# Patient Record
Sex: Female | Born: 1955
Health system: Southern US, Community
[De-identification: ages and names within clinical notes are randomized; demographics above are authoritative.]

## PROBLEM LIST (undated history)

## (undated) DIAGNOSIS — F419 Anxiety disorder, unspecified: Secondary | ICD-10-CM

## (undated) DIAGNOSIS — M797 Fibromyalgia: Secondary | ICD-10-CM

## (undated) DIAGNOSIS — F32A Depression, unspecified: Secondary | ICD-10-CM

## (undated) DIAGNOSIS — K759 Inflammatory liver disease, unspecified: Secondary | ICD-10-CM

## (undated) DIAGNOSIS — J189 Pneumonia, unspecified organism: Secondary | ICD-10-CM

## (undated) DIAGNOSIS — K219 Gastro-esophageal reflux disease without esophagitis: Secondary | ICD-10-CM

## (undated) DIAGNOSIS — M1990 Unspecified osteoarthritis, unspecified site: Secondary | ICD-10-CM

## (undated) DIAGNOSIS — J45909 Unspecified asthma, uncomplicated: Secondary | ICD-10-CM

## (undated) DIAGNOSIS — I10 Essential (primary) hypertension: Secondary | ICD-10-CM

## (undated) HISTORY — PX: TONSILLECTOMY: SUR1361

## (undated) HISTORY — PX: ANKLE SURGERY: SHX546

---

## 2013-11-27 ENCOUNTER — Other Ambulatory Visit: Payer: Self-pay | Admitting: *Deleted

## 2013-11-27 MED ORDER — SULFAMETHOXAZOLE-TMP DS 800-160 MG PO TABS: ORAL_TABLET | ORAL | Status: DC

## 2013-11-27 MED ORDER — MUPIROCIN 2 % EX OINT: TOPICAL_OINTMENT | CUTANEOUS | Status: DC

## 2013-11-27 MED ORDER — CHLORHEXIDINE GLUCONATE 4 % EX LIQD: CUTANEOUS | Status: DC

## 2014-01-27 ENCOUNTER — Encounter: Payer: Self-pay | Admitting: Internal Medicine

## 2015-06-04 DIAGNOSIS — M170 Bilateral primary osteoarthritis of knee: Secondary | ICD-10-CM | POA: Diagnosis not present

## 2015-06-04 DIAGNOSIS — M93271 Osteochondritis dissecans, right ankle and joints of right foot: Secondary | ICD-10-CM | POA: Diagnosis not present

## 2015-06-11 DIAGNOSIS — M170 Bilateral primary osteoarthritis of knee: Secondary | ICD-10-CM | POA: Diagnosis not present

## 2015-06-22 DIAGNOSIS — M170 Bilateral primary osteoarthritis of knee: Secondary | ICD-10-CM | POA: Diagnosis not present

## 2015-08-10 DIAGNOSIS — K080 Exfoliation of teeth due to systemic causes: Secondary | ICD-10-CM | POA: Diagnosis not present

## 2015-08-24 DIAGNOSIS — E785 Hyperlipidemia, unspecified: Secondary | ICD-10-CM | POA: Diagnosis not present

## 2015-09-07 DIAGNOSIS — F330 Major depressive disorder, recurrent, mild: Secondary | ICD-10-CM | POA: Diagnosis not present

## 2015-11-23 DIAGNOSIS — Z1231 Encounter for screening mammogram for malignant neoplasm of breast: Secondary | ICD-10-CM | POA: Diagnosis not present

## 2015-12-01 DIAGNOSIS — H1013 Acute atopic conjunctivitis, bilateral: Secondary | ICD-10-CM | POA: Diagnosis not present

## 2015-12-14 DIAGNOSIS — R928 Other abnormal and inconclusive findings on diagnostic imaging of breast: Secondary | ICD-10-CM | POA: Diagnosis not present

## 2015-12-14 DIAGNOSIS — R922 Inconclusive mammogram: Secondary | ICD-10-CM | POA: Diagnosis not present

## 2016-02-08 DIAGNOSIS — K080 Exfoliation of teeth due to systemic causes: Secondary | ICD-10-CM | POA: Diagnosis not present

## 2016-02-11 DIAGNOSIS — M25571 Pain in right ankle and joints of right foot: Secondary | ICD-10-CM | POA: Diagnosis not present

## 2016-02-11 DIAGNOSIS — M170 Bilateral primary osteoarthritis of knee: Secondary | ICD-10-CM | POA: Diagnosis not present

## 2016-02-26 DIAGNOSIS — S61219A Laceration without foreign body of unspecified finger without damage to nail, initial encounter: Secondary | ICD-10-CM | POA: Diagnosis not present

## 2016-03-22 ENCOUNTER — Encounter (HOSPITAL_COMMUNITY): Payer: Self-pay | Admitting: *Deleted

## 2016-03-22 ENCOUNTER — Ambulatory Visit (HOSPITAL_COMMUNITY)
Admission: EM | Admit: 2016-03-22 | Discharge: 2016-03-22 | Disposition: A | Discharge status: Home / Self Care | Payer: PRIVATE HEALTH INSURANCE | Attending: Family Medicine | Admitting: Family Medicine

## 2016-03-22 ENCOUNTER — Ambulatory Visit (INDEPENDENT_AMBULATORY_CARE_PROVIDER_SITE_OTHER): Payer: PRIVATE HEALTH INSURANCE

## 2016-03-22 DIAGNOSIS — T148XXA Other injury of unspecified body region, initial encounter: Secondary | ICD-10-CM | POA: Diagnosis not present

## 2016-03-22 DIAGNOSIS — S20212A Contusion of left front wall of thorax, initial encounter: Secondary | ICD-10-CM | POA: Diagnosis not present

## 2016-03-22 DIAGNOSIS — M791 Myalgia: Secondary | ICD-10-CM | POA: Diagnosis not present

## 2016-03-22 DIAGNOSIS — S39012A Strain of muscle, fascia and tendon of lower back, initial encounter: Secondary | ICD-10-CM

## 2016-03-22 DIAGNOSIS — M7918 Myalgia, other site: Secondary | ICD-10-CM

## 2016-03-22 MED ORDER — KETOROLAC TROMETHAMINE 30 MG/ML IJ SOLN
INTRAMUSCULAR | Status: AC
  Filled 2016-03-22: qty 2

## 2016-03-22 MED ORDER — KETOROLAC TROMETHAMINE 30 MG/ML IJ SOLN
45.0000 mg | Freq: Once | INTRAMUSCULAR | Status: AC
  Administered 2016-03-22: 45 mg via INTRAMUSCULAR
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
## 2016-03-25 DIAGNOSIS — M62838 Other muscle spasm: Secondary | ICD-10-CM | POA: Diagnosis not present

## 2016-03-25 DIAGNOSIS — R05 Cough: Secondary | ICD-10-CM | POA: Diagnosis not present

## 2016-06-06 DIAGNOSIS — M170 Bilateral primary osteoarthritis of knee: Secondary | ICD-10-CM | POA: Diagnosis not present

## 2016-06-13 DIAGNOSIS — M170 Bilateral primary osteoarthritis of knee: Secondary | ICD-10-CM | POA: Diagnosis not present

## 2016-06-20 DIAGNOSIS — M170 Bilateral primary osteoarthritis of knee: Secondary | ICD-10-CM | POA: Diagnosis not present

## 2016-07-04 DIAGNOSIS — M79671 Pain in right foot: Secondary | ICD-10-CM | POA: Diagnosis not present

## 2016-08-15 DIAGNOSIS — M170 Bilateral primary osteoarthritis of knee: Secondary | ICD-10-CM | POA: Diagnosis not present

## 2016-08-15 DIAGNOSIS — M25571 Pain in right ankle and joints of right foot: Secondary | ICD-10-CM | POA: Diagnosis not present

## 2016-08-15 DIAGNOSIS — K080 Exfoliation of teeth due to systemic causes: Secondary | ICD-10-CM | POA: Diagnosis not present

## 2016-08-29 DIAGNOSIS — F430 Acute stress reaction: Secondary | ICD-10-CM | POA: Diagnosis not present

## 2016-12-16 DIAGNOSIS — B029 Zoster without complications: Secondary | ICD-10-CM | POA: Diagnosis not present

## 2017-01-02 DIAGNOSIS — M1711 Unilateral primary osteoarthritis, right knee: Secondary | ICD-10-CM | POA: Diagnosis not present

## 2017-01-02 DIAGNOSIS — M1712 Unilateral primary osteoarthritis, left knee: Secondary | ICD-10-CM | POA: Diagnosis not present

## 2017-01-02 DIAGNOSIS — M170 Bilateral primary osteoarthritis of knee: Secondary | ICD-10-CM | POA: Diagnosis not present

## 2017-01-02 DIAGNOSIS — M25571 Pain in right ankle and joints of right foot: Secondary | ICD-10-CM | POA: Diagnosis not present

## 2017-01-09 DIAGNOSIS — M1712 Unilateral primary osteoarthritis, left knee: Secondary | ICD-10-CM | POA: Diagnosis not present

## 2017-01-09 DIAGNOSIS — M170 Bilateral primary osteoarthritis of knee: Secondary | ICD-10-CM | POA: Diagnosis not present

## 2017-01-09 DIAGNOSIS — M1711 Unilateral primary osteoarthritis, right knee: Secondary | ICD-10-CM | POA: Diagnosis not present

## 2017-01-16 DIAGNOSIS — M1712 Unilateral primary osteoarthritis, left knee: Secondary | ICD-10-CM | POA: Diagnosis not present

## 2017-01-16 DIAGNOSIS — M1711 Unilateral primary osteoarthritis, right knee: Secondary | ICD-10-CM | POA: Diagnosis not present

## 2017-01-16 DIAGNOSIS — M170 Bilateral primary osteoarthritis of knee: Secondary | ICD-10-CM | POA: Diagnosis not present

## 2017-02-20 DIAGNOSIS — K080 Exfoliation of teeth due to systemic causes: Secondary | ICD-10-CM | POA: Diagnosis not present

## 2017-02-20 DIAGNOSIS — Z1231 Encounter for screening mammogram for malignant neoplasm of breast: Secondary | ICD-10-CM | POA: Diagnosis not present

## 2017-05-11 DIAGNOSIS — M170 Bilateral primary osteoarthritis of knee: Secondary | ICD-10-CM | POA: Diagnosis not present

## 2017-05-11 DIAGNOSIS — M25571 Pain in right ankle and joints of right foot: Secondary | ICD-10-CM | POA: Diagnosis not present

## 2017-05-26 DIAGNOSIS — Z1211 Encounter for screening for malignant neoplasm of colon: Secondary | ICD-10-CM | POA: Diagnosis not present

## 2017-05-30 DIAGNOSIS — K080 Exfoliation of teeth due to systemic causes: Secondary | ICD-10-CM | POA: Diagnosis not present

## 2017-06-01 DIAGNOSIS — K080 Exfoliation of teeth due to systemic causes: Secondary | ICD-10-CM | POA: Diagnosis not present

## 2017-06-26 DIAGNOSIS — Z1211 Encounter for screening for malignant neoplasm of colon: Secondary | ICD-10-CM | POA: Diagnosis not present

## 2017-08-09 DIAGNOSIS — M170 Bilateral primary osteoarthritis of knee: Secondary | ICD-10-CM | POA: Diagnosis not present

## 2017-08-16 DIAGNOSIS — M170 Bilateral primary osteoarthritis of knee: Secondary | ICD-10-CM | POA: Diagnosis not present

## 2017-08-22 DIAGNOSIS — M170 Bilateral primary osteoarthritis of knee: Secondary | ICD-10-CM | POA: Diagnosis not present

## 2017-08-22 DIAGNOSIS — M25571 Pain in right ankle and joints of right foot: Secondary | ICD-10-CM | POA: Diagnosis not present

## 2017-10-25 DIAGNOSIS — K080 Exfoliation of teeth due to systemic causes: Secondary | ICD-10-CM | POA: Diagnosis not present

## 2017-11-20 ENCOUNTER — Ambulatory Visit: Payer: Federal, State, Local not specified - PPO | Admitting: Psychology

## 2017-11-20 DIAGNOSIS — F320 Major depressive disorder, single episode, mild: Secondary | ICD-10-CM

## 2017-12-04 ENCOUNTER — Ambulatory Visit: Payer: Federal, State, Local not specified - PPO | Admitting: Psychology

## 2017-12-04 DIAGNOSIS — F320 Major depressive disorder, single episode, mild: Secondary | ICD-10-CM | POA: Diagnosis not present

## 2017-12-18 ENCOUNTER — Ambulatory Visit: Payer: Federal, State, Local not specified - PPO | Admitting: Psychology

## 2017-12-18 DIAGNOSIS — F320 Major depressive disorder, single episode, mild: Secondary | ICD-10-CM

## 2017-12-25 DIAGNOSIS — M1711 Unilateral primary osteoarthritis, right knee: Secondary | ICD-10-CM | POA: Diagnosis not present

## 2017-12-25 DIAGNOSIS — M75101 Unspecified rotator cuff tear or rupture of right shoulder, not specified as traumatic: Secondary | ICD-10-CM | POA: Diagnosis not present

## 2018-01-16 DIAGNOSIS — M25571 Pain in right ankle and joints of right foot: Secondary | ICD-10-CM | POA: Diagnosis not present

## 2018-01-16 DIAGNOSIS — M1712 Unilateral primary osteoarthritis, left knee: Secondary | ICD-10-CM | POA: Diagnosis not present

## 2018-01-22 DIAGNOSIS — T300 Burn of unspecified body region, unspecified degree: Secondary | ICD-10-CM | POA: Diagnosis not present

## 2018-01-29 ENCOUNTER — Ambulatory Visit: Payer: Federal, State, Local not specified - PPO | Admitting: Psychology

## 2018-01-29 DIAGNOSIS — F320 Major depressive disorder, single episode, mild: Secondary | ICD-10-CM | POA: Diagnosis not present

## 2018-03-05 ENCOUNTER — Ambulatory Visit: Payer: Federal, State, Local not specified - PPO | Admitting: Psychology

## 2018-03-19 DIAGNOSIS — M1712 Unilateral primary osteoarthritis, left knee: Secondary | ICD-10-CM | POA: Diagnosis not present

## 2018-03-20 DIAGNOSIS — B354 Tinea corporis: Secondary | ICD-10-CM | POA: Diagnosis not present

## 2018-03-26 DIAGNOSIS — M170 Bilateral primary osteoarthritis of knee: Secondary | ICD-10-CM | POA: Diagnosis not present

## 2018-04-02 ENCOUNTER — Ambulatory Visit: Payer: Federal, State, Local not specified - PPO | Admitting: Psychology

## 2018-04-02 DIAGNOSIS — M170 Bilateral primary osteoarthritis of knee: Secondary | ICD-10-CM | POA: Diagnosis not present

## 2018-04-03 DIAGNOSIS — N649 Disorder of breast, unspecified: Secondary | ICD-10-CM | POA: Diagnosis not present

## 2018-04-03 DIAGNOSIS — L989 Disorder of the skin and subcutaneous tissue, unspecified: Secondary | ICD-10-CM | POA: Diagnosis not present

## 2018-04-14 DIAGNOSIS — J209 Acute bronchitis, unspecified: Secondary | ICD-10-CM | POA: Diagnosis not present

## 2018-04-16 DIAGNOSIS — M25571 Pain in right ankle and joints of right foot: Secondary | ICD-10-CM | POA: Diagnosis not present

## 2018-04-16 DIAGNOSIS — M25511 Pain in right shoulder: Secondary | ICD-10-CM | POA: Diagnosis not present

## 2018-04-30 ENCOUNTER — Ambulatory Visit: Payer: Federal, State, Local not specified - PPO | Admitting: Psychology

## 2018-04-30 DIAGNOSIS — R05 Cough: Secondary | ICD-10-CM | POA: Diagnosis not present

## 2018-05-14 DIAGNOSIS — M79671 Pain in right foot: Secondary | ICD-10-CM | POA: Diagnosis not present

## 2018-05-28 DIAGNOSIS — J301 Allergic rhinitis due to pollen: Secondary | ICD-10-CM | POA: Diagnosis not present

## 2018-05-28 DIAGNOSIS — J410 Simple chronic bronchitis: Secondary | ICD-10-CM | POA: Diagnosis not present

## 2018-06-04 ENCOUNTER — Ambulatory Visit: Payer: Federal, State, Local not specified - PPO | Admitting: Psychology

## 2018-06-11 ENCOUNTER — Ambulatory Visit: Payer: Federal, State, Local not specified - PPO | Admitting: Psychology

## 2018-07-13 DIAGNOSIS — F419 Anxiety disorder, unspecified: Secondary | ICD-10-CM | POA: Diagnosis not present

## 2018-07-26 DIAGNOSIS — M25562 Pain in left knee: Secondary | ICD-10-CM | POA: Diagnosis not present

## 2018-07-26 DIAGNOSIS — M25561 Pain in right knee: Secondary | ICD-10-CM | POA: Diagnosis not present

## 2018-08-13 DIAGNOSIS — M25571 Pain in right ankle and joints of right foot: Secondary | ICD-10-CM | POA: Diagnosis not present

## 2018-08-13 DIAGNOSIS — M25511 Pain in right shoulder: Secondary | ICD-10-CM | POA: Diagnosis not present

## 2018-08-30 DIAGNOSIS — M170 Bilateral primary osteoarthritis of knee: Secondary | ICD-10-CM | POA: Diagnosis not present

## 2018-10-08 DIAGNOSIS — K080 Exfoliation of teeth due to systemic causes: Secondary | ICD-10-CM | POA: Diagnosis not present

## 2018-10-15 DIAGNOSIS — M170 Bilateral primary osteoarthritis of knee: Secondary | ICD-10-CM | POA: Diagnosis not present

## 2018-10-22 DIAGNOSIS — M170 Bilateral primary osteoarthritis of knee: Secondary | ICD-10-CM | POA: Diagnosis not present

## 2018-10-30 DIAGNOSIS — M170 Bilateral primary osteoarthritis of knee: Secondary | ICD-10-CM | POA: Diagnosis not present

## 2018-10-30 DIAGNOSIS — M1711 Unilateral primary osteoarthritis, right knee: Secondary | ICD-10-CM | POA: Diagnosis not present

## 2018-11-23 DIAGNOSIS — M19011 Primary osteoarthritis, right shoulder: Secondary | ICD-10-CM | POA: Diagnosis not present

## 2019-01-28 DIAGNOSIS — M19071 Primary osteoarthritis, right ankle and foot: Secondary | ICD-10-CM | POA: Diagnosis not present

## 2019-04-12 DIAGNOSIS — J45909 Unspecified asthma, uncomplicated: Secondary | ICD-10-CM | POA: Diagnosis not present

## 2019-04-12 DIAGNOSIS — H9209 Otalgia, unspecified ear: Secondary | ICD-10-CM | POA: Diagnosis not present

## 2019-04-12 DIAGNOSIS — M26609 Unspecified temporomandibular joint disorder, unspecified side: Secondary | ICD-10-CM | POA: Diagnosis not present

## 2019-04-12 DIAGNOSIS — F330 Major depressive disorder, recurrent, mild: Secondary | ICD-10-CM | POA: Diagnosis not present

## 2019-05-17 DIAGNOSIS — M19011 Primary osteoarthritis, right shoulder: Secondary | ICD-10-CM | POA: Diagnosis not present

## 2019-05-31 DIAGNOSIS — M170 Bilateral primary osteoarthritis of knee: Secondary | ICD-10-CM | POA: Diagnosis not present

## 2019-06-18 DIAGNOSIS — M170 Bilateral primary osteoarthritis of knee: Secondary | ICD-10-CM | POA: Diagnosis not present

## 2019-06-24 DIAGNOSIS — Z1231 Encounter for screening mammogram for malignant neoplasm of breast: Secondary | ICD-10-CM | POA: Diagnosis not present

## 2019-06-26 DIAGNOSIS — M170 Bilateral primary osteoarthritis of knee: Secondary | ICD-10-CM | POA: Diagnosis not present

## 2019-07-03 DIAGNOSIS — M170 Bilateral primary osteoarthritis of knee: Secondary | ICD-10-CM | POA: Diagnosis not present

## 2019-10-17 DIAGNOSIS — J0100 Acute maxillary sinusitis, unspecified: Secondary | ICD-10-CM | POA: Diagnosis not present

## 2019-11-11 DIAGNOSIS — Z5181 Encounter for therapeutic drug level monitoring: Secondary | ICD-10-CM | POA: Diagnosis not present

## 2019-11-11 DIAGNOSIS — E785 Hyperlipidemia, unspecified: Secondary | ICD-10-CM | POA: Diagnosis not present

## 2019-11-18 DIAGNOSIS — Z0000 Encounter for general adult medical examination without abnormal findings: Secondary | ICD-10-CM | POA: Diagnosis not present

## 2019-11-18 DIAGNOSIS — Z23 Encounter for immunization: Secondary | ICD-10-CM | POA: Diagnosis not present

## 2019-12-02 DIAGNOSIS — D225 Melanocytic nevi of trunk: Secondary | ICD-10-CM | POA: Diagnosis not present

## 2019-12-02 DIAGNOSIS — D2239 Melanocytic nevi of other parts of face: Secondary | ICD-10-CM | POA: Diagnosis not present

## 2019-12-02 DIAGNOSIS — D485 Neoplasm of uncertain behavior of skin: Secondary | ICD-10-CM | POA: Diagnosis not present

## 2019-12-02 DIAGNOSIS — D1801 Hemangioma of skin and subcutaneous tissue: Secondary | ICD-10-CM | POA: Diagnosis not present

## 2019-12-05 DIAGNOSIS — M170 Bilateral primary osteoarthritis of knee: Secondary | ICD-10-CM | POA: Diagnosis not present

## 2019-12-23 DIAGNOSIS — M19011 Primary osteoarthritis, right shoulder: Secondary | ICD-10-CM | POA: Diagnosis not present

## 2020-01-02 DIAGNOSIS — M170 Bilateral primary osteoarthritis of knee: Secondary | ICD-10-CM | POA: Diagnosis not present

## 2020-03-10 DIAGNOSIS — M170 Bilateral primary osteoarthritis of knee: Secondary | ICD-10-CM | POA: Diagnosis not present

## 2020-03-16 DIAGNOSIS — M170 Bilateral primary osteoarthritis of knee: Secondary | ICD-10-CM | POA: Diagnosis not present

## 2020-03-23 DIAGNOSIS — M170 Bilateral primary osteoarthritis of knee: Secondary | ICD-10-CM | POA: Diagnosis not present

## 2020-04-24 DIAGNOSIS — F331 Major depressive disorder, recurrent, moderate: Secondary | ICD-10-CM | POA: Diagnosis not present

## 2020-04-24 DIAGNOSIS — J410 Simple chronic bronchitis: Secondary | ICD-10-CM | POA: Diagnosis not present

## 2020-04-24 DIAGNOSIS — I10 Essential (primary) hypertension: Secondary | ICD-10-CM | POA: Diagnosis not present

## 2020-05-15 DIAGNOSIS — R21 Rash and other nonspecific skin eruption: Secondary | ICD-10-CM | POA: Diagnosis not present

## 2020-05-15 DIAGNOSIS — I10 Essential (primary) hypertension: Secondary | ICD-10-CM | POA: Diagnosis not present

## 2020-05-29 DIAGNOSIS — Z01818 Encounter for other preprocedural examination: Secondary | ICD-10-CM | POA: Diagnosis not present

## 2020-05-29 DIAGNOSIS — I10 Essential (primary) hypertension: Secondary | ICD-10-CM | POA: Diagnosis not present

## 2020-05-29 DIAGNOSIS — M25861 Other specified joint disorders, right knee: Secondary | ICD-10-CM | POA: Diagnosis not present

## 2020-05-29 DIAGNOSIS — E785 Hyperlipidemia, unspecified: Secondary | ICD-10-CM | POA: Diagnosis not present

## 2020-05-29 DIAGNOSIS — F331 Major depressive disorder, recurrent, moderate: Secondary | ICD-10-CM | POA: Diagnosis not present

## 2020-05-29 DIAGNOSIS — M1990 Unspecified osteoarthritis, unspecified site: Secondary | ICD-10-CM | POA: Diagnosis not present

## 2020-05-29 DIAGNOSIS — Z5181 Encounter for therapeutic drug level monitoring: Secondary | ICD-10-CM | POA: Diagnosis not present

## 2020-06-22 DIAGNOSIS — M19011 Primary osteoarthritis, right shoulder: Secondary | ICD-10-CM | POA: Diagnosis not present

## 2020-07-02 DIAGNOSIS — K649 Unspecified hemorrhoids: Secondary | ICD-10-CM | POA: Diagnosis not present

## 2020-07-02 DIAGNOSIS — K602 Anal fissure, unspecified: Secondary | ICD-10-CM | POA: Diagnosis not present

## 2020-07-06 DIAGNOSIS — M1711 Unilateral primary osteoarthritis, right knee: Secondary | ICD-10-CM | POA: Diagnosis not present

## 2020-07-06 DIAGNOSIS — Z1231 Encounter for screening mammogram for malignant neoplasm of breast: Secondary | ICD-10-CM | POA: Diagnosis not present

## 2020-07-17 NOTE — Progress Notes (Addendum)
PCP - Shirlean Mylar , MD Cardiologist - no  PPM/ICD -  Device Orders -  Rep Notified -   Chest x-ray -  EKG -  Stress Test -  ECHO -  Cardiac Cath -   Sleep Study -  CPAP -   Fasting Blood Sugar -  Checks Blood Sugar _____ times a day  Blood Thinner Instructions: Aspirin Instructions:  ERAS Protcol - PRE-SURGERY Ensure or G2-   COVID TEST- 07-30-20  Activity---Works out in pool 45 minutes without Co or SOB Anesthesia review: HTN, phentermine last dose 6-5  Patient denies shortness of breath, fever, cough and chest pain at PAT appointment   All instructions explained to the patient, with a verbal understanding of the material. Patient agrees to go over the instructions while at home for a better understanding. Patient also instructed to self quarantine after being tested for COVID-19. The opportunity to ask questions was provided.

## 2020-07-17 NOTE — Patient Instructions (Addendum)
DUE TO COVID-19 ONLY ONE VISITOR IS ALLOWED TO COME WITH YOU AND STAY IN THE WAITING ROOM ONLY DURING PRE OP AND PROCEDURE DAY OF SURGERY.   TWO VISITOR  MAY VISIT WITH YOU AFTER SURGERY IN YOUR PRIVATE ROOM DURING VISITING HOURS ONLY!  YOU NEED TO HAVE A COVID 19 TEST ON__6-9-22_____ @_______ , THIS TEST MUST BE DONE BEFORE SURGERY,  COVID TESTING SITE [address] [place] , IT IS ON THE RIGHT GOING OUT [address] APPROXIMATELY  2 MINUTES PAST [employer] ON THE RIGHT. ONCE YOUR COVID TEST IS COMPLETED,  PLEASE BEGIN THE QUARANTINE INSTRUCTIONS AS OUTLINED IN YOUR HANDOUT.                Jeanette Rodriguez  07/17/2020   Your procedure is scheduled on: 08-03-20   Report to [facility] Main  Entrance   Report to admitting at      0550 AM     Call this number if you have problems the morning of surgery [phone]    Remember: NO SOLID FOOD AFTER MIDNIGHT THE NIGHT PRIOR TO SURGERY. NOTHING BY MOUTH EXCEPT CLEAR LIQUIDS UNTIL     0520 am    .   PLEASE FINISH ENSURE DRINK PER SURGEON ORDER  WHICH NEEDS TO BE COMPLETED AT       0520 am then nothing by mouth.      CLEAR LIQUID DIET   Foods Allowed                                                                                                Foods Excluded  Black Coffee and tea, regular and decaf                                                liquids that you cannot  Plain Jell-O any favor except red or purple                                                        see through such as: Fruit ices (not with fruit pulp)                                                                  milk, soups, orange juice  Iced Popsicles  All solid food Carbonated beverages, regular and diet                                    Cranberry, grape and apple juices Sports drinks like Gatorade Lightly seasoned clear broth or consume(fat free) Sugar, honey  syrup   _____________________________________________________________________    BRUSH YOUR TEETH MORNING OF SURGERY AND RINSE YOUR MOUTH OUT, NO CHEWING GUM CANDY OR MINTS.     Take these medicines the morning of surgery with A SIP OF WATER: nebulizer if needed, alprazolam                                 You may not have any metal on your body including hair pins and              piercings  Do not wear jewelry, make-up, lotions, powders or perfumes, deodorant             Do not wear nail polish on your fingernails.  Do not shave  48 hours prior to surgery.     Do not bring valuables to the hospital. [facility] IS NOT             RESPONSIBLE   FOR VALUABLES.  Contacts, dentures or bridgework may not be worn into surgery.       Patients discharged the day of surgery will not be allowed to drive home. IF YOU ARE HAVING SURGERY AND GOING HOME THE SAME DAY, YOU MUST HAVE AN ADULT TO DRIVE YOU HOME AND BE WITH YOU FOR 24 HOURS. YOU MAY GO HOME BY TAXI OR UBER OR ORTHERWISE, BUT AN ADULT MUST ACCOMPANY YOU HOME AND STAY WITH YOU FOR 24 HOURS.  Name and phone number of your driver:  Special Instructions: N/A              Please read over the following fact sheets you were given: _____________________________________________________________________             [facility] Before surgery, you can play an important role.  Because skin is not sterile, your skin needs to be as free of germs as possible.  You can reduce the number of germs on your skin by washing with CHG (chlorahexidine gluconate) soap before surgery.  CHG is an antiseptic cleaner which kills germs and bonds with the skin to continue killing germs even after washing. Please DO NOT use if you have an allergy to CHG or antibacterial soaps.  If your skin becomes reddened/irritated stop using the CHG and inform your nurse when you arrive at Short Stay. Do not shave (including legs and underarms) for at  least 48 hours prior to the first CHG shower.  You may shave your face/neck. Please follow these instructions carefully:  1.  Shower with CHG Soap the night before surgery and the  morning of Surgery.  2.  If you choose to wash your hair, wash your hair first as usual with your  normal  shampoo.  3.  After you shampoo, rinse your hair and body thoroughly to remove the  shampoo.                           4.  Use CHG as you would any other liquid soap.  You can apply  chg directly  to the skin and wash                       Gently with a scrungie or clean washcloth.  5.  Apply the CHG Soap to your body ONLY FROM THE NECK DOWN.   Do not use on face/ open                           Wound or open sores. Avoid contact with eyes, ears mouth and genitals (private parts).                       Wash face,  Genitals (private parts) with your normal soap.             6.  Wash thoroughly, paying special attention to the area where your surgery  will be performed.  7.  Thoroughly rinse your body with warm water from the neck down.  8.  DO NOT shower/wash with your normal soap after using and rinsing off  the CHG Soap.                9.  Pat yourself dry with a clean towel.            10.  Wear clean pajamas.            11.  Place clean sheets on your bed the night of your first shower and do not  sleep with pets. Day of Surgery : Do not apply any lotions/deodorants the morning of surgery.  Please wear clean clothes to the hospital/surgery center.  FAILURE TO FOLLOW THESE INSTRUCTIONS MAY RESULT IN THE CANCELLATION OF YOUR SURGERY PATIENT SIGNATURE_________________________________  NURSE SIGNATURE__________________________________  ________________________________________________________________________   Jeanette Rodriguez  An incentive spirometer is a tool that can help keep your lungs clear and active. This tool measures how well you are filling your lungs with each breath. Taking long deep breaths  may help reverse or decrease the chance of developing breathing (pulmonary) problems (especially infection) following:  A long period of time when you are unable to move or be active. BEFORE THE PROCEDURE   If the spirometer includes an indicator to show your best effort, your nurse or respiratory therapist will set it to a desired goal.  If possible, sit up straight or lean slightly forward. Try not to slouch.  Hold the incentive spirometer in an upright position. INSTRUCTIONS FOR USE  1. Sit on the edge of your bed if possible, or sit up as far as you can in bed or on a chair. 2. Hold the incentive spirometer in an upright position. 3. Breathe out normally. 4. Place the mouthpiece in your mouth and seal your lips tightly around it. 5. Breathe in slowly and as deeply as possible, raising the piston or the ball toward the top of the column. 6. Hold your breath for 3-5 seconds or for as long as possible. Allow the piston or ball to fall to the bottom of the column. 7. Remove the mouthpiece from your mouth and breathe out normally. 8. Rest for a few seconds and repeat Steps 1 through 7 at least 10 times every 1-2 hours when you are awake. Take your time and take a few normal breaths between deep breaths. 9. The spirometer may include an indicator to show your best effort. Use the indicator as a goal to work toward during each repetition.  10. After each set of 10 deep breaths, practice coughing to be sure your lungs are clear. If you have an incision (the cut made at the time of surgery), support your incision when coughing by placing a pillow or rolled up towels firmly against it. Once you are able to get out of bed, walk around indoors and cough well. You may stop using the incentive spirometer when instructed by your caregiver.  RISKS AND COMPLICATIONS  Take your time so you do not get dizzy or light-headed.  If you are in pain, you may need to take or ask for pain medication before doing  incentive spirometry. It is harder to take a deep breath if you are having pain. AFTER USE  Rest and breathe slowly and easily.  It can be helpful to keep track of a log of your progress. Your caregiver can provide you with a simple table to help with this. If you are using the spirometer at home, follow these instructions: SEEK MEDICAL CARE IF:   You are having difficultly using the spirometer.  You have trouble using the spirometer as often as instructed.  Your pain medication is not giving enough relief while using the spirometer.  You develop fever of 100.5 F (38.1 C) or higher. SEEK IMMEDIATE MEDICAL CARE IF:   You cough up bloody sputum that had not been present before.  You develop fever of 102 F (38.9 C) or greater.  You develop worsening pain at or near the incision site. MAKE SURE YOU:   Understand these instructions.  Will watch your condition.  Will get help right away if you are not doing well or get worse. Document Released: 06/20/2006 Document Revised: 05/02/2011 Document Reviewed: 08/21/2006 [facility] Patient Information 2014 [place], [place].   ________________________________________________________________________

## 2020-07-27 ENCOUNTER — Encounter (HOSPITAL_COMMUNITY)
Admission: RE | Admit: 2020-07-27 | Discharge: 2020-07-27 | Disposition: A | Discharge status: Home / Self Care | Payer: Medicare Other | Source: Ambulatory Visit | Attending: Orthopedic Surgery | Admitting: Orthopedic Surgery

## 2020-07-27 ENCOUNTER — Encounter (HOSPITAL_COMMUNITY): Payer: Self-pay

## 2020-07-27 ENCOUNTER — Other Ambulatory Visit: Payer: Self-pay

## 2020-07-27 DIAGNOSIS — Z01818 Encounter for other preprocedural examination: Secondary | ICD-10-CM | POA: Diagnosis not present

## 2020-07-27 HISTORY — DX: Inflammatory liver disease, unspecified: K75.9

## 2020-07-27 HISTORY — DX: Essential (primary) hypertension: I10

## 2020-07-27 HISTORY — DX: Gastro-esophageal reflux disease without esophagitis: K21.9

## 2020-07-27 HISTORY — DX: Unspecified asthma, uncomplicated: J45.909

## 2020-07-27 HISTORY — DX: Pneumonia, unspecified organism: J18.9

## 2020-07-27 HISTORY — DX: Anxiety disorder, unspecified: F41.9

## 2020-07-27 HISTORY — DX: Unspecified osteoarthritis, unspecified site: M19.90

## 2020-07-27 LAB — COMPREHENSIVE METABOLIC PANEL
ALT: 22 U/L (ref 0–44)
AST: 18 U/L (ref 15–41)
Albumin: 4.4 g/dL (ref 3.5–5.0)
Alkaline Phosphatase: 56 U/L (ref 38–126)
Anion gap: 11 (ref 5–15)
BUN: 13 mg/dL (ref 8–23)
CO2: 28 mmol/L (ref 22–32)
Calcium: 9.5 mg/dL (ref 8.9–10.3)
Chloride: 100 mmol/L (ref 98–111)
Creatinine, Ser: 0.85 mg/dL (ref 0.44–1.00)
GFR, Estimated: >60 mL/min (ref >60)
Glucose, Bld: 107 mg/dL — ABNORMAL HIGH (ref 70–99)
Potassium: 4 mmol/L (ref 3.5–5.1)
Sodium: 139 mmol/L (ref 135–145)
Total Bilirubin: 0.7 mg/dL (ref 0.3–1.2)
Total Protein: 7.3 g/dL (ref 6.5–8.1)

## 2020-07-27 LAB — CBC
HCT: 42.9 % (ref 36.0–46.0)
Hemoglobin: 14.1 g/dL (ref 12.0–15.0)
MCH: 29.9 pg (ref 26.0–34.0)
MCHC: 32.9 g/dL (ref 30.0–36.0)
MCV: 90.9 fL (ref 80.0–100.0)
Platelets: 365 10*3/uL (ref 150–400)
RBC: 4.72 MIL/uL (ref 3.87–5.11)
RDW: 13.8 % (ref 11.5–15.5)
WBC: 7.5 10*3/uL (ref 4.0–10.5)
nRBC: 0 % (ref 0.0–0.2)

## 2020-07-27 LAB — SURGICAL PCR SCREEN
MRSA, PCR: NEGATIVE
Staphylococcus aureus: NEGATIVE

## 2020-07-27 LAB — PROTIME-INR
INR: 1 (ref 0.8–1.2)
Prothrombin Time: 12.7 seconds (ref 11.4–15.2)

## 2020-07-27 LAB — APTT: aPTT: 33 seconds (ref 24–36)

## 2020-07-30 ENCOUNTER — Other Ambulatory Visit (HOSPITAL_COMMUNITY)
Admission: RE | Admit: 2020-07-30 | Discharge: 2020-07-30 | Disposition: A | Discharge status: Home / Self Care | Payer: Medicare Other | Source: Ambulatory Visit | Attending: Orthopedic Surgery | Admitting: Orthopedic Surgery

## 2020-07-30 DIAGNOSIS — Z20822 Contact with and (suspected) exposure to covid-19: Secondary | ICD-10-CM | POA: Diagnosis not present

## 2020-07-30 DIAGNOSIS — Z01812 Encounter for preprocedural laboratory examination: Secondary | ICD-10-CM | POA: Insufficient documentation

## 2020-07-30 LAB — SARS CORONAVIRUS 2 (TAT 6-24 HRS): SARS Coronavirus 2: NEGATIVE

## 2020-08-01 NOTE — Anesthesia Preprocedure Evaluation (Addendum)
Anesthesia Evaluation  Patient identified by MRN, date of birth, ID band Patient awake    Reviewed: Allergy & Precautions, NPO status , Patient's Chart, lab work & pertinent test results  History of Anesthesia Complications Negative for: history of anesthetic complications  Airway Mallampati: II  TM Distance: >3 FB Neck ROM: Full    Dental  (+) Teeth Intact   Pulmonary asthma , former smoker,    Pulmonary exam normal        Cardiovascular hypertension, Pt. on medications Normal cardiovascular exam     Neuro/Psych Anxiety negative neurological ROS     GI/Hepatic Neg liver ROS, GERD  ,  Endo/Other  negative endocrine ROS  Renal/GU negative Renal ROS  negative genitourinary   Musculoskeletal negative musculoskeletal ROS (+)   Abdominal   Peds  Hematology negative hematology ROS (+)   Anesthesia Other Findings  Hgb 14.1, Plts 365, no AC  Reproductive/Obstetrics                            Anesthesia Physical Anesthesia Plan  ASA: 2  Anesthesia Plan: Spinal   Post-op Pain Management:  [facility] for Post-op pain   Induction:   PONV Risk Score and Plan: 2 and Propofol infusion, Treatment may vary due to age or medical condition, Ondansetron and TIVA  Airway Management Planned: Nasal Cannula and Simple Face Mask  Additional Equipment: None  Intra-op Plan:   Post-operative Plan:   Informed Consent: I have reviewed the patients History and Physical, chart, labs and discussed the procedure including the risks, benefits and alternatives for the proposed anesthesia with the patient or authorized representative who has indicated his/her understanding and acceptance.       Plan Discussed with:   Anesthesia Plan Comments:        Anesthesia Quick Evaluation

## 2020-08-02 MED ORDER — BUPIVACAINE LIPOSOME 1.3 % IJ SUSP
20.0000 mL | Freq: Once | INTRAMUSCULAR | Status: DC
  Filled 2020-08-02: qty 20

## 2020-08-02 NOTE — H&P (Addendum)
TOTAL KNEE ADMISSION H&P  Patient is being admitted for right total knee arthroplasty.  Subjective:  Chief Complaint: Right knee pain.  HPI: Jeanette Rodriguez, 65 y.o. female has a history of pain and functional disability in the right knee due to arthritis and has failed non-surgical conservative treatments for greater than 12 weeks to include NSAID's and/or analgesics and activity modification. Onset of symptoms was gradual, starting  several  years ago with gradually worsening course since that time. The patient noted no past surgery on the right knee.  Patient currently rates pain in the right knee at 7 out of 10 with activity. Patient has worsening of pain with activity and weight bearing and pain that interferes with activities of daily living. Patient has evidence of periarticular osteophytes and joint space narrowing by imaging studies. There is no active infection.  There are no problems to display for this patient.   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 Authorizing Provider  albuterol (PROVENTIL) (2.5 MG/3ML) 0.083% nebulizer solution Take 2.5 mg by nebulization every 6 (six) hours as needed for wheezing or shortness of breath.   Yes [provider]  azelastine (ASTELIN) 0.1 % nasal spray Place 1 spray into both nostrils 2 (two) times daily as needed for rhinitis or allergies. Use in each nostril as directed   Yes [provider]  calcium carbonate (OSCAL) 1500 (600 Ca) MG TABS tablet Take 600 mg of elemental calcium by mouth daily with breakfast.   Yes [provider]   cholecalciferol (VITAMIN D3) 25 MCG (1000 UNIT) tablet Take 1,000 Units by mouth daily.   Yes [provider]  clotrimazole-betamethasone (LOTRISONE) cream Apply 1 application topically 2 (two) times daily as needed (irritation).   Yes [provider]  cyclobenzaprine (FLEXERIL) 10 MG tablet Take 10 mg by mouth at bedtime.   Yes [provider]  hydrochlorothiazide (HYDRODIURIL) 25 MG tablet Take 25 mg by mouth daily.   Yes [provider]  indomethacin (INDOCIN) 50 MG capsule Take 50 mg by mouth 3 (three) times daily with meals.   Yes [provider]  montelukast (SINGULAIR) 10 MG tablet Take 10 mg by mouth at bedtime.   Yes [provider]  ALPRAZolam (XANAX) 0.25 MG tablet Take 0.25 mg by mouth 2 (two) times daily as needed for anxiety.    [provider]  buPROPion (WELLBUTRIN XL) 300 MG 24 hr tablet Take 300 mg by mouth daily.    [provider]  Magnesium 250 MG TABS Take 250 mg by mouth daily.    [provider]  oxyCODONE-acetaminophen (PERCOCET) 10-325 MG tablet Take 1 tablet by mouth every 8 (eight) hours as needed for pain.    [provider]  phentermine 37.5 MG capsule Take 37.5 mg by mouth every morning. Last dose 07-26-20    [provider]    Allergies  Allergen Reactions   Levaquin [Levofloxacin]     Leg swelling    Social History   Socioeconomic  History   Marital status: Married    Spouse name: Not on file   Number of children: Not on file   Years of education: Not on file   Highest education level: Not on file  Occupational History   Not on file  Tobacco Use   Smoking status: Former    Years: 10.00    Pack years: 0.00    Types: Cigarettes   Smokeless tobacco: Never   Tobacco comments:    quit 2015  Vaping Use   Vaping Use: Never used  Substance and Sexual Activity   Alcohol use: No    Comment: rarely   Drug use: Never   Sexual activity: Not Currently  Other  Topics Concern   Not on file  Social History Narrative   Not on file   Social Determinants of Health   Financial Resource Strain: Not on file  Food Insecurity: Not on file  Transportation Needs: Not on file  Physical Activity: Not on file  Stress: Not on file  Social Connections: Not on file  Intimate Partner Violence: Not on file    Tobacco Use: Medium Risk   Smoking Tobacco Use: Former   Smokeless Tobacco Use: Never   Social History   Substance and Sexual Activity  Alcohol Use No   Comment: rarely    No family history on file.  ROS: Constitutional: no fever, no chills, no night sweats, no significant weight loss Cardiovascular: no chest pain, no palpitations Respiratory: no cough, no shortness of breath, No COPD Gastrointestinal: no vomiting, no nausea Musculoskeletal: no swelling in Joints, Joint Pain Neurologic: no numbness, no tingling, no difficulty with balance   Objective:  Physical Exam: Well nourished and well developed.  General: Alert and oriented x3, cooperative and pleasant, no acute distress.  Head: normocephalic, atraumatic, neck supple.  Eyes: EOMI.  Respiratory: breath sounds clear in all fields, no wheezing, rales, or rhonchi. Cardiovascular: Regular rate and rhythm, no murmurs, gallops or rubs.  Abdomen: non-tender to palpation and soft, normoactive bowel sounds. Musculoskeletal: Right Hip Exam:  The range of motion: normal without discomfort.    Left Hip Exam:  The range of motion: normal without discomfort.    Right Knee Exam:  No effusion present. No swelling present.  The range of motion is: 0 to 120 degrees.  Marked crepitus on range of motion of the knee.  Positive medial joint line tenderness.  No lateral joint line tenderness.  The knee is stable.    Left Knee Exam:  No effusion present. No swelling present.  The Range of motion is: 0 to 125 or 130 degrees.  Slight crepitus on range of motion of the knee.  Slight medial  joint line tenderness.  No Lateral joint line tenderness.  The knee is stable.    The patient's sensation and motor function are intact in their lower extremities. Their distal pulses are 2+. The bilateral calves are soft and non-tender.      Vital signs in last 24 hours:    Imaging Review AP and lateral of the bilateral knees dated 12/05/2019 demonstrate severe bone-on-bone arthritis in the medial and patellofemoral compartments of the bilateral knees.   Assessment/Plan:  End stage arthritis, right knee   The patient history, physical examination, clinical judgment of the provider and imaging studies are consistent with end stage degenerative joint disease of the right knee and total knee arthroplasty is deemed medically necessary. The treatment options including medical management, injection therapy arthroscopy and arthroplasty were discussed  at length. The risks and benefits of total knee arthroplasty were presented and reviewed. The risks due to aseptic loosening, infection, stiffness, patella tracking problems, thromboembolic complications and other imponderables were discussed. The patient acknowledged the explanation, agreed to proceed with the plan and consent was signed. Patient is being admitted for inpatient treatment for surgery, pain control, PT, OT, prophylactic antibiotics, VTE prophylaxis, progressive ambulation and ADLs and discharge planning. The patient is planning to be discharged  home .   Patient's anticipated LOS is less than 2 midnights, meeting these requirements: - Lives within 1 hour of care - Has a competent adult at home to recover with post-op recovery - NO history of  - Chronic pain requiring opioids  - Diabetes  - Coronary Artery Disease  - Heart failure  - Heart attack  - Stroke  - DVT/VTE  - Cardiac arrhythmia  - Respiratory Failure/COPD  - Renal failure  - Anemia  - Advanced Liver disease  Therapy Plans: [facility] Disposition: Home with spouse Planned DVT Prophylaxis: Aspirin 325 mg DME Needed: Levan Hurst PCP: Shirlean Mylar, MD (clearance received) TXA: IV Allergies: NKDA Anesthesia Concerns: None BMI: 38.2 Last HgbA1c: N/A  Pharmacy: [facility] [address]  Other: Patient requesting anti-anxiety medicines preoperatively morning of surgery.  MRSA positive in 2015.      - Patient was instructed on what medications to stop prior to surgery. - Follow-up visit in 2 weeks with Dr. Lequita Halt - Begin physical therapy following surgery - Pre-operative lab work as pre-surgical testing - Prescriptions will be provided in hospital at time of discharge  Nelia Shi, [facility], PA-C Orthopedic Surgery [facility]

## 2020-08-03 ENCOUNTER — Encounter (HOSPITAL_COMMUNITY)
Admission: RE | Disposition: A | Discharge status: Home / Self Care | Payer: Self-pay | Source: Home / Self Care | Attending: Orthopedic Surgery

## 2020-08-03 ENCOUNTER — Encounter (HOSPITAL_COMMUNITY): Payer: Self-pay | Admitting: Orthopedic Surgery

## 2020-08-03 ENCOUNTER — Ambulatory Visit (HOSPITAL_COMMUNITY): Payer: Medicare Other | Admitting: Anesthesiology

## 2020-08-03 ENCOUNTER — Other Ambulatory Visit: Payer: Self-pay

## 2020-08-03 ENCOUNTER — Inpatient Hospital Stay (HOSPITAL_COMMUNITY)
Admission: RE | Admit: 2020-08-03 | Discharge: 2020-08-06 | DRG: 470 | Disposition: A | Discharge status: Home / Self Care | Payer: Medicare Other | Attending: Orthopedic Surgery | Admitting: Orthopedic Surgery

## 2020-08-03 DIAGNOSIS — I10 Essential (primary) hypertension: Secondary | ICD-10-CM | POA: Diagnosis not present

## 2020-08-03 DIAGNOSIS — M1710 Unilateral primary osteoarthritis, unspecified knee: Secondary | ICD-10-CM | POA: Diagnosis present

## 2020-08-03 DIAGNOSIS — K219 Gastro-esophageal reflux disease without esophagitis: Secondary | ICD-10-CM | POA: Diagnosis present

## 2020-08-03 DIAGNOSIS — Z8614 Personal history of Methicillin resistant Staphylococcus aureus infection: Secondary | ICD-10-CM | POA: Diagnosis not present

## 2020-08-03 DIAGNOSIS — F419 Anxiety disorder, unspecified: Secondary | ICD-10-CM | POA: Diagnosis not present

## 2020-08-03 DIAGNOSIS — Z79899 Other long term (current) drug therapy: Secondary | ICD-10-CM | POA: Diagnosis not present

## 2020-08-03 DIAGNOSIS — M25761 Osteophyte, right knee: Secondary | ICD-10-CM | POA: Diagnosis not present

## 2020-08-03 DIAGNOSIS — Z881 Allergy status to other antibiotic agents status: Secondary | ICD-10-CM

## 2020-08-03 DIAGNOSIS — M1711 Unilateral primary osteoarthritis, right knee: Secondary | ICD-10-CM | POA: Diagnosis not present

## 2020-08-03 DIAGNOSIS — G8918 Other acute postprocedural pain: Secondary | ICD-10-CM | POA: Diagnosis not present

## 2020-08-03 DIAGNOSIS — M179 Osteoarthritis of knee, unspecified: Secondary | ICD-10-CM | POA: Diagnosis present

## 2020-08-03 DIAGNOSIS — Z87891 Personal history of nicotine dependence: Secondary | ICD-10-CM | POA: Diagnosis not present

## 2020-08-03 HISTORY — PX: TOTAL KNEE ARTHROPLASTY: SHX125

## 2020-08-03 LAB — TYPE AND SCREEN
ABO/RH(D): A NEG
Antibody Screen: NEGATIVE

## 2020-08-03 LAB — ABO/RH: ABO/RH(D): A NEG

## 2020-08-03 SURGERY — ARTHROPLASTY, KNEE, TOTAL
Anesthesia: Spinal | Site: Knee | Laterality: Right

## 2020-08-03 MED ORDER — LACTATED RINGERS IV SOLN: INTRAVENOUS | Status: DC

## 2020-08-03 MED ORDER — PHENOL 1.4 % MT LIQD: 1.0000 | OROMUCOSAL | Status: DC | PRN

## 2020-08-03 MED ORDER — PROPOFOL 10 MG/ML IV BOLUS
INTRAVENOUS | Status: DC | PRN
  Administered 2020-08-03: 40 mg via INTRAVENOUS

## 2020-08-03 MED ORDER — OXYCODONE HCL 5 MG PO TABS
10.0000 mg | ORAL_TABLET | ORAL | Status: DC | PRN
  Administered 2020-08-03: 10 mg via ORAL
  Filled 2020-08-03: qty 2

## 2020-08-03 MED ORDER — CEFAZOLIN SODIUM-DEXTROSE 2-4 GM/100ML-% IV SOLN
2.0000 g | Freq: Four times a day (QID) | INTRAVENOUS | Status: AC
  Administered 2020-08-03 (×2): 2 g via INTRAVENOUS
  Filled 2020-08-03 (×2): qty 100

## 2020-08-03 MED ORDER — HYDROCHLOROTHIAZIDE 25 MG PO TABS
25.0000 mg | ORAL_TABLET | Freq: Every day | ORAL | Status: DC
  Administered 2020-08-04 – 2020-08-06 (×3): 25 mg via ORAL
  Filled 2020-08-03 (×3): qty 1

## 2020-08-03 MED ORDER — POVIDONE-IODINE 10 % EX SWAB
2.0000 "application " | Freq: Once | CUTANEOUS | Status: AC
  Administered 2020-08-03: 2 via TOPICAL

## 2020-08-03 MED ORDER — MONTELUKAST SODIUM 10 MG PO TABS
10.0000 mg | ORAL_TABLET | Freq: Every day | ORAL | Status: DC
  Administered 2020-08-03 – 2020-08-05 (×3): 10 mg via ORAL
  Filled 2020-08-03 (×3): qty 1

## 2020-08-03 MED ORDER — FENTANYL CITRATE (PF) 100 MCG/2ML IJ SOLN: 25.0000 ug | INTRAMUSCULAR | Status: DC | PRN

## 2020-08-03 MED ORDER — DOCUSATE SODIUM 100 MG PO CAPS
100.0000 mg | ORAL_CAPSULE | Freq: Two times a day (BID) | ORAL | Status: DC
  Administered 2020-08-03 – 2020-08-06 (×6): 100 mg via ORAL
  Filled 2020-08-03 (×6): qty 1

## 2020-08-03 MED ORDER — MEPIVACAINE HCL (PF) 2 % IJ SOLN
INTRAMUSCULAR | Status: DC | PRN
  Administered 2020-08-03: 3.5 mL via EPIDURAL

## 2020-08-03 MED ORDER — SODIUM CHLORIDE 0.9 % IV SOLN: INTRAVENOUS | Status: DC

## 2020-08-03 MED ORDER — METHOCARBAMOL 500 MG IVPB - SIMPLE MED
500.0000 mg | Freq: Four times a day (QID) | INTRAVENOUS | Status: DC | PRN
  Administered 2020-08-03: 500 mg via INTRAVENOUS
  Filled 2020-08-03: qty 50
  Filled 2020-08-03: qty 500

## 2020-08-03 MED ORDER — FLEET ENEMA 7-19 GM/118ML RE ENEM: 1.0000 | ENEMA | Freq: Once | RECTAL | Status: DC | PRN

## 2020-08-03 MED ORDER — OXYCODONE HCL 5 MG PO TABS: 5.0000 mg | ORAL_TABLET | ORAL | Status: DC | PRN

## 2020-08-03 MED ORDER — STERILE WATER FOR IRRIGATION IR SOLN
Status: DC | PRN
  Administered 2020-08-03: 2000 mL

## 2020-08-03 MED ORDER — ASPIRIN EC 325 MG PO TBEC
325.0000 mg | DELAYED_RELEASE_TABLET | Freq: Two times a day (BID) | ORAL | Status: DC
  Administered 2020-08-04 – 2020-08-06 (×5): 325 mg via ORAL
  Filled 2020-08-03 (×5): qty 1

## 2020-08-03 MED ORDER — ALPRAZOLAM 0.25 MG PO TABS
0.2500 mg | ORAL_TABLET | Freq: Two times a day (BID) | ORAL | Status: DC | PRN
  Administered 2020-08-03 – 2020-08-06 (×3): 0.25 mg via ORAL
  Filled 2020-08-03 (×3): qty 1

## 2020-08-03 MED ORDER — HYDROMORPHONE HCL 1 MG/ML IJ SOLN
INTRAMUSCULAR | Status: AC
  Filled 2020-08-03: qty 1

## 2020-08-03 MED ORDER — HYDROMORPHONE HCL 2 MG PO TABS
2.0000 mg | ORAL_TABLET | ORAL | Status: DC | PRN
  Administered 2020-08-03 – 2020-08-05 (×10): 4 mg via ORAL
  Administered 2020-08-06 (×2): 2 mg via ORAL
  Filled 2020-08-03: qty 2
  Filled 2020-08-03: qty 1
  Filled 2020-08-03: qty 2
  Filled 2020-08-03: qty 1
  Filled 2020-08-03 (×8): qty 2

## 2020-08-03 MED ORDER — ROPIVACAINE HCL 5 MG/ML IJ SOLN
INTRAMUSCULAR | Status: DC | PRN
  Administered 2020-08-03: 30 mL via PERINEURAL

## 2020-08-03 MED ORDER — ORAL CARE MOUTH RINSE: 15.0000 mL | Freq: Once | OROMUCOSAL | Status: AC

## 2020-08-03 MED ORDER — SODIUM CHLORIDE 0.9 % IR SOLN
Status: DC | PRN
  Administered 2020-08-03: 1000 mL

## 2020-08-03 MED ORDER — ONDANSETRON HCL 4 MG/2ML IJ SOLN
INTRAMUSCULAR | Status: DC | PRN
  Administered 2020-08-03: 4 mg via INTRAVENOUS

## 2020-08-03 MED ORDER — ONDANSETRON HCL 4 MG PO TABS: 4.0000 mg | ORAL_TABLET | Freq: Four times a day (QID) | ORAL | Status: DC | PRN

## 2020-08-03 MED ORDER — CHLORHEXIDINE GLUCONATE 0.12 % MT SOLN
15.0000 mL | Freq: Once | OROMUCOSAL | Status: AC
  Administered 2020-08-03: 15 mL via OROMUCOSAL

## 2020-08-03 MED ORDER — ACETAMINOPHEN 500 MG PO TABS
1000.0000 mg | ORAL_TABLET | Freq: Four times a day (QID) | ORAL | Status: AC
  Administered 2020-08-03 – 2020-08-04 (×4): 1000 mg via ORAL
  Filled 2020-08-03 (×4): qty 2

## 2020-08-03 MED ORDER — DEXAMETHASONE SODIUM PHOSPHATE 10 MG/ML IJ SOLN
INTRAMUSCULAR | Status: AC
  Filled 2020-08-03: qty 1

## 2020-08-03 MED ORDER — HYDROMORPHONE HCL 1 MG/ML IJ SOLN
0.5000 mg | INTRAMUSCULAR | Status: DC | PRN
  Administered 2020-08-03: 1 mg via INTRAVENOUS

## 2020-08-03 MED ORDER — LIDOCAINE 2% (20 MG/ML) 5 ML SYRINGE
INTRAMUSCULAR | Status: AC
  Filled 2020-08-03: qty 5

## 2020-08-03 MED ORDER — ACETAMINOPHEN 10 MG/ML IV SOLN
1000.0000 mg | Freq: Four times a day (QID) | INTRAVENOUS | Status: DC
  Administered 2020-08-03: 1000 mg via INTRAVENOUS
  Filled 2020-08-03: qty 100

## 2020-08-03 MED ORDER — 0.9 % SODIUM CHLORIDE (POUR BTL) OPTIME
TOPICAL | Status: DC | PRN
  Administered 2020-08-03: 1000 mL

## 2020-08-03 MED ORDER — BISACODYL 10 MG RE SUPP: 10.0000 mg | Freq: Every day | RECTAL | Status: DC | PRN

## 2020-08-03 MED ORDER — ONDANSETRON HCL 4 MG/2ML IJ SOLN: 4.0000 mg | Freq: Four times a day (QID) | INTRAMUSCULAR | Status: DC | PRN

## 2020-08-03 MED ORDER — OXYCODONE HCL 5 MG/5ML PO SOLN: 5.0000 mg | Freq: Once | ORAL | Status: AC | PRN

## 2020-08-03 MED ORDER — PROPOFOL 1000 MG/100ML IV EMUL
INTRAVENOUS | Status: AC
  Filled 2020-08-03: qty 100

## 2020-08-03 MED ORDER — GABAPENTIN 300 MG PO CAPS
300.0000 mg | ORAL_CAPSULE | Freq: Three times a day (TID) | ORAL | Status: DC
  Administered 2020-08-03 – 2020-08-06 (×10): 300 mg via ORAL
  Filled 2020-08-03 (×10): qty 1

## 2020-08-03 MED ORDER — SODIUM CHLORIDE (PF) 0.9 % IJ SOLN
INTRAMUSCULAR | Status: AC
  Filled 2020-08-03: qty 10

## 2020-08-03 MED ORDER — POLYETHYLENE GLYCOL 3350 17 G PO PACK
17.0000 g | PACK | Freq: Every day | ORAL | Status: DC | PRN
  Administered 2020-08-04: 17 g via ORAL
  Filled 2020-08-03: qty 1

## 2020-08-03 MED ORDER — PROPOFOL 500 MG/50ML IV EMUL
INTRAVENOUS | Status: AC
  Filled 2020-08-03: qty 50

## 2020-08-03 MED ORDER — METOCLOPRAMIDE HCL 5 MG PO TABS: 5.0000 mg | ORAL_TABLET | Freq: Three times a day (TID) | ORAL | Status: DC | PRN

## 2020-08-03 MED ORDER — BUPROPION HCL ER (XL) 300 MG PO TB24
300.0000 mg | ORAL_TABLET | Freq: Every day | ORAL | Status: DC
  Administered 2020-08-03 – 2020-08-05 (×3): 300 mg via ORAL
  Filled 2020-08-03 (×3): qty 1

## 2020-08-03 MED ORDER — TRANEXAMIC ACID-NACL 1000-0.7 MG/100ML-% IV SOLN
1000.0000 mg | INTRAVENOUS | Status: AC
  Administered 2020-08-03: 1000 mg via INTRAVENOUS
  Filled 2020-08-03: qty 100

## 2020-08-03 MED ORDER — OXYCODONE HCL 5 MG PO TABS
ORAL_TABLET | ORAL | Status: AC
  Filled 2020-08-03: qty 1

## 2020-08-03 MED ORDER — ONDANSETRON HCL 4 MG/2ML IJ SOLN
INTRAMUSCULAR | Status: AC
  Filled 2020-08-03: qty 2

## 2020-08-03 MED ORDER — FENTANYL CITRATE (PF) 100 MCG/2ML IJ SOLN
50.0000 ug | Freq: Once | INTRAMUSCULAR | Status: AC
  Administered 2020-08-03: 50 ug via INTRAVENOUS
  Filled 2020-08-03: qty 2

## 2020-08-03 MED ORDER — SODIUM CHLORIDE (PF) 0.9 % IJ SOLN
INTRAMUSCULAR | Status: DC | PRN
  Administered 2020-08-03: 60 mL

## 2020-08-03 MED ORDER — AMISULPRIDE (ANTIEMETIC) 5 MG/2ML IV SOLN: 10.0000 mg | Freq: Once | INTRAVENOUS | Status: DC | PRN

## 2020-08-03 MED ORDER — DIPHENHYDRAMINE HCL 12.5 MG/5ML PO ELIX: 12.5000 mg | ORAL_SOLUTION | ORAL | Status: DC | PRN

## 2020-08-03 MED ORDER — CEFAZOLIN SODIUM-DEXTROSE 2-4 GM/100ML-% IV SOLN
2.0000 g | INTRAVENOUS | Status: AC
  Administered 2020-08-03: 2 g via INTRAVENOUS
  Filled 2020-08-03: qty 100

## 2020-08-03 MED ORDER — PROPOFOL 500 MG/50ML IV EMUL
INTRAVENOUS | Status: DC | PRN
  Administered 2020-08-03: 75 ug/kg/min via INTRAVENOUS

## 2020-08-03 MED ORDER — CYCLOBENZAPRINE HCL 10 MG PO TABS
10.0000 mg | ORAL_TABLET | Freq: Every day | ORAL | Status: DC
  Administered 2020-08-03 – 2020-08-05 (×3): 10 mg via ORAL
  Filled 2020-08-03 (×3): qty 1

## 2020-08-03 MED ORDER — MIDAZOLAM HCL 2 MG/2ML IJ SOLN
1.0000 mg | Freq: Once | INTRAMUSCULAR | Status: AC
  Administered 2020-08-03: 2 mg via INTRAVENOUS
  Filled 2020-08-03: qty 2

## 2020-08-03 MED ORDER — DEXAMETHASONE SODIUM PHOSPHATE 10 MG/ML IJ SOLN
10.0000 mg | Freq: Once | INTRAMUSCULAR | Status: AC
  Administered 2020-08-04: 10 mg via INTRAVENOUS
  Filled 2020-08-03: qty 1

## 2020-08-03 MED ORDER — ONDANSETRON HCL 4 MG/2ML IJ SOLN: 4.0000 mg | Freq: Once | INTRAMUSCULAR | Status: DC | PRN

## 2020-08-03 MED ORDER — METHOCARBAMOL 500 MG PO TABS
500.0000 mg | ORAL_TABLET | Freq: Four times a day (QID) | ORAL | Status: DC | PRN
  Administered 2020-08-04 (×2): 500 mg via ORAL
  Filled 2020-08-03 (×2): qty 1

## 2020-08-03 MED ORDER — AZELASTINE HCL 0.1 % NA SOLN
1.0000 | Freq: Two times a day (BID) | NASAL | Status: DC | PRN
  Filled 2020-08-03: qty 30

## 2020-08-03 MED ORDER — ALBUTEROL SULFATE (2.5 MG/3ML) 0.083% IN NEBU: 2.5000 mg | INHALATION_SOLUTION | Freq: Four times a day (QID) | RESPIRATORY_TRACT | Status: DC | PRN

## 2020-08-03 MED ORDER — OXYCODONE HCL 5 MG PO TABS
5.0000 mg | ORAL_TABLET | Freq: Once | ORAL | Status: AC | PRN
  Administered 2020-08-03: 5 mg via ORAL

## 2020-08-03 MED ORDER — DEXAMETHASONE SODIUM PHOSPHATE 10 MG/ML IJ SOLN
INTRAMUSCULAR | Status: DC | PRN
  Administered 2020-08-03: 8 mg via INTRAVENOUS

## 2020-08-03 MED ORDER — METOCLOPRAMIDE HCL 5 MG/ML IJ SOLN: 5.0000 mg | Freq: Three times a day (TID) | INTRAMUSCULAR | Status: DC | PRN

## 2020-08-03 MED ORDER — MENTHOL 3 MG MT LOZG: 1.0000 | LOZENGE | OROMUCOSAL | Status: DC | PRN

## 2020-08-03 MED ORDER — DEXAMETHASONE SODIUM PHOSPHATE 10 MG/ML IJ SOLN: 8.0000 mg | Freq: Once | INTRAMUSCULAR | Status: AC

## 2020-08-03 MED ORDER — BUPIVACAINE LIPOSOME 1.3 % IJ SUSP
INTRAMUSCULAR | Status: DC | PRN
  Administered 2020-08-03: 20 mL

## 2020-08-03 MED ORDER — HYDROMORPHONE HCL 1 MG/ML IJ SOLN
0.5000 mg | INTRAMUSCULAR | Status: DC | PRN
  Administered 2020-08-03 – 2020-08-05 (×8): 1 mg via INTRAVENOUS
  Filled 2020-08-03 (×8): qty 1

## 2020-08-03 SURGICAL SUPPLY — 52 items
ATTUNE PS FEM RT SZ 5 CEM KNEE (Femur) ×2 IMPLANT
ATTUNE PSRP INSR SZ 5 10M KNEE (Insert) ×2 IMPLANT
BAG ZIPLOCK 12X15 (MISCELLANEOUS) ×2 IMPLANT
BASE TIBIAL ROT PLAT SZ 5 KNEE (Knees) ×1 IMPLANT
BLADE SAG 18X100X1.27 (BLADE) ×2 IMPLANT
BLADE SAW SGTL 11.0X1.19X90.0M (BLADE) ×2 IMPLANT
BNDG ELASTIC 6X5.8 VLCR STR LF (GAUZE/BANDAGES/DRESSINGS) ×2 IMPLANT
BOWL SMART MIX CTS (DISPOSABLE) ×2 IMPLANT
CEMENT HV SMART SET (Cement) ×4 IMPLANT
COVER SURGICAL LIGHT HANDLE (MISCELLANEOUS) ×2 IMPLANT
COVER WAND RF STERILE (DRAPES) IMPLANT
CUFF TOURN SGL QUICK 34 (TOURNIQUET CUFF) ×1
CUFF TRNQT CYL 34X4.125X (TOURNIQUET CUFF) ×1 IMPLANT
DECANTER SPIKE VIAL GLASS SM (MISCELLANEOUS) ×2 IMPLANT
DRAPE U-SHAPE 47X51 STRL (DRAPES) ×2 IMPLANT
DRSG AQUACEL AG ADV 3.5X10 (GAUZE/BANDAGES/DRESSINGS) ×2 IMPLANT
DURAPREP 26ML APPLICATOR (WOUND CARE) ×2 IMPLANT
ELECT REM PT RETURN 15FT ADLT (MISCELLANEOUS) ×2 IMPLANT
GLOVE SRG 8 PF TXTR STRL LF DI (GLOVE) ×1 IMPLANT
GLOVE SURG ENC MOIS LTX SZ6.5 (GLOVE) ×2 IMPLANT
GLOVE SURG ENC MOIS LTX SZ8 (GLOVE) ×4 IMPLANT
GLOVE SURG UNDER POLY LF SZ7 (GLOVE) ×2 IMPLANT
GLOVE SURG UNDER POLY LF SZ8 (GLOVE) ×1
GLOVE SURG UNDER POLY LF SZ8.5 (GLOVE) ×2 IMPLANT
GOWN STRL REUS W/TWL LRG LVL3 (GOWN DISPOSABLE) ×4 IMPLANT
GOWN STRL REUS W/TWL XL LVL3 (GOWN DISPOSABLE) ×2 IMPLANT
HANDPIECE INTERPULSE COAX TIP (DISPOSABLE) ×1
HOLDER FOLEY CATH W/STRAP (MISCELLANEOUS) ×2 IMPLANT
IMMOBILIZER KNEE 20 (SOFTGOODS) ×2
IMMOBILIZER KNEE 20 THIGH 36 (SOFTGOODS) ×1 IMPLANT
KIT TURNOVER KIT A (KITS) ×2 IMPLANT
MANIFOLD NEPTUNE II (INSTRUMENTS) ×2 IMPLANT
NS IRRIG 1000ML POUR BTL (IV SOLUTION) ×2 IMPLANT
PACK TOTAL KNEE CUSTOM (KITS) ×2 IMPLANT
PADDING CAST COTTON 6X4 STRL (CAST SUPPLIES) ×2 IMPLANT
PATELLA MEDIAL ATTUN 35MM KNEE (Knees) ×2 IMPLANT
PENCIL SMOKE EVACUATOR (MISCELLANEOUS) ×2 IMPLANT
PIN DRILL FIX HALF THREAD (BIT) ×2 IMPLANT
PIN STEINMAN FIXATION KNEE (PIN) ×2 IMPLANT
PROTECTOR NERVE ULNAR (MISCELLANEOUS) ×2 IMPLANT
SET HNDPC FAN SPRY TIP SCT (DISPOSABLE) ×1 IMPLANT
STRIP CLOSURE SKIN 1/2X4 (GAUZE/BANDAGES/DRESSINGS) ×4 IMPLANT
SUT MNCRL AB 4-0 PS2 18 (SUTURE) ×2 IMPLANT
SUT STRATAFIX 0 PDS 27 VIOLET (SUTURE) ×2
SUT VIC AB 2-0 CT1 27 (SUTURE) ×3
SUT VIC AB 2-0 CT1 TAPERPNT 27 (SUTURE) ×3 IMPLANT
SUTURE STRATFX 0 PDS 27 VIOLET (SUTURE) ×1 IMPLANT
TIBIAL BASE ROT PLAT SZ 5 KNEE (Knees) ×2 IMPLANT
TRAY FOLEY MTR SLVR 16FR STAT (SET/KITS/TRAYS/PACK) ×2 IMPLANT
TUBE SUCTION HIGH CAP CLEAR NV (SUCTIONS) ×2 IMPLANT
WATER STERILE IRR 1000ML POUR (IV SOLUTION) ×4 IMPLANT
WRAP KNEE MAXI GEL POST OP (GAUZE/BANDAGES/DRESSINGS) ×2 IMPLANT

## 2020-08-03 NOTE — Anesthesia Procedure Notes (Signed)
Anesthesia Regional Block: Adductor canal block   Pre-Anesthetic Checklist: , timeout performed,  Correct Patient, Correct Site, Correct Laterality,  Correct Procedure, Correct Position, site marked,  Risks and benefits discussed,  Surgical consent,  Pre-op evaluation,  At surgeon's request and post-op pain management  Laterality: Right  Prep: chloraprep       Needles:  Injection technique: Single-shot  Needle Type: Echogenic Stimulator Needle     Needle Length: 10cm  Needle Gauge: 20     Additional Needles:   Procedures:,,,, ultrasound used (permanent image in chart),,    Narrative:  Start time: 08/03/2020 8:09 AM End time: 08/03/2020 8:12 AM Injection made incrementally with aspirations every 5 mL.  Performed by: Personally  Anesthesiologist: Lucretia Kern, MD  Additional Notes: Standard monitors applied. Skin prepped. Good needle visualization with ultrasound. Injection made in 5cc increments with no resistance to injection. Patient tolerated the procedure well.

## 2020-08-03 NOTE — Discharge Instructions (Signed)
 Jeanette Aluisio, MD Total Joint Specialist [facility] [address] [place], [place] [address] [phone]  TOTAL KNEE REPLACEMENT POSTOPERATIVE DIRECTIONS    Knee Rehabilitation, Guidelines Following Surgery  Results after knee surgery are often greatly improved when you follow the exercise, range of motion and muscle strengthening exercises prescribed by your doctor. Safety measures are also important to protect the knee from further injury. If any of these exercises cause you to have increased pain or swelling in your knee joint, decrease the amount until you are comfortable again and slowly increase them. If you have problems or questions, call your caregiver or physical therapist for advice.   BLOOD CLOT PREVENTION Take a 325 mg Aspirin two times a day for three weeks following surgery. Then take an 81 mg Aspirin once a day for three weeks. Then discontinue Aspirin. You may resume your vitamins/supplements upon discharge from the hospital. Do not take any NSAIDs (Advil, Aleve, Ibuprofen, Meloxicam, etc.) until you have discontinued the 325 mg Aspirin.  HOME CARE INSTRUCTIONS  Remove items at home which could result in a fall. This includes throw rugs or furniture in walking pathways.  ICE to the affected knee as much as tolerated. Icing helps control swelling. If the swelling is well controlled you will be more comfortable and rehab easier. Continue to use ice on the knee for pain and swelling from surgery. You may notice swelling that will progress down to the foot and ankle. This is normal after surgery. Elevate the leg when you are not up walking on it.    Continue to use the breathing machine which will help keep your temperature down. It is common for your temperature to cycle up and down following surgery, especially at night when you are not up moving around and exerting yourself. The breathing machine keeps your lungs expanded and your temperature  down. Do not place pillow under the operative knee, focus on keeping the knee straight while resting  DIET You may resume your previous home diet once you are discharged from the hospital.  DRESSING / WOUND CARE / SHOWERING Keep your bulky bandage on for 2 days. On the third post-operative day you may remove the Ace bandage and gauze. There is a waterproof adhesive bandage on your skin which will stay in place until your first follow-up appointment. Once you remove this you will not need to place another bandage You may begin showering 3 days following surgery, but do not submerge the incision under water.  ACTIVITY For the first 5 days, the key is rest and control of pain and swelling Do your home exercises twice a day starting on post-operative day 3. On the days you go to physical therapy, just do the home exercises once that day. You should rest, ice and elevate the leg for 50 minutes out of every hour. Get up and walk/stretch for 10 minutes per hour. After 5 days you can increase your activity slowly as tolerated. Walk with your walker as instructed. Use the walker until you are comfortable transitioning to a cane. Walk with the cane in the opposite hand of the operative leg. You may discontinue the cane once you are comfortable and walking steadily. Avoid periods of inactivity such as sitting longer than an hour when not asleep. This helps prevent blood clots.  You may discontinue the knee immobilizer once you are able to perform a straight leg raise while lying down. You may resume a sexual relationship in one month   or when given the OK by your doctor.  You may return to work once you are cleared by your doctor.  Do not drive a car for 6 weeks or until released by your surgeon.  Do not drive while taking narcotics.  TED HOSE STOCKINGS Wear the elastic stockings on both legs for three weeks following surgery during the day. You may remove them at night for sleeping.  WEIGHT  BEARING Weight bearing as tolerated with assist device (walker, cane, etc) as directed, use it as long as suggested by your surgeon or therapist, typically at least 4-6 weeks.  POSTOPERATIVE CONSTIPATION PROTOCOL Constipation - defined medically as fewer than three stools per week and severe constipation as less than one stool per week.  One of the most common issues patients have following surgery is constipation.  Even if you have a regular bowel pattern at home, your normal regimen is likely to be disrupted due to multiple reasons following surgery.  Combination of anesthesia, postoperative narcotics, change in appetite and fluid intake all can affect your bowels.  In order to avoid complications following surgery, here are some recommendations in order to help you during your recovery period.  Colace (docusate) - Pick up an over-the-counter form of Colace or another stool softener and take twice a day as long as you are requiring postoperative pain medications.  Take with a full glass of water daily.  If you experience loose stools or diarrhea, hold the colace until you stool forms back up. If your symptoms do not get better within 1 week or if they get worse, check with your doctor. Dulcolax (bisacodyl) - Pick up over-the-counter and take as directed by the product packaging as needed to assist with the movement of your bowels.  Take with a full glass of water.  Use this product as needed if not relieved by Colace only.  MiraLax (polyethylene glycol) - Pick up over-the-counter to have on hand. MiraLax is a solution that will increase the amount of water in your bowels to assist with bowel movements.  Take as directed and can mix with a glass of water, juice, soda, coffee, or tea. Take if you go more than two days without a movement. Do not use MiraLax more than once per day. Call your doctor if you are still constipated or irregular after using this medication for 7 days in a row.  If you continue  to have problems with postoperative constipation, please contact the office for further assistance and recommendations.  If you experience "the worst abdominal pain ever" or develop nausea or vomiting, please contact the office immediatly for further recommendations for treatment.  ITCHING If you experience itching with your medications, try taking only a single pain pill, or even half a pain pill at a time.  You can also use Benadryl over the counter for itching or also to help with sleep.   MEDICATIONS See your medication summary on the "After Visit Summary" that the nursing staff will review with you prior to discharge.  You may have some home medications which will be placed on hold until you complete the course of blood thinner medication.  It is important for you to complete the blood thinner medication as prescribed by your surgeon.  Continue your approved medications as instructed at time of discharge.  PRECAUTIONS If you experience chest pain or shortness of breath - call 911 immediately for transfer to the hospital emergency department.  If you develop a fever greater that 101 F,   purulent drainage from wound, increased redness or drainage from wound, foul odor from the wound/dressing, or calf pain - CONTACT YOUR SURGEON.                                                   FOLLOW-UP APPOINTMENTS Make sure you keep all of your appointments after your operation with your surgeon and caregivers. You should call the office at the above phone number and make an appointment for approximately two weeks after the date of your surgery or on the date instructed by your surgeon outlined in the "After Visit Summary".  RANGE OF MOTION AND STRENGTHENING EXERCISES  Rehabilitation of the knee is important following a knee injury or an operation. After just a few days of immobilization, the muscles of the thigh which control the knee become weakened and shrink (atrophy). Knee exercises are designed to build up  the tone and strength of the thigh muscles and to improve knee motion. Often times heat used for twenty to thirty minutes before working out will loosen up your tissues and help with improving the range of motion but do not use heat for the first two weeks following surgery. These exercises can be done on a training (exercise) mat, on the floor, on a table or on a bed. Use what ever works the best and is most comfortable for you Knee exercises include:  Leg Lifts - While your knee is still immobilized in a splint or cast, you can do straight leg raises. Lift the leg to 60 degrees, hold for 3 sec, and slowly lower the leg. Repeat 10-20 times 2-3 times daily. Perform this exercise against resistance later as your knee gets better.  Quad and Hamstring Sets - Tighten up the muscle on the front of the thigh (Quad) and hold for 5-10 sec. Repeat this 10-20 times hourly. Hamstring sets are done by pushing the foot backward against an object and holding for 5-10 sec. Repeat as with quad sets.  Leg Slides: Lying on your back, slowly slide your foot toward your buttocks, bending your knee up off the floor (only go as far as is comfortable). Then slowly slide your foot back down until your leg is flat on the floor again. Angel Wings: Lying on your back spread your legs to the side as far apart as you can without causing discomfort.  A rehabilitation program following serious knee injuries can speed recovery and prevent re-injury in the future due to weakened muscles. Contact your doctor or a physical therapist for more information on knee rehabilitation.   POST-OPERATIVE OPIOID TAPER INSTRUCTIONS: It is important to wean off of your opioid medication as soon as possible. If you do not need pain medication after your surgery it is ok to stop day one. Opioids include: Codeine, Hydrocodone(Norco, Vicodin), Oxycodone(Percocet, oxycontin) and hydromorphone amongst others.  Long term and even short term use of opiods can  cause: Increased pain response Dependence Constipation Depression Respiratory depression And more.  Withdrawal symptoms can include Flu like symptoms Nausea, vomiting And more Techniques to manage these symptoms Hydrate well Eat regular healthy meals Stay active Use relaxation techniques(deep breathing, meditating, yoga) Do Not substitute Alcohol to help with tapering If you have been on opioids for less than two weeks and do not have pain than it is ok to stop all together.  Plan   to wean off of opioids This plan should start within one week post op of your joint replacement. Maintain the same interval or time between taking each dose and first decrease the dose.  Cut the total daily intake of opioids by one tablet each day Next start to increase the time between doses. The last dose that should be eliminated is the evening dose.   IF YOU ARE TRANSFERRED TO A SKILLED REHAB FACILITY If the patient is transferred to a skilled rehab facility following release from the hospital, a list of the current medications will be sent to the facility for the patient to continue.  When discharged from the skilled rehab facility, please have the facility set up the patient's Home Health Physical Therapy prior to being released. Also, the skilled facility will be responsible for providing the patient with their medications at time of release from the facility to include their pain medication, the muscle relaxants, and their blood thinner medication. If the patient is still at the rehab facility at time of the two week follow up appointment, the skilled rehab facility will also need to assist the patient in arranging follow up appointment in our office and any transportation needs.  MAKE SURE YOU:  Understand these instructions.  Get help right away if you are not doing well or get worse.   DENTAL ANTIBIOTICS:  In most cases prophylactic antibiotics for Dental procdeures after total joint surgery are  not necessary.  Exceptions are as follows:  1. History of prior total joint infection  2. Severely immunocompromised (Organ Transplant, cancer chemotherapy, Rheumatoid biologic meds such as Humera)  3. Poorly controlled diabetes (A1C &gt; 8.0, blood glucose over 200)  If you have one of these conditions, contact your surgeon for an antibiotic prescription, prior to your dental procedure.    Pick up stool softner and laxative for home use following surgery while on pain medications. Do not submerge incision under water. Please use good hand washing techniques while changing dressing each day. May shower starting three days after surgery. Please use a clean towel to pat the incision dry following showers. Continue to use ice for pain and swelling after surgery. Do not use any lotions or creams on the incision until instructed by your surgeon.  

## 2020-08-03 NOTE — Interval H&P Note (Signed)
History and Physical Interval Note:  08/03/2020 6:28 AM  Jeanette Rodriguez  has presented today for surgery, with the diagnosis of right knee osteoarthritis.  The various methods of treatment have been discussed with the patient and family. After consideration of risks, benefits and other options for treatment, the patient has consented to  Procedure(s) with comments: TOTAL KNEE ARTHROPLASTY (Right) - as a surgical intervention.  The patient's history has been reviewed, patient examined, no change in status, stable for surgery.  I have reviewed the patient's chart and labs.  Questions were answered to the patient's satisfaction.     Homero Fellers Shahir Karen

## 2020-08-03 NOTE — Anesthesia Procedure Notes (Signed)
Spinal  Patient location during procedure: OR Start time: 08/03/2020 8:21 AM End time: 08/03/2020 8:26 AM Reason for block: surgical anesthesia Staffing Performed: resident/CRNA  Anesthesiologist: Lucretia Kern, MD Resident/CRNA: Pearson Grippe, CRNA Preanesthetic Checklist Completed: patient identified, IV checked, site marked, risks and benefits discussed, surgical consent, monitors and equipment checked, pre-op evaluation and timeout performed Spinal Block Patient position: sitting Prep: DuraPrep Patient monitoring: heart rate, cardiac monitor, continuous pulse ox and blood pressure Approach: midline Location: L3-4 Injection technique: single-shot Needle Needle type: Sprotte  Needle gauge: 24 G Needle length: 9 cm Assessment Sensory level: T6 Events: CSF return Additional Notes Pt sitting for spinal. Spinal attempt x1, + clear, free-flowing CSF, easy to aspirate/inject. - paresthesia. Level to T6, NAC.

## 2020-08-03 NOTE — Anesthesia Postprocedure Evaluation (Signed)
Anesthesia Post Note  Patient: Roswell Miners  Procedure(s) Performed: TOTAL KNEE ARTHROPLASTY (Right: Knee)     Patient location during evaluation: PACU Anesthesia Type: Spinal Level of consciousness: oriented and awake and alert Pain management: pain level controlled Vital Signs Assessment: post-procedure vital signs reviewed and stable Respiratory status: spontaneous breathing, respiratory function stable and nonlabored ventilation Cardiovascular status: blood pressure returned to baseline and stable Postop Assessment: no headache, no backache, no apparent nausea or vomiting and spinal receding Anesthetic complications: no   No notable events documented.  Last Vitals:  Vitals:   08/03/20 1200 08/03/20 1217  BP: (!) 145/107 (!) 157/104  Pulse: (!) 59 81  Resp: (!) 22   Temp: 36.5 C 36.4 C  SpO2: 99% 100%    Last Pain:  Vitals:   08/03/20 1217  TempSrc: Oral  PainSc:                  Lucretia Kern

## 2020-08-03 NOTE — Progress Notes (Signed)
AssistedDr. Carolyn Witman with right, ultrasound guided, adductor canal block. Side rails up, monitors on throughout procedure. See vital signs in flow sheet. Tolerated Procedure well.  

## 2020-08-03 NOTE — Transfer of Care (Signed)
Immediate Anesthesia Transfer of Care Note  Patient: Jeanette Rodriguez  Procedure(s) Performed: TOTAL KNEE ARTHROPLASTY (Right: Knee)  Patient Location: PACU  Anesthesia Type:Spinal and MAC combined with regional for post-op pain  Level of Consciousness: awake, alert  and oriented  Airway & Oxygen Therapy: Patient Spontanous Breathing and Patient connected to face mask oxygen  Post-op Assessment: Report given to RN and Post -op Vital signs reviewed and stable  Post vital signs: Reviewed and stable  Last Vitals:  Vitals Value Taken Time  BP 129/82 08/03/20 0958  Temp    Pulse 72 08/03/20 0959  Resp 15 08/03/20 0959  SpO2 100 % 08/03/20 0959  Vitals shown include unvalidated device data.  Last Pain:  Vitals:   08/03/20 0814  TempSrc:   PainSc: 0-No pain      Patients Stated Pain Goal: 4 (45/36/46 8032)  Complications: No notable events documented.

## 2020-08-03 NOTE — Op Note (Signed)
OPERATIVE REPORT-TOTAL KNEE ARTHROPLASTY   Pre-operative diagnosis- Osteoarthritis  Right knee(s)  Post-operative diagnosis- Osteoarthritis Right knee(s)  Procedure-  Right  Total Knee Arthroplasty  Surgeon- Gus Rankin. Dorene Bruni, MD  Assistant- Leilani Able, PA-C   Anesthesia-   Adductor canal block and spinal  EBL-150 mL   Drains None  Tourniquet time-  Total Tourniquet Time Documented: Thigh (Right) - 36 minutes Total: Thigh (Right) - 36 minutes     Complications- None  Condition-PACU - hemodynamically stable.   Brief Clinical Note  Jeanette Rodriguez is a 65 y.o. year old female with end stage OA of her right knee with progressively worsening pain and dysfunction. She has constant pain, with activity and at rest and significant functional deficits with difficulties even with ADLs. She has had extensive non-op management including analgesics, injections of cortisone and viscosupplements, and home exercise program, but remains in significant pain with significant dysfunction.Radiographs show bone on bone arthritis medial and patellofemoral. She presents now for right Total Knee Arthroplasty.     Procedure in detail---   The patient is brought into the operating room and positioned supine on the operating table. After successful administration of  Adductor canal block and spinal,   a tourniquet is placed high on the  Right thigh(s) and the lower extremity is prepped and draped in the usual sterile fashion. Time out is performed by the operating team and then the  Right lower extremity is wrapped in Esmarch, knee flexed and the tourniquet inflated to 300 mmHg.       A midline incision is made with a ten blade through the subcutaneous tissue to the level of the extensor mechanism. A fresh blade is used to make a medial parapatellar arthrotomy. Soft tissue over the proximal medial tibia is subperiosteally elevated to the joint line with a knife and into the semimembranosus bursa with a Cobb  elevator. Soft tissue over the proximal lateral tibia is elevated with attention being paid to avoiding the patellar tendon on the tibial tubercle. The patella is everted, knee flexed 90 degrees and the ACL and PCL are removed. Findings are bone on bone medial and patellofemoral with large global osteophytes.        The drill is used to create a starting hole in the distal femur and the canal is thoroughly irrigated with sterile saline to remove the fatty contents. The 5 degree Right  valgus alignment guide is placed into the femoral canal and the distal femoral cutting block is pinned to remove 9 mm off the distal femur. Resection is made with an oscillating saw.      The tibia is subluxed forward and the menisci are removed. The extramedullary alignment guide is placed referencing proximally at the medial aspect of the tibial tubercle and distally along the second metatarsal axis and tibial crest. The block is pinned to remove 71mm off the more deficient medial  side. Resection is made with an oscillating saw. Size 5is the most appropriate size for the tibia and the proximal tibia is prepared with the modular drill and keel punch for that size.      The femoral sizing guide is placed and size 5 is most appropriate. Rotation is marked off the epicondylar axis and confirmed by creating a rectangular flexion gap at 90 degrees. The size 5 cutting block is pinned in this rotation and the anterior, posterior and chamfer cuts are made with the oscillating saw. The intercondylar block is then placed and that cut is made.  Trial size 5 tibial component, trial size 5 posterior stabilized femur and a 10  mm posterior stabilized rotating platform insert trial is placed. Full extension is achieved with excellent varus/valgus and anterior/posterior balance throughout full range of motion. The patella is everted and thickness measured to be 22  mm. Free hand resection is taken to 12 mm, a 35 template is placed, lug holes  are drilled, trial patella is placed, and it tracks normally. Osteophytes are removed off the posterior femur with the trial in place. All trials are removed and the cut bone surfaces prepared with pulsatile lavage. Cement is mixed and once ready for implantation, the size 5 tibial implant, size  5 posterior stabilized femoral component, and the size 35 patella are cemented in place and the patella is held with the clamp. The trial insert is placed and the knee held in full extension. The Exparel (20 ml mixed with 60 ml saline) is injected into the extensor mechanism, posterior capsule, medial and lateral gutters and subcutaneous tissues.  All extruded cement is removed and once the cement is hard the permanent 10 mm posterior stabilized rotating platform insert is placed into the tibial tray.      The wound is copiously irrigated with saline solution and the extensor mechanism closed with # 0 Stratofix suture. The tourniquet is released for a total tourniquet time of 36  minutes. Flexion against gravity is 140 degrees and the patella tracks normally. Subcutaneous tissue is closed with 2.0 vicryl and subcuticular with running 4.0 Monocryl. The incision is cleaned and dried and steri-strips and a bulky sterile dressing are applied. The limb is placed into a knee immobilizer and the patient is awakened and transported to recovery in stable condition.      Please note that a surgical assistant was a medical necessity for this procedure in order to perform it in a safe and expeditious manner. Surgical assistant was necessary to retract the ligaments and vital neurovascular structures to prevent injury to them and also necessary for proper positioning of the limb to allow for anatomic placement of the prosthesis.   Gus Rankin Jeanette Rumbold, MD    08/03/2020, 9:28 AM

## 2020-08-03 NOTE — Progress Notes (Signed)
PT Cancellation Note  Patient Details Name: Jeanette Rodriguez MRN: [id number] DOB: 22-Mar-1955   Cancelled Treatment:    Reason Eval/Treat Not Completed: Pain limiting ability to participate PT deferred by RN. Pt reporting 10/10 pain levels.   Lyman Speller PT, DPT  Acute Rehabilitation Services  Office [phone]

## 2020-08-03 NOTE — Progress Notes (Signed)
Orthopedic Tech Progress Note Patient Details:  Ravonda Brecheen 02/28/1955 [id number]  Orders for unna boots/knee immobilizer were acknowledged and brought to PACU, but not applied as patient already had ted hose and knee immobilizer on. Nurse asked for the ted hose to remain on instead of applying unna boots yet and said we will be called if they are needed at a later time.  CPM Right Knee CPM Right Knee: On Right Knee Flexion (Degrees): 10 Right Knee Extension (Degrees): 40  Post Interventions Patient Tolerated: Well Instructions Provided: Adjustment of device, Care of device  Aicha Clingenpeel [name] 08/03/2020, 11:43 AM

## 2020-08-03 NOTE — Progress Notes (Signed)
Orthopedic Tech Progress Note Patient Details:  Jeanette Rodriguez [birth year] [id number]   CPM Right Knee CPM Right Knee: Off (Off around 1330/1345) Right Knee Flexion (Degrees): 10 Right Knee Extension (Degrees): 40 Additional Comments: Removed at 2 hours by RN to help manage some discomfort.  Post Interventions Patient Tolerated: Fair Instructions Provided: Adjustment of device, Care of device  [name] [name] 08/03/2020, 3:28 PM

## 2020-08-04 DIAGNOSIS — M1710 Unilateral primary osteoarthritis, unspecified knee: Secondary | ICD-10-CM | POA: Diagnosis present

## 2020-08-04 DIAGNOSIS — K219 Gastro-esophageal reflux disease without esophagitis: Secondary | ICD-10-CM | POA: Diagnosis present

## 2020-08-04 DIAGNOSIS — M25561 Pain in right knee: Secondary | ICD-10-CM | POA: Diagnosis not present

## 2020-08-04 DIAGNOSIS — Z79899 Other long term (current) drug therapy: Secondary | ICD-10-CM | POA: Diagnosis not present

## 2020-08-04 DIAGNOSIS — I10 Essential (primary) hypertension: Secondary | ICD-10-CM | POA: Diagnosis present

## 2020-08-04 DIAGNOSIS — Z881 Allergy status to other antibiotic agents status: Secondary | ICD-10-CM | POA: Diagnosis not present

## 2020-08-04 DIAGNOSIS — Z87891 Personal history of nicotine dependence: Secondary | ICD-10-CM | POA: Diagnosis not present

## 2020-08-04 DIAGNOSIS — Z8614 Personal history of Methicillin resistant Staphylococcus aureus infection: Secondary | ICD-10-CM | POA: Diagnosis not present

## 2020-08-04 DIAGNOSIS — F419 Anxiety disorder, unspecified: Secondary | ICD-10-CM | POA: Diagnosis present

## 2020-08-04 DIAGNOSIS — M25761 Osteophyte, right knee: Secondary | ICD-10-CM | POA: Diagnosis present

## 2020-08-04 DIAGNOSIS — M1711 Unilateral primary osteoarthritis, right knee: Secondary | ICD-10-CM | POA: Diagnosis present

## 2020-08-04 LAB — CBC
HCT: 38.1 % (ref 36.0–46.0)
Hemoglobin: 12.2 g/dL (ref 12.0–15.0)
MCH: 29.7 pg (ref 26.0–34.0)
MCHC: 32 g/dL (ref 30.0–36.0)
MCV: 92.7 fL (ref 80.0–100.0)
Platelets: 372 10*3/uL (ref 150–400)
RBC: 4.11 MIL/uL (ref 3.87–5.11)
RDW: 13.4 % (ref 11.5–15.5)
WBC: 11.6 10*3/uL — ABNORMAL HIGH (ref 4.0–10.5)
nRBC: 0 % (ref 0.0–0.2)

## 2020-08-04 LAB — BASIC METABOLIC PANEL
Anion gap: 7 (ref 5–15)
BUN: 15 mg/dL (ref 8–23)
CO2: 31 mmol/L (ref 22–32)
Calcium: 8.8 mg/dL — ABNORMAL LOW (ref 8.9–10.3)
Chloride: 103 mmol/L (ref 98–111)
Creatinine, Ser: 0.96 mg/dL (ref 0.44–1.00)
GFR, Estimated: >60 mL/min (ref >60)
Glucose, Bld: 134 mg/dL — ABNORMAL HIGH (ref 70–99)
Potassium: 3.6 mmol/L (ref 3.5–5.1)
Sodium: 141 mmol/L (ref 135–145)

## 2020-08-04 MED ORDER — ASPIRIN 325 MG PO TBEC: 325.0000 mg | DELAYED_RELEASE_TABLET | Freq: Two times a day (BID) | ORAL | 0 refills | Status: AC

## 2020-08-04 MED ORDER — METHOCARBAMOL 500 MG PO TABS
500.0000 mg | ORAL_TABLET | Freq: Four times a day (QID) | ORAL | 0 refills | Status: DC | PRN
Start: 2020-08-04 — End: 2021-07-13

## 2020-08-04 MED ORDER — HYDROMORPHONE HCL 2 MG PO TABS: 2.0000 mg | ORAL_TABLET | Freq: Four times a day (QID) | ORAL | 0 refills | Status: DC | PRN

## 2020-08-04 MED ORDER — GABAPENTIN 300 MG PO CAPS: ORAL_CAPSULE | ORAL | 0 refills | Status: DC

## 2020-08-04 NOTE — Progress Notes (Signed)
Pt has had slower progression with physical therapy, is not yet meeting goals to be cleared for discharge. Will stay overnight and continue working with therapy tomorrow.   Christyne Mccain, PA-C Orthopedic Surgery [facility] [place]   

## 2020-08-04 NOTE — TOC Initial Note (Signed)
Transition of Care [facility]) - Initial/Assessment Note   Patient Details  Name: Jeanette Rodriguez MRN: [id number] Date of Birth: 18-Feb-1956  [facility]) CM/SW Contact:    Sherie Don, LCSW Phone Number: 08/04/2020, 9:47 AM  Clinical Narrative: Patient is a 65 year old female who is in an outpatient bed for osteoarthritis of knee. CSW met with patient to review discharge plan. Patient to discharge home with OPPT through [facility]. Patient has a rollator at home, but this was not billed through insurance. Patient will need a regular rolling walker. [employer] to deliver walker to patient's room. TOC to follow for possible additional discharge needs.  Expected Discharge Plan: OP Rehab Barriers to Discharge: No Barriers Identified  Patient Goals and CMS Choice Patient states their goals for this hospitalization and ongoing recovery are:: Discharge home with OPPT through [place] CMS Medicare.gov Compare Post Acute Care list provided to:: Patient Choice offered to / list presented to : Patient  Expected Discharge Plan and Services Expected Discharge Plan: OP Rehab In-house Referral: Clinical Social Work Post Acute Care Choice: Durable Medical Equipment Living arrangements for the past 2 months: Single Family Home Expected Discharge Date: 08/04/20               DME Arranged: Gilford Rile rolling DME Agency: [employer] Representative spoke with at DME Agency: Pre-arranged in orthopedist's office  Prior Living Arrangements/Services Living arrangements for the past 2 months: [place] Lives with:: Spouse Patient language and need for interpreter reviewed:: Yes Need for Family Participation in Patient Care: No (Comment) Care giver support system in place?: Yes (comment) Current home services: DME (Rollator) Criminal Activity/Legal Involvement Pertinent to Current Situation/Hospitalization: No - Comment as needed  Activities of Daily Living Home Assistive  Devices/Equipment: Environmental consultant (specify type), Cane (specify quad or straight), Eyeglasses, Nebulizer ADL Screening (condition at time of admission) Patient's cognitive ability adequate to safely complete daily activities?: Yes Is the patient deaf or have difficulty hearing?: No Does the patient have difficulty seeing, even when wearing glasses/contacts?: No Does the patient have difficulty concentrating, remembering, or making decisions?: No Patient able to express need for assistance with ADLs?: Yes Does the patient have difficulty dressing or bathing?: No Independently performs ADLs?: Yes (appropriate for developmental age) Does the patient have difficulty walking or climbing stairs?: Yes Weakness of Legs: Both Weakness of Arms/Hands: Right  Emotional Assessment Appearance:: Appears stated age Attitude/Demeanor/Rapport: Engaged Affect (typically observed): Accepting Orientation: : Oriented to Self, Oriented to Place, Oriented to  Time, Oriented to Situation Alcohol / Substance Use: Not Applicable Psych Involvement: No (comment)  Admission diagnosis:  OA (osteoarthritis) of knee [M17.10] Patient Active Problem List   Diagnosis Date Noted   OA (osteoarthritis) of knee 08/03/2020   PCP:  Maurice Small, MD Pharmacy:   [id number] [id number]: [phone]: [phone] Readmission Risk Interventions No flowsheet data found.

## 2020-08-04 NOTE — Progress Notes (Signed)
Physical Therapy Treatment Patient Details Name: Jeanette Rodriguez MRN: [id number] DOB: 09-02-1955 Today's Date: 08/04/2020    History of Present Illness Pt is a 65 year old female admitted 08/03/20 for Rt TKA.  Past medical history significant for multiple ankle surgeries, PNA, anxiety    PT Comments    Pt requesting to use bathroom on arrival so provided [facility] and assisted with transfers.  Pt reports pain however more tolerable this afternoon, and pt able to ambulate 30 feet. Pt does not appear ready to d/c home yet (she has 3 steps to enter home).  Significant other present for session and also feels pt not ready to d/c home yet.  Pt remains concerned about mobility and pain management.  Ice packs to right knee end of session.    Follow Up Recommendations  Follow surgeon's recommendation for DC plan and follow-up therapies     Equipment Recommendations  Rolling walker with 5" wheels    Recommendations for Other Services       Precautions / Restrictions Precautions Precautions: Fall;Knee Precaution Comments: able to perform SLR Restrictions Weight Bearing Restrictions: No RLE Weight Bearing: Weight bearing as tolerated    Mobility  Bed Mobility Overal bed mobility: Needs Assistance Bed Mobility: Sit to Supine     Supine to sit: Min assist     General bed mobility comments: verbal cues for self assist, required assist for rt LE due to pain    Transfers Overall transfer level: Needs assistance Equipment used: Rolling walker (2 wheeled) Transfers: Sit to/from [employer] Sit to Stand: Min assist Stand pivot transfers: Min assist       General transfer comment: verbal cues for UE and LE positioning, assist for support while transitioning hand placement, required keeping Rt knee in extended position for pain control, pt reports pain however appears more tolerable this afternoon  Ambulation/Gait Ambulation/Gait assistance: Min guard Gait Distance (Feet):  30 Feet Assistive device: Rolling walker (2 wheeled) Gait Pattern/deviations: Step-to pattern;Trunk flexed;Antalgic;Decreased stance time - right     General Gait Details: verbal cues for sequence, RW Positioning, posture, step length; recliner following however not needed; distance to tolerance   Stairs             Wheelchair Mobility    Modified Rankin (Stroke Patients Only)       Balance                                            Cognition Arousal/Alertness: Awake/alert Behavior During Therapy: WFL for tasks assessed/performed Overall Cognitive Status: Within Functional Limits for tasks assessed                                        Exercises      General Comments        Pertinent Vitals/Pain Pain Assessment: 0-10 Pain Score: 7  Pain Location: rt knee Pain Descriptors / Indicators: Aching;Sore Pain Intervention(s): Repositioned;Premedicated before session;Monitored during session;Ice applied    Home Living Family/patient expects to be discharged to:: Private residence Living Arrangements: Spouse/significant other   Type of Home: House Home Access: Stairs to enter Entrance Stairs-Rails: Right Home Layout: One level Home Equipment: Walker - 4 wheels      Prior Function Level of Independence: Independent with assistive device(s)  Comments: using rollator due to pain in multiple joints   PT Goals (current goals can now be found in the care plan section) Acute Rehab PT Goals PT Goal Formulation: With patient Time For Goal Achievement: 08/11/20 Potential to Achieve Goals: Good Progress towards PT goals: Progressing toward goals    Frequency    7X/week      PT Plan Current plan remains appropriate    Co-evaluation              AM-PAC PT "6 Clicks" Mobility   Outcome Measure  Help needed turning from your back to your side while in a flat bed without using bedrails?: A Little Help needed  moving from lying on your back to sitting on the side of a flat bed without using bedrails?: A Little Help needed moving to and from a bed to a chair (including a wheelchair)?: A Lot Help needed standing up from a chair using your arms (e.g., wheelchair or bedside chair)?: A Lot Help needed to walk in hospital room?: A Lot Help needed climbing 3-5 steps with a railing? : Total 6 Click Score: 13    End of Session Equipment Utilized During Treatment: Gait belt Activity Tolerance: Patient limited by pain Patient left: with call bell/phone within reach;in bed;with bed alarm set;with family/visitor present Nurse Communication: Mobility status PT Visit Diagnosis: Difficulty in walking, not elsewhere classified (R26.2);Pain Pain - Right/Left: Right Pain - part of body: Knee     Time: 2297-9892 PT Time Calculation (min) (ACUTE ONLY): 32 min  Charges:  $Gait Training: 8-22 mins $Therapeutic Activity: 8-22 mins                     Thomasene Mohair PT, DPT Acute Rehabilitation Services Pager: [phone] Office: [phone]   Maida Sale E 08/04/2020, 4:14 PM

## 2020-08-04 NOTE — Progress Notes (Signed)
   Subjective: 1 Day Post-Op Procedure(s) (LRB): TOTAL KNEE ARTHROPLASTY (Right) Patient reports pain as moderate.   Patient seen in rounds by Dr. Lequita Halt. Patient had issues with increased pain yesterday that was not responding to PO oxycodone. Switched to dilaudid PO with relief. Pain in better control this AM upon rounds, though patient is very emotional. Denies chest pain, SOB, or calf pain.  We will begin therapy today.   Objective: Vital signs in last 24 hours: Temp:  [97.6 F (36.4 C)-98.4 F (36.9 C)] 97.8 F (36.6 C) (06/14 0518) Pulse Rate:  [50-81] 73 (06/14 0518) Resp:  [15-22] 16 (06/14 0518) BP: (128-171)/(65-107) 136/74 (06/14 0518) SpO2:  [96 %-100 %] 98 % (06/14 0518)  Intake/Output from previous day:  Intake/Output Summary (Last 24 hours) at 08/04/2020 0754 Last data filed at 08/04/2020 5427 Gross per 24 hour  Intake 3166.61 ml  Output 2475 ml  Net 691.61 ml     Intake/Output this shift: No intake/output data recorded.  Labs: Recent Labs    08/04/20 0316  HGB 12.2   Recent Labs    08/04/20 0316  WBC 11.6*  RBC 4.11  HCT 38.1  PLT 372   Recent Labs    08/04/20 0316  NA 141  K 3.6  CL 103  CO2 31  BUN 15  CREATININE 0.96  GLUCOSE 134*  CALCIUM 8.8*   No results for input(s): LABPT, INR in the last 72 hours.  Exam: General - Patient is Alert and Oriented Extremity - Neurologically intact Neurovascular intact Sensation intact distally Dorsiflexion/Plantar flexion intact Dressing - dressing C/D/I Motor Function - intact, moving foot and toes well on exam.   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   Weight as of this encounter: 96.9 kg. Advance diet Up with therapy D/C IV fluids   Patient's anticipated LOS is less than 2 midnights, meeting these requirements: - Younger than 44 - Lives within 1 hour of care - Has a competent adult at home to recover with post-op recover - NO history of  - Chronic pain requiring opiods  - Diabetes  - Coronary Artery Disease  - Heart failure  - Heart attack  - Stroke  - DVT/VTE  - Cardiac arrhythmia  - Respiratory Failure/COPD  - Renal failure  - Anemia  - Advanced Liver disease  DVT Prophylaxis - Aspirin Weight bearing as tolerated. Continue therapy.  Plan is to go Home after hospital stay. Possible discharge later today if meeting goals with therapy and emotionally ready to go home. Otherwise will keep until tomorrow. Outpatient PT is set up at [facility]) Follow-up in the office in 2 weeks  The PDMP database was reviewed today prior to any opioid medications being prescribed to this patient.  [facility], PA-C Orthopedic Surgery [phone] 08/04/2020, 7:54 AM

## 2020-08-04 NOTE — Evaluation (Signed)
Physical Therapy Evaluation Patient Details Name: Jeanette Rodriguez MRN: [id number] DOB: [birth year] Today's Date: 08/04/2020   History of Present Illness  Pt is a 65 year old female admitted 08/03/20 for Rt TKA.  Past medical history significant for multiple ankle surgeries, PNA, anxiety  Clinical Impression  Pt is s/p TKA resulting in the deficits listed below (see PT Problem List). Pt will benefit from skilled PT to increase their independence and safety with mobility to allow discharge to the venue listed below.  Pt reports significant pain with any movement so required support and min assist with mobilizing.  Pt also required rest breaks due to pain. Pt assisted OOB to recliner and felt unable to ambulate at this time due to 9/10 right knee pain (RN notified and aware, pt was premedicated for session as well).      Follow Up Recommendations Follow surgeon's recommendation for DC plan and follow-up therapies    Equipment Recommendations  Rolling walker with 5" wheels    Recommendations for Other Services       Precautions / Restrictions Precautions Precautions: Fall;Knee Precaution Comments: able to perform SLR Restrictions Weight Bearing Restrictions: No RLE Weight Bearing: Weight bearing as tolerated      Mobility  Bed Mobility Overal bed mobility: Needs Assistance Bed Mobility: Supine to Sit     Supine to sit: Min assist     General bed mobility comments: verbal cues for self assist, required assist for rt LE due to pain    Transfers Overall transfer level: Needs assistance Equipment used: Rolling walker (2 wheeled) Transfers: Sit to/from [employer] Sit to Stand: Min assist;From elevated surface Stand pivot transfers: Min assist       General transfer comment: verbal cues for UE and LE positioning, assist for support while transitioning hand placement, required keeping Rt knee in extended position for pain control, pt reports 9/10 pain with attempt  to stand/pivot so did not ambulate at this time  Ambulation/Gait                Stairs            Wheelchair Mobility    Modified Rankin (Stroke Patients Only)       Balance                                             Pertinent Vitals/Pain Pain Assessment: 0-10 Pain Score: 9  Pain Location: rt knee Pain Descriptors / Indicators: Aching;Sore Pain Intervention(s): Monitored during session;Repositioned;Ice applied;Premedicated before session    Home Living Family/patient expects to be discharged to:: Private residence Living Arrangements: Spouse/significant other   Type of Home: House Home Access: Stairs to enter Entrance Stairs-Rails: Right Entrance Stairs-Number of Steps: 3 Home Layout: One level Home Equipment: Environmental consultant - 4 wheels      Prior Function Level of Independence: Independent with assistive device(s)         Comments: using rollator due to pain in multiple joints     Hand Dominance        Extremity/Trunk Assessment        Lower Extremity Assessment Lower Extremity Assessment: RLE deficits/detail RLE Deficits / Details: able to perform SLR, not able to tolerate knee flexion at this time RLE: Unable to fully assess due to pain       Communication   Communication: No difficulties  Cognition Arousal/Alertness: Awake/alert  Behavior During Therapy: [facility] for tasks assessed/performed;Anxious Overall Cognitive Status: Within Functional Limits for tasks assessed                                        General Comments      Exercises     Assessment/Plan    PT Assessment Patient needs continued PT services  PT Problem List Decreased strength;Decreased range of motion;Decreased mobility;Decreased balance;Pain;Decreased knowledge of precautions;Decreased knowledge of use of DME;Decreased activity tolerance       PT Treatment Interventions Stair training;Gait training;DME instruction;Therapeutic  exercise;Balance training;Functional mobility training;Therapeutic activities;Patient/family education    PT Goals (Current goals can be found in the Care Plan section)  Acute Rehab PT Goals PT Goal Formulation: With patient Time For Goal Achievement: 08/11/20 Potential to Achieve Goals: Good    Frequency 7X/week   Barriers to discharge        Co-evaluation               AM-PAC PT "6 Clicks" Mobility  Outcome Measure Help needed turning from your back to your side while in a flat bed without using bedrails?: A Lot Help needed moving from lying on your back to sitting on the side of a flat bed without using bedrails?: A Lot Help needed moving to and from a bed to a chair (including a wheelchair)?: A Lot Help needed standing up from a chair using your arms (e.g., wheelchair or bedside chair)?: A Lot Help needed to walk in hospital room?: A Lot Help needed climbing 3-5 steps with a railing? : Total 6 Click Score: 11    End of Session Equipment Utilized During Treatment: Gait belt Activity Tolerance: Patient limited by pain Patient left: in chair;with call bell/phone within reach;with chair alarm set Nurse Communication: Mobility status;Patient requests pain meds PT Visit Diagnosis: Difficulty in walking, not elsewhere classified (R26.2);Pain Pain - Right/Left: Right Pain - part of body: Knee    Time: 1011-1035 PT Time Calculation (min) (ACUTE ONLY): 24 min   Charges:   PT Evaluation $PT Eval Low Complexity: 1 Low PT Treatments $Therapeutic Activity: 8-22 mins       Thomasene Mohair PT, DPT Acute Rehabilitation Services Pager: [phone] Office: [phone]  Maida Sale E 08/04/2020, 1:18 PM

## 2020-08-04 NOTE — Plan of Care (Signed)
  Problem: Coping: Goal: Level of anxiety will decrease Outcome: Progressing   Problem: Pain Managment: Goal: General experience of comfort will improve Outcome: Progressing   

## 2020-08-05 ENCOUNTER — Encounter (HOSPITAL_COMMUNITY): Payer: Self-pay | Admitting: Orthopedic Surgery

## 2020-08-05 LAB — BASIC METABOLIC PANEL
Anion gap: 9 (ref 5–15)
BUN: 15 mg/dL (ref 8–23)
CO2: 30 mmol/L (ref 22–32)
Calcium: 9.1 mg/dL (ref 8.9–10.3)
Chloride: 100 mmol/L (ref 98–111)
Creatinine, Ser: 0.87 mg/dL (ref 0.44–1.00)
GFR, Estimated: >60 mL/min (ref >60)
Glucose, Bld: 130 mg/dL — ABNORMAL HIGH (ref 70–99)
Potassium: 3.8 mmol/L (ref 3.5–5.1)
Sodium: 139 mmol/L (ref 135–145)

## 2020-08-05 LAB — CBC
HCT: 37.6 % (ref 36.0–46.0)
Hemoglobin: 12.1 g/dL (ref 12.0–15.0)
MCH: 29.6 pg (ref 26.0–34.0)
MCHC: 32.2 g/dL (ref 30.0–36.0)
MCV: 91.9 fL (ref 80.0–100.0)
Platelets: 385 10*3/uL (ref 150–400)
RBC: 4.09 MIL/uL (ref 3.87–5.11)
RDW: 13.5 % (ref 11.5–15.5)
WBC: 12.2 10*3/uL — ABNORMAL HIGH (ref 4.0–10.5)
nRBC: 0 % (ref 0.0–0.2)

## 2020-08-05 NOTE — Progress Notes (Signed)
   Subjective: 2 Days Post-Op Procedure(s) (LRB): TOTAL KNEE ARTHROPLASTY (Right) Patient reports pain as moderate.   Patient seen in rounds for Dr. Lequita Halt. Patient is tearful this AM, reports she had a really rough night in terms of pain. Required IV dilaudid but was still able to get around 5 hours of sleep. She is slightly cautious about going home, as her spouse is disabled. Advised that the block most likely wore off last night, and her pain should slightly improve from here. Cannot increase narcotics from where she is already.  Voiding without difficulty, positive flatus. Plan is to go Home after hospital stay.  Objective: Vital signs in last 24 hours: Temp:  [97.8 F (36.6 C)-98 F (36.7 C)] 98 F (36.7 C) (06/15 0525) Pulse Rate:  [75-87] 78 (06/15 0525) Resp:  [16-20] 16 (06/15 0525) BP: (143-178)/(70-83) 158/73 (06/15 0525) SpO2:  [93 %-98 %] 95 % (06/15 0525)  Intake/Output from previous day:  Intake/Output Summary (Last 24 hours) at 08/05/2020 0811 Last data filed at 08/04/2020 2100 Gross per 24 hour  Intake --  Output 2400 ml  Net -2400 ml    Intake/Output this shift: No intake/output data recorded.  Labs: Recent Labs    08/04/20 0316 08/05/20 0322  HGB 12.2 12.1   Recent Labs    08/04/20 0316 08/05/20 0322  WBC 11.6* 12.2*  RBC 4.11 4.09  HCT 38.1 37.6  PLT 372 385   Recent Labs    08/04/20 0316 08/05/20 0322  NA 141 139  K 3.6 3.8  CL 103 100  CO2 31 30  BUN 15 15  CREATININE 0.96 0.87  GLUCOSE 134* 130*  CALCIUM 8.8* 9.1   No results for input(s): LABPT, INR in the last 72 hours.  Exam: General - Patient is Alert and Oriented Extremity - Neurologically intact Neurovascular intact Sensation intact distally Dorsiflexion/Plantar flexion intact Dressing/Incision - clean, dry, no drainage. Aquacel in place. Motor Function - intact, moving foot and toes well on exam.   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   Weight as of this encounter: 96.9 kg. Up with therapy  DVT Prophylaxis - Aspirin Weight-bearing as tolerated  Possible discharge later today if pain under better control and meeting goals with therapy. Otherwise will stay until tomorrow to maximize mobility before returning home.  , PA-C Orthopedic Surgery [phone] 08/05/2020, 8:11 AM

## 2020-08-05 NOTE — Progress Notes (Signed)
Physical Therapy Treatment Patient Details Name: Jeanette Rodriguez MRN: [id number] DOB: 03/09/1955 Today's Date: 08/05/2020    History of Present Illness Pt is a 65 year old female admitted 08/03/20 for Rt TKA.  Past medical history significant for multiple ankle surgeries, PNA, anxiety    PT Comments    Pt assisted to bathroom and then ambulated short distance in hallway.  Pt reports pain improved today and utilized KI and left knee brace for pain control and improved stability.    Follow Up Recommendations  Follow surgeon's recommendation for DC plan and follow-up therapies     Equipment Recommendations  Rolling walker with 5" wheels    Recommendations for Other Services       Precautions / Restrictions Precautions Precautions: Fall;Knee Precaution Comments: unable to perform SLR today Required Braces or Orthoses: Knee Immobilizer - Right;Other Brace Other Brace: has supportive brace for left knee Restrictions RLE Weight Bearing: Weight bearing as tolerated    Mobility  Bed Mobility Overal bed mobility: Needs Assistance Bed Mobility: Supine to Sit     Supine to sit: Min guard     General bed mobility comments: verbal cues for self assist, utlized gait belt to support Rt LE    Transfers Overall transfer level: Needs assistance Equipment used: Rolling walker (2 wheeled) Transfers: Sit to/from Stand Sit to Stand: Min assist         General transfer comment: verbal cues for UE and LE positioning, assist for support while transitioning hand placement  Ambulation/Gait Ambulation/Gait assistance: Min guard Gait Distance (Feet): 35 Feet Assistive device: Rolling walker (2 wheeled) Gait Pattern/deviations: Step-to pattern;Trunk flexed;Antalgic;Decreased stance time - right Gait velocity: decr   General Gait Details: verbal cues for sequence, RW Positioning, posture, step length; ambulated to/from bathroom and short distance in hallway   Stairs              Wheelchair Mobility    Modified Rankin (Stroke Patients Only)       Balance                                            Cognition Arousal/Alertness: Awake/alert Behavior During Therapy: WFL for tasks assessed/performed Overall Cognitive Status: Within Functional Limits for tasks assessed                                        Exercises      General Comments        Pertinent Vitals/Pain Pain Assessment: 0-10 Pain Score: 6  Pain Location: rt knee Pain Descriptors / Indicators: Aching;Sore Pain Intervention(s): Monitored during session;Premedicated before session;Repositioned    Home Living                      Prior Function            PT Goals (current goals can now be found in the care plan section) Progress towards PT goals: Progressing toward goals    Frequency    7X/week      PT Plan Current plan remains appropriate    Co-evaluation              AM-PAC PT "6 Clicks" Mobility   Outcome Measure  Help needed turning from your back to your side while in a flat  bed without using bedrails?: A Little Help needed moving from lying on your back to sitting on the side of a flat bed without using bedrails?: A Little Help needed moving to and from a bed to a chair (including a wheelchair)?: A Lot Help needed standing up from a chair using your arms (e.g., wheelchair or bedside chair)?: A Lot Help needed to walk in hospital room?: A Lot Help needed climbing 3-5 steps with a railing? : Total 6 Click Score: 13    End of Session Equipment Utilized During Treatment: Gait belt Activity Tolerance: Patient tolerated treatment well Patient left: with call bell/phone within reach;with family/visitor present;in chair Nurse Communication: Mobility status PT Visit Diagnosis: Difficulty in walking, not elsewhere classified (R26.2);Pain Pain - Right/Left: Right Pain - part of body: Knee     Time: 0920-0949 PT  Time Calculation (min) (ACUTE ONLY): 29 min  Charges:  $Gait Training: 23-37 mins                    Jeanette Rodriguez PT, DPT Acute Rehabilitation Services Pager: [phone] Office: [phone]  Maida Sale E 08/05/2020, 4:18 PM

## 2020-08-05 NOTE — Progress Notes (Signed)
Physical Therapy Treatment Patient Details Name: Jeanette Rodriguez MRN: [id number] DOB: 04-Jul-1955 Today's Date: 08/05/2020    History of Present Illness Pt is a 65 year old female admitted 08/03/20 for Rt TKA.  Past medical history significant for multiple ankle surgeries, PNA, anxiety    PT Comments    Pt assisted with ambulating in hallway and tolerated improved distance this afternoon.  Pt also practiced one step (wanting to use stool at home to get into high bed).  Pt still needs to practice multiple steps to enter home and feels she will need another session for this.  Pt anticipates d/c home tomorrow if able to perform steps.    Follow Up Recommendations  Follow surgeon's recommendation for DC plan and follow-up therapies     Equipment Recommendations  Rolling walker with 5" wheels    Recommendations for Other Services       Precautions / Restrictions Precautions Precautions: Fall;Knee Precaution Comments: unable to perform SLR today Required Braces or Orthoses: Knee Immobilizer - Right;Other Brace Other Brace: has supportive brace for left knee Restrictions RLE Weight Bearing: Weight bearing as tolerated    Mobility  Bed Mobility Overal bed mobility: Needs Assistance Bed Mobility: Supine to Sit;Sit to Supine     Supine to sit: Min guard Sit to supine: Min guard   General bed mobility comments: verbal cues for self assist, utilized gait belt to support Rt LE    Transfers Overall transfer level: Needs assistance Equipment used: Rolling walker (2 wheeled) Transfers: Sit to/from Stand Sit to Stand: Min guard         General transfer comment: verbal cues for UE and LE positioning, increased time and effort  Ambulation/Gait Ambulation/Gait assistance: Min guard Gait Distance (Feet): 50 Feet Assistive device: Rolling walker (2 wheeled) Gait Pattern/deviations: Step-to pattern;Trunk flexed;Antalgic;Decreased stance time - right Gait velocity: decr   General  Gait Details: verbal cues for sequence, RW Positioning, posture, step length   Stairs Stairs: Yes Stairs assistance: Min guard Stair Management: Step to pattern;Backwards Number of Stairs: 1 General stair comments: pt wanting to practice stepping onto stool to get into high bed at home so performed one step backwards with RW, cues for sequence and safety   Wheelchair Mobility    Modified Rankin (Stroke Patients Only)       Balance                                            Cognition Arousal/Alertness: Awake/alert Behavior During Therapy: WFL for tasks assessed/performed Overall Cognitive Status: Within Functional Limits for tasks assessed                                        Exercises Total Joint Exercises Ankle Circles/Pumps: AROM;Both;10 reps Quad Sets: AROM;Right;10 reps Heel Slides: AAROM;Right;10 reps Goniometric ROM: AAROM knee flexion limited to approx 30* due to pain    General Comments        Pertinent Vitals/Pain Pain Assessment: 0-10 Pain Score: 5  Pain Location: rt knee Pain Descriptors / Indicators: Aching;Sore Pain Intervention(s): Repositioned;Monitored during session;Premedicated before session    Home Living                      Prior Function  PT Goals (current goals can now be found in the care plan section) Progress towards PT goals: Progressing toward goals    Frequency    7X/week      PT Plan Current plan remains appropriate    Co-evaluation              AM-PAC PT "6 Clicks" Mobility   Outcome Measure  Help needed turning from your back to your side while in a flat bed without using bedrails?: A Little Help needed moving from lying on your back to sitting on the side of a flat bed without using bedrails?: A Little Help needed moving to and from a bed to a chair (including a wheelchair)?: A Little Help needed standing up from a chair using your arms (e.g.,  wheelchair or bedside chair)?: A Little Help needed to walk in hospital room?: A Little Help needed climbing 3-5 steps with a railing? : A Lot 6 Click Score: 17    End of Session Equipment Utilized During Treatment: Gait belt Activity Tolerance: Patient tolerated treatment well Patient left: in bed;with call bell/phone within reach Nurse Communication: Mobility status PT Visit Diagnosis: Difficulty in walking, not elsewhere classified (R26.2);Pain Pain - Right/Left: Right Pain - part of body: Knee     Time: 4540-9811 PT Time Calculation (min) (ACUTE ONLY): 24 min  Charges:  $Gait Training: 23-37 mins                    Kati PT, DPT Acute Rehabilitation Services Pager: [phone] Office: [phone]   Sarajane Jews 08/05/2020, 4:32 PM

## 2020-08-05 NOTE — Plan of Care (Signed)
  Problem: Coping: Goal: Level of anxiety will decrease Outcome: Progressing   Problem: Elimination: Goal: Will not experience complications related to urinary retention Outcome: Progressing   Problem: Pain Managment: Goal: General experience of comfort will improve Outcome: Progressing   

## 2020-08-06 LAB — CBC
HCT: 37.1 % (ref 36.0–46.0)
Hemoglobin: 11.7 g/dL — ABNORMAL LOW (ref 12.0–15.0)
MCH: 29.5 pg (ref 26.0–34.0)
MCHC: 31.5 g/dL (ref 30.0–36.0)
MCV: 93.5 fL (ref 80.0–100.0)
Platelets: 372 10*3/uL (ref 150–400)
RBC: 3.97 MIL/uL (ref 3.87–5.11)
RDW: 13.6 % (ref 11.5–15.5)
WBC: 10.8 10*3/uL — ABNORMAL HIGH (ref 4.0–10.5)
nRBC: 0 % (ref 0.0–0.2)

## 2020-08-06 NOTE — Plan of Care (Signed)
  Problem: Activity: Goal: Ability to avoid complications of mobility impairment will improve 08/06/2020 0727 by Beverly Sessions, RN Outcome: Progressing 08/06/2020 0727 by Beverly Sessions, RN Outcome: Progressing   Problem: Safety: Goal: Ability to remain free from injury will improve 08/06/2020 0727 by Beverly Sessions, RN Outcome: Progressing 08/06/2020 0727 by Beverly Sessions, RN Outcome: Progressing   Problem: Pain Managment: Goal: General experience of comfort will improve 08/06/2020 0727 by Beverly Sessions, RN Outcome: Progressing 08/06/2020 0727 by Beverly Sessions, RN Outcome: Progressing

## 2020-08-06 NOTE — Progress Notes (Signed)
   Subjective: 3 Days Post-Op Procedure(s) (LRB): TOTAL KNEE ARTHROPLASTY (Right) Patient reports pain as moderate.   Patient seen in rounds for Dr. Lequita Halt. Patient is still struggling to move her leg, and she feels like she hasn't progressed much to this point. Discussed going home with her and she feels better about the idea today. Ambulated around 50 feet with PT yesterday. Will continue PT today.  Plan is to go Home after hospital stay.  Objective: Vital signs in last 24 hours: Temp:  [98.1 F (36.7 C)-98.4 F (36.9 C)] 98.1 F (36.7 C) (06/16 0445) Pulse Rate:  [72-82] 82 (06/16 0445) Resp:  [18-20] 19 (06/16 0445) BP: (147-187)/(63-77) 172/77 (06/16 0445) SpO2:  [91 %-95 %] 91 % (06/16 0445)  Intake/Output from previous day:  Intake/Output Summary (Last 24 hours) at 08/06/2020 0800 Last data filed at 08/05/2020 2030 Gross per 24 hour  Intake 480 ml  Output --  Net 480 ml    Intake/Output this shift: No intake/output data recorded.  Labs: Recent Labs    08/04/20 0316 08/05/20 0322 08/06/20 0321  HGB 12.2 12.1 11.7*   Recent Labs    08/05/20 0322 08/06/20 0321  WBC 12.2* 10.8*  RBC 4.09 3.97  HCT 37.6 37.1  PLT 385 372   Recent Labs    08/04/20 0316 08/05/20 0322  NA 141 139  K 3.6 3.8  CL 103 100  CO2 31 30  BUN 15 15  CREATININE 0.96 0.87  GLUCOSE 134* 130*  CALCIUM 8.8* 9.1   No results for input(s): LABPT, INR in the last 72 hours.  Exam: General - Patient is Alert and Oriented Extremity - Neurologically intact Neurovascular intact Intact pulses distally Dorsiflexion/Plantar flexion intact Dressing/Incision - clean, dry, no drainage Motor Function - intact, moving foot and toes well on exam.   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   Weight as of this encounter: 96.9 kg. Up with therapy  DVT Prophylaxis - Aspirin and TED hose Weight-bearing as tolerated  Will plan for discharge this afternoon, following two PT sessions, if meeting goals with PT.   Patient to follow up in two weeks with Dr. in clinic.   The PDMP database was reviewed today prior to any opioid medications being prescribed to this patient.  Lequita Halt, MBA, PA-C Orthopedic Surgery 08/06/2020, 8:00 AM

## 2020-08-06 NOTE — Plan of Care (Signed)
  Problem: Health Behavior/Discharge Planning: Goal: Ability to manage health-related needs will improve Outcome: Progressing   Problem: Clinical Measurements: Goal: Ability to maintain clinical measurements within normal limits will improve Outcome: Progressing Goal: Will remain free from infection Outcome: Progressing Goal: Diagnostic test results will improve Outcome: Progressing Goal: Respiratory complications will improve Outcome: Progressing Goal: Cardiovascular complication will be avoided Outcome: Progressing   Problem: Activity: Goal: Risk for activity intolerance will decrease Outcome: Progressing   Problem: Coping: Goal: Level of anxiety will decrease Outcome: Progressing   Problem: Elimination: Goal: Will not experience complications related to bowel motility Outcome: Progressing Goal: Will not experience complications related to urinary retention Outcome: Progressing   Problem: Pain Managment: Goal: General experience of comfort will improve Outcome: Progressing   Problem: Safety: Goal: Ability to remain free from injury will improve Outcome: Progressing   Problem: Skin Integrity: Goal: Risk for impaired skin integrity will decrease Outcome: Progressing   Problem: Education: Goal: Knowledge of the prescribed therapeutic regimen will improve Outcome: Progressing   Problem: Activity: Goal: Ability to avoid complications of mobility impairment will improve Outcome: Progressing Goal: Range of joint motion will improve Outcome: Progressing   Problem: Clinical Measurements: Goal: Postoperative complications will be avoided or minimized Outcome: Progressing   Problem: Pain Management: Goal: Pain level will decrease with appropriate interventions Outcome: Progressing   Problem: Skin Integrity: Goal: Will show signs of wound healing Outcome: Progressing   

## 2020-08-06 NOTE — Plan of Care (Signed)
Pt dc'd

## 2020-08-06 NOTE — Progress Notes (Signed)
Physical Therapy Treatment Patient Details Name: Jeanette Rodriguez MRN: [id number] DOB: 18-Mar-1955 Today's Date: 08/06/2020    History of Present Illness Pt is a 65 year old female admitted 08/03/20 for Rt TKA.  Past medical history significant for multiple ankle surgeries, PNA, anxiety    PT Comments    Pt eager to d/c home after one session today.  Pt ambulated in hallway and practiced safe stair technique with significant other observing.  Pt also performed LE exercises and provided with HEP.  Pt reports understanding and feels ready for d/c home today.    Follow Up Recommendations  Follow surgeon's recommendation for DC plan and follow-up therapies     Equipment Recommendations  Rolling walker with 5" wheels    Recommendations for Other Services       Precautions / Restrictions Precautions Precautions: Fall;Knee Precaution Comments: pt declined wearing knee braces today Restrictions RLE Weight Bearing: Weight bearing as tolerated    Mobility  Bed Mobility Overal bed mobility: Needs Assistance Bed Mobility: Supine to Sit     Supine to sit: Min guard     General bed mobility comments: verbal cues for self assist, utilized gait belt to support Rt LE    Transfers Overall transfer level: Needs assistance Equipment used: Rolling walker (2 wheeled) Transfers: Sit to/from Stand Sit to Stand: Min guard         General transfer comment: verbal cues for UE and LE positioning, increased time and effort  Ambulation/Gait Ambulation/Gait assistance: Min guard Gait Distance (Feet): 80 Feet Assistive device: Rolling walker (2 wheeled) Gait Pattern/deviations: Step-to pattern;Trunk flexed;Antalgic;Decreased stance time - right Gait velocity: decr   General Gait Details: verbal cues for sequence, RW Positioning, posture, step length   Stairs Stairs: Yes Stairs assistance: Min guard Stair Management: Step to pattern;Forwards;With walker Number of Stairs: 2 General stair  comments: performed once with 2 rails forwards and then again with RW backwards (since pt only has one rail at home), cues for sequence and safety, significant other present and observed   Wheelchair Mobility    Modified Rankin (Stroke Patients Only)       Balance                                            Cognition Arousal/Alertness: Awake/alert Behavior During Therapy: WFL for tasks assessed/performed Overall Cognitive Status: Within Functional Limits for tasks assessed                                        Exercises Total Joint Exercises Ankle Circles/Pumps: AROM;Both;10 reps Quad Sets: AROM;Right;10 reps Heel Slides: AAROM;Right;10 reps Straight Leg Raises: AAROM;Right;10 reps Knee Flexion: AAROM;Right;10 reps;Seated    General Comments        Pertinent Vitals/Pain Pain Assessment: 0-10 Pain Score: 5  Pain Location: rt knee Pain Descriptors / Indicators: Aching;Sore Pain Intervention(s): Repositioned;Monitored during session;Premedicated before session;Ice applied    Home Living Family/patient expects to be discharged to:: Private residence Living Arrangements: Spouse/significant other                  Prior Function            PT Goals (current goals can now be found in the care plan section) Progress towards PT goals: Progressing toward goals  Frequency    7X/week      PT Plan Current plan remains appropriate    Co-evaluation              AM-PAC PT "6 Clicks" Mobility   Outcome Measure  Help needed turning from your back to your side while in a flat bed without using bedrails?: A Little Help needed moving from lying on your back to sitting on the side of a flat bed without using bedrails?: A Little Help needed moving to and from a bed to a chair (including a wheelchair)?: A Little Help needed standing up from a chair using your arms (e.g., wheelchair or bedside chair)?: A Little Help needed  to walk in hospital room?: A Little Help needed climbing 3-5 steps with a railing? : A Little 6 Click Score: 18    End of Session Equipment Utilized During Treatment: Gait belt Activity Tolerance: Patient tolerated treatment well Patient left: with call bell/phone within reach;in chair;with family/visitor present Nurse Communication: Mobility status PT Visit Diagnosis: Difficulty in walking, not elsewhere classified (R26.2)     Time: 3646-8032 PT Time Calculation (min) (ACUTE ONLY): 29 min  Charges:  $Gait Training: 8-22 mins $Therapeutic Exercise: 8-22 mins                     Thomasene Mohair PT, DPT Acute Rehabilitation Services Pager: [phone] Office: [phone]  Maida Sale E 08/06/2020, 12:47 PM

## 2020-08-07 ENCOUNTER — Ambulatory Visit (HOSPITAL_BASED_OUTPATIENT_CLINIC_OR_DEPARTMENT_OTHER): Payer: Medicare Other | Admitting: Physical Therapy

## 2020-08-11 ENCOUNTER — Other Ambulatory Visit: Payer: Self-pay

## 2020-08-11 ENCOUNTER — Encounter (HOSPITAL_BASED_OUTPATIENT_CLINIC_OR_DEPARTMENT_OTHER): Payer: Self-pay | Admitting: Physical Therapy

## 2020-08-11 ENCOUNTER — Ambulatory Visit (HOSPITAL_BASED_OUTPATIENT_CLINIC_OR_DEPARTMENT_OTHER): Payer: Medicare Other | Admitting: Physical Therapy

## 2020-08-11 DIAGNOSIS — M25661 Stiffness of right knee, not elsewhere classified: Secondary | ICD-10-CM

## 2020-08-11 DIAGNOSIS — R600 Localized edema: Secondary | ICD-10-CM

## 2020-08-11 DIAGNOSIS — R2681 Unsteadiness on feet: Secondary | ICD-10-CM

## 2020-08-11 DIAGNOSIS — M25561 Pain in right knee: Secondary | ICD-10-CM

## 2020-08-12 NOTE — Therapy (Signed)
[facility] [address] [place], [place], [address] Phone: [phone]   Fax:  [phone]  Physical Therapy Evaluation  Patient Details  Name: Jeanette MinersLoraine Rodriguez MRN: [id number] Date of Birth: [birth year] Referring Provider (PT): Nelia ShiSean Childress, [place]   Encounter Date: 08/11/2020   PT End of Session - 08/11/20 1211     Visit Number 1    Date for PT Re-Evaluation 10/09/20    Authorization Type Medicare A and B    Authorization Time Period 08/11/20 to 10/09/20    Progress Note Due on Visit 10    PT Start Time 1019    PT Stop Time 1100    PT Time Calculation (min) 41 min    Activity Tolerance Patient limited by pain    Behavior [facility] Therapy [facility] for tasks assessed/performed             Past Medical History:  Diagnosis Date   Anxiety    Arthritis    Asthma    GERD (gastroesophageal reflux disease)    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 [place]   Hypertension    Pneumonia     Past Surgical History:  Procedure Laterality Date   ANKLE SURGERY     x3 right   2007, 2009,2011   TONSILLECTOMY     TOTAL KNEE ARTHROPLASTY Right 08/03/2020   Procedure: TOTAL KNEE ARTHROPLASTY;  Surgeon: Ollen GrossAluisio, Frank, MD;  Location: [facility];  Service: Orthopedics;  Laterality: Right;  50min    There were no vitals filed for this visit.    Subjective Assessment - 08/11/20 1024     Subjective Pt had Rt TKA on 08/03/20. She states it is rough being home. She is able to dress herself and shower, etc. but she is not sure if this is where she should be. She is icing her knee as often as she can with a game ready device and is taking her pain medication as instructed. She has had Rt ankle surgery x3 and now has "bone on bone" in the ankle, and her Lt knee is due for a knee replacement as well. Because of this, she uses a walker and does not necessarily expect to get rid of this by the end of therapy.    Pertinent History Rt ankle  surgery x3, Lt knee arthritis-will likely schedule her Lt knee replacement when she is able.    Limitations Walking    How long can you sit comfortably? uncomfortable regardless of LE position    Patient Stated Goals get an idea of what she should be doing.    Currently in Pain? Yes   on pain meds   Pain Score 7     Pain Location Knee    Pain Orientation Right    Pain Descriptors / Indicators Aching;Sore    Pain Type Acute pain    Pain Radiating Towards none    Pain Onset In the past 7 days    Pain Frequency Constant    Aggravating Factors  sitting unable to find comfortable position    Pain Relieving Factors ice/pain meds                [facility] PT Assessment - 08/12/20 0001       Assessment   Medical Diagnosis pain in Rt knee    Referring Provider (PT) Nelia ShiSean Childress, PA-C    Onset Date/Surgical Date 08/03/20    Next MD Visit next week    Prior  Therapy while in the hospital post op      Precautions   Required Braces or Orthoses Knee Immobilizer - Right      Balance Screen   Has the patient fallen in the past 6 months No    Has the patient had a decrease in activity level because of a fear of falling?  No    Is the patient reluctant to leave their home because of a fear of falling?  No      Prior Function   Level of Independence Independent      Cognition   Overall Cognitive Status Within Functional Limits for tasks assessed      Observation/Other Assessments   Observations pt with moderate swelling in the Rt knee; surgical bandage still intact    Focus on Therapeutic Outcomes (FOTO)  next visit      Posture/Postural Control   Posture Comments Pt keeping the Rt knee flexed throughout the session due to reports of discomfort      PROM   Right Knee Extension -23   lacking   Right Knee Flexion 60      Bed Mobility   Bed Mobility --   pt requiring increased time for bed mobility     Transfers   Five time sit to stand comments  22 sec elevated seat and heavy use  of UE on walker    Comments weight shifted Lt      Ambulation/Gait   Assistive device Rolling walker    Gait Comments pt ambulated with antalgic pattern                        Objective measurements completed on examination: See above findings.       [facility] Adult PT Treatment/Exercise - 08/12/20 0001       Knee/Hip Exercises: Standing   Other Standing Knee Exercises weight shifting in RW x30 sec, HEP demo      Knee/Hip Exercises: Seated   Other Seated Knee/Hip Exercises Rt knee flexion stretch 5x5sec hold      Knee/Hip Exercises: Supine   Knee Extension Limitations propped on towel roll HEP demo    Other Supine Knee/Hip Exercises Rt heel slides with strap 5x5 sec hold                    PT Education - 08/11/20 1210     Education Details HEP implemented; ice parameters; eval findings/POC    Person(s) Educated Patient    Methods Explanation;Verbal cues;Handout    Comprehension Verbalized understanding;Returned demonstration              PT Short Term Goals - 08/11/20 1143       PT SHORT TERM GOAL #1   Title Pt will be able to complete sit to stand from mat table with UE support and minimal weight shift to the Lt.    Time 4    Period Weeks    Status New      PT SHORT TERM GOAL #2   Title Pt will have atleast 90 deg knee flexion and lacking no more than 5 deg of knee extension on the Rt    Time 4    Period Weeks    Status New               PT Long Term Goals - 08/11/20 1146       PT LONG TERM GOAL #1   Title Pt will be  able to complete 5x sit to stand in less than 14 sec with or without UE support.    Time 8    Period Weeks    Status New      PT LONG TERM GOAL #2   Title Pt will have atleast 110 deg of Rt knee flexion which will improve her efficiency with sit to stand.    Time 8    Period Weeks    Status New      PT LONG TERM GOAL #3   Title Pt will have full knee extension on the Rt which will improve her ability to  ambulate properly.    Time 8    Period Weeks      PT LONG TERM GOAL #4   Title Pt will report atleast 70% improvement in her pain and mobility from the start of PT.    Time 8      PT LONG TERM GOAL #5   Title Pt will be able to ambulate atleast 1 lap around the clinic with the least restrictive assistive device, demonstrating proper heel/toe sequencing and step length.    Time 8    Period Weeks    Status New                    Plan - 08/12/20 0815     Clinical Impression Statement Pt is a 65 y.o F referred to OPPT s/p Rt TKA on 08/03/20. She is reportedly doing ok at home with  basic ADLs, but does take increased time to complete tasks. Pt has post-op swelling, pain and stiffness as expected at this time. She ambulates with a RW and has antalgic, slow cadence when walking back to the evaluation room. Pt has 60 deg of knee flexion and lacks greater than 20 deg of knee extension at this time. Pt is eager to gain independence and return her her active lifestyle. PT reviewed a series of exercises to increase knee ROM and answered all of pt's questions regarding icing her knee at home. She would benefit from killed PT to address her ROM, strength, and neuromuscular control of the LE and facilitate return to her prior independent/active lifestyle.    Personal Factors and Comorbidities Age;Time since onset of injury/illness/exacerbation;Comorbidity 1    Comorbidities ankle surgery x3 on Rt    Examination-Activity Limitations Stand;Locomotion Level;Transfers;Squat    Examination-Participation Restrictions Yard Work    Stability/Clinical Decision Making Stable/Uncomplicated    Clinical Decision Making Low    Rehab Potential Excellent    PT Frequency 2x / week    PT Duration 8 weeks    PT Treatment/Interventions ADLs/Self Care Home Management;Cryotherapy;Electrical Stimulation;Moist Heat;Therapeutic activities;Stair training;Gait training;Therapeutic exercise;Balance training;Neuromuscular  re-education;Patient/family education;Manual techniques;Scar mobilization;Passive range of motion;Dry needling;Taping    PT Next Visit Plan FOTO, knee circumferential measurement; progress Rt knee ext/flexion ROM as able; begin gait training    PT Home Exercise Plan [email or web address]    Consulted and Agree with Plan of Care Patient             Patient will benefit from skilled therapeutic intervention in order to improve the following deficits and impairments:  Abnormal gait, Pain, Improper body mechanics, Decreased mobility, Decreased scar mobility, Increased muscle spasms, Postural dysfunction, Decreased strength, Decreased range of motion, Decreased endurance, Decreased activity tolerance, Decreased balance, Increased edema, Impaired flexibility, Difficulty walking  Visit Diagnosis: Acute pain of right knee  Localized edema  Stiffness of right knee, not elsewhere classified  Unsteadiness on feet  Problem List Patient Active Problem List   Diagnosis Date Noted   Primary osteoarthritis of knee 08/04/2020   OA (osteoarthritis) of knee 08/03/2020    Donita Brooks 08/12/2020, 8:24 AM  [facility] [address] [place], [place], [address] Phone: [phone]   Fax:  [phone]  Name: Zuleyma Scharf MRN: [id number] Date of Birth: June 12, 1955

## 2020-08-12 NOTE — Discharge Summary (Signed)
Physician Discharge Summary   Patient ID: Jeanette Rodriguez MRN: [id number] DOB/AGE: [birth year] 65 y.o.  Admit date: 08/03/2020 Discharge date: 08/06/2020  Primary Diagnosis: s/p right TKA  Admission Diagnoses:  Past Medical History:  Diagnosis Date   Anxiety    Arthritis    Asthma    GERD (gastroesophageal reflux disease)    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 [place]   Hypertension    Pneumonia    Discharge Diagnoses:   Principal Problem:   OA (osteoarthritis) of knee Active Problems:   Primary osteoarthritis of knee  Estimated body mass index is 39.03 kg/m as calculated from the following:   Height as of this encounter:  (1.575 m).   Weight as of this encounter: 96.8 kg.  Procedure:  Procedure(s) (LRB): TOTAL KNEE ARTHROPLASTY (Right)   Consults: None  HPI: Jeanette Rodriguez is a 65 y.o. year old female with end stage OA of her right knee with progressively worsening pain and dysfunction. She has constant pain, with activity and at rest and significant functional deficits with difficulties even with ADLs. She has had extensive non-op management including analgesics, injections of cortisone and viscosupplements, and home exercise program, but remains in significant pain with significant dysfunction.Radiographs show bone on bone arthritis medial and patellofemoral. She presents now for right Total Knee Arthroplasty.     Laboratory Data: Admission on 08/03/2020, Discharged on 08/06/2020  Component Date Value Ref Range Status   ABO/RH(D) 08/03/2020    Final                   Value:A NEG Performed at [facility], [address] [place], [place] [address]    WBC 08/04/2020 11.6 (A) 4.0 - 10.5 K/uL Final   RBC 08/04/2020 4.11  3.87 - 5.11 MIL/uL Final   Hemoglobin 08/04/2020 12.2  12.0 - 15.0 g/dL Final   HCT 19/14/7829 38.1  36.0 - 46.0 % Final   MCV 08/04/2020 92.7  80.0 - 100.0 fL Final   MCH 08/04/2020 29.7  26.0 - 34.0 pg  Final   MCHC 08/04/2020 32.0  30.0 - 36.0 g/dL Final   RDW 56/21/3086 13.4  11.5 - 15.5 % Final   Platelets 08/04/2020 372  150 - 400 K/uL Final   nRBC 08/04/2020 0.0  0.0 - 0.2 % Final   Performed at [facility], [address], [place], [place] [address]   Sodium 08/04/2020 141  135 - 145 mmol/L Final   Potassium 08/04/2020 3.6  3.5 - 5.1 mmol/L Final   Chloride 08/04/2020 103  98 - 111 mmol/L Final   CO2 08/04/2020 31  22 - 32 mmol/L Final   Glucose, Bld 08/04/2020 134 (A) 70 - 99 mg/dL Final   Glucose reference range applies only to samples taken after fasting for at least 8 hours.   BUN 08/04/2020 15  8 - 23 mg/dL Final   Creatinine, Ser 08/04/2020 0.96  0.44 - 1.00 mg/dL Final   Calcium 96/29/5284 8.8 (A) 8.9 - 10.3 mg/dL Final   GFR, Estimated 08/04/2020 >60  >60 mL/min Final   Comment: (NOTE) Calculated using the CKD-EPI Creatinine Equation (2021)    Anion gap 08/04/2020 7  5 - 15 Final   Performed at [facility], [address], [place], [place] [address]   WBC 08/05/2020 12.2 (A) 4.0 - 10.5 K/uL Final   RBC 08/05/2020 4.09  3.87 - 5.11 MIL/uL Final   Hemoglobin 08/05/2020  12.1  12.0 - 15.0 g/dL Final   HCT 95/10/3265 37.6  36.0 - 46.0 % Final   MCV 08/05/2020 91.9  80.0 - 100.0 fL Final   MCH 08/05/2020 29.6  26.0 - 34.0 pg Final   MCHC 08/05/2020 32.2  30.0 - 36.0 g/dL Final   RDW 12/45/8099 13.5  11.5 - 15.5 % Final   Platelets 08/05/2020 385  150 - 400 K/uL Final   nRBC 08/05/2020 0.0  0.0 - 0.2 % Final   Performed at [facility], [address], [place], [place] [address]   Sodium 08/05/2020 139  135 - 145 mmol/L Final   Potassium 08/05/2020 3.8  3.5 - 5.1 mmol/L Final   Chloride 08/05/2020 100  98 - 111 mmol/L Final   CO2 08/05/2020 30  22 - 32 mmol/L Final   Glucose, Bld 08/05/2020 130 (A) 70 - 99 mg/dL Final   Glucose reference range applies only to samples taken after fasting for at least 8 hours.    BUN 08/05/2020 15  8 - 23 mg/dL Final   Creatinine, Ser 08/05/2020 0.87  0.44 - 1.00 mg/dL Final   Calcium 50/53/9767 9.1  8.9 - 10.3 mg/dL Final   GFR, Estimated 08/05/2020 >60  >60 mL/min Final   Comment: (NOTE) Calculated using the CKD-EPI Creatinine Equation (2021)    Anion gap 08/05/2020 9  5 - 15 Final   Performed at [facility], [address] [place], [place] [address]   WBC 08/06/2020 10.8 (A) 4.0 - 10.5 K/uL Final   RBC 08/06/2020 3.97  3.87 - 5.11 MIL/uL Final   Hemoglobin 08/06/2020 11.7 (A) 12.0 - 15.0 g/dL Final   HCT 79/03/4095 37.1  36.0 - 46.0 % Final   MCV 08/06/2020 93.5  80.0 - 100.0 fL Final   MCH 08/06/2020 29.5  26.0 - 34.0 pg Final   MCHC 08/06/2020 31.5  30.0 - 36.0 g/dL Final   RDW 35/32/9924 13.6  11.5 - 15.5 % Final   Platelets 08/06/2020 372  150 - 400 K/uL Final   nRBC 08/06/2020 0.0  0.0 - 0.2 % Final   Performed at [facility], [address], [place], [place] [address]  Hospital Outpatient Visit on 07/30/2020  Component Date Value Ref Range Status   SARS Coronavirus 2 07/30/2020 NEGATIVE  NEGATIVE Final   Comment: (NOTE) SARS-CoV-2 target nucleic acids are NOT DETECTED.  The SARS-CoV-2 RNA is generally detectable in upper and lower respiratory specimens during the acute phase of infection. Negative results do not preclude SARS-CoV-2 infection, do not rule out co-infections with other pathogens, and should not be used as the sole basis for treatment or other patient management decisions. Negative results must be combined with clinical observations, patient history, and epidemiological information. The expected result is Negative.  Fact Sheet for Patients: HairSlick.no  Fact Sheet for Healthcare Providers: quierodirigir.com  This test is not yet approved or cleared by the [place] FDA and  has been authorized for detection and/or diagnosis of  SARS-CoV-2 by FDA under an Emergency Use Authorization (EUA). This EUA will remain  in effect (meaning this test can be used) for the duration of the COVID-19 declaration under Se                          ction 564(b)(1) of the Act, 21 U.S.C. section 360bbb-3(b)(1), unless the authorization is terminated or revoked sooner.  Performed at [facility],  [address], [place], [place] [address]   Hospital Outpatient Visit on 07/27/2020  Component Date Value Ref Range Status   MRSA, PCR 07/27/2020 NEGATIVE  NEGATIVE Final   Staphylococcus aureus 07/27/2020 NEGATIVE  NEGATIVE Final   Comment: (NOTE) The Xpert SA Assay (FDA approved for NASAL specimens in patients 63 years of age and older), is one component of a comprehensive surveillance program. It is not intended to diagnose infection nor to guide or monitor treatment. Performed at [facility], [address] [place], [place] [address]    WBC 07/27/2020 7.5  4.0 - 10.5 K/uL Final   RBC 07/27/2020 4.72  3.87 - 5.11 MIL/uL Final   Hemoglobin 07/27/2020 14.1  12.0 - 15.0 g/dL Final   HCT 56/21/3086 42.9  36.0 - 46.0 % Final   MCV 07/27/2020 90.9  80.0 - 100.0 fL Final   MCH 07/27/2020 29.9  26.0 - 34.0 pg Final   MCHC 07/27/2020 32.9  30.0 - 36.0 g/dL Final   RDW 57/84/6962 13.8  11.5 - 15.5 % Final   Platelets 07/27/2020 365  150 - 400 K/uL Final   nRBC 07/27/2020 0.0  0.0 - 0.2 % Final   Performed at [facility], [address], [place], [place] [address]   Sodium 07/27/2020 139  135 - 145 mmol/L Final   Potassium 07/27/2020 4.0  3.5 - 5.1 mmol/L Final   Chloride 07/27/2020 100  98 - 111 mmol/L Final   CO2 07/27/2020 28  22 - 32 mmol/L Final   Glucose, Bld 07/27/2020 107 (A) 70 - 99 mg/dL Final   Glucose reference range applies only to samples taken after fasting for at least 8 hours.   BUN 07/27/2020 13  8 - 23 mg/dL Final   Creatinine, Ser 07/27/2020 0.85  0.44 - 1.00 mg/dL Final    Calcium 13/24/4010 9.5  8.9 - 10.3 mg/dL Final   Total Protein 27/25/3664 7.3  6.5 - 8.1 g/dL Final   Albumin 40/34/7425 4.4  3.5 - 5.0 g/dL Final   AST 95/63/8756 18  15 - 41 U/L Final   ALT 07/27/2020 22  0 - 44 U/L Final   Alkaline Phosphatase 07/27/2020 56  38 - 126 U/L Final   Total Bilirubin 07/27/2020 0.7  0.3 - 1.2 mg/dL Final   GFR, Estimated 07/27/2020 >60  >60 mL/min Final   Comment: (NOTE) Calculated using the CKD-EPI Creatinine Equation (2021)    Anion gap 07/27/2020 11  5 - 15 Final   Performed at [facility], [address] [place], [place] [address]   Prothrombin Time 07/27/2020 12.7  11.4 - 15.2 seconds Final   INR 07/27/2020 1.0  0.8 - 1.2 Final   Comment: (NOTE) INR goal varies based on device and disease states. Performed at [facility], [address], [place], [place] [address]    aPTT 07/27/2020 33  24 - 36 seconds Final   Performed at [facility], [address], [place], [place] [address]   ABO/RH(D) 07/27/2020 A NEG   Final   Antibody Screen 07/27/2020 NEG   Final   Sample Expiration 07/27/2020 08/06/2020,2359   Final   Extend sample reason 07/27/2020    Final                   Value:NO TRANSFUSIONS OR PREGNANCY IN THE PAST 3 MONTHS Performed at [facility], [address] [place], [place] [address]  X-Rays:No results found.  EKG: Orders placed or performed during the hospital encounter of 07/27/20   EKG 12 lead per protocol   EKG 12 lead per protocol     Hospital Course: Kolina Kube is a 65 y.o. who was admitted to [facility]. They were brought to the operating room on 08/03/2020 and underwent Procedure(s): TOTAL KNEE ARTHROPLASTY.  Patient tolerated the procedure well and was later transferred to the recovery room and then to the orthopaedic floor for postoperative care. They were given PO and IV analgesics for pain control following their surgery.  They were given 24 hours of postoperative antibiotics of  Anti-infectives (From admission, onward)    Start     Dose/Rate Route Frequency Ordered Stop   08/03/20 1430  ceFAZolin (ANCEF) IVPB 2g/100 mL premix        2 g 200 mL/hr over 30 Minutes Intravenous Every 6 hours 08/03/20 1213 08/03/20 2056   08/03/20 0615  ceFAZolin (ANCEF) IVPB 2g/100 mL premix        2 g 200 mL/hr over 30 Minutes Intravenous On call to O.R. 08/03/20 5643 08/03/20 0900      and started on DVT prophylaxis in the form of Aspirin and TED hose.   PT and OT were ordered for total joint protocol. Discharge planning consulted to help with postop disposition and equipment needs. Patient had an uneventful night on the evening of surgery. They started to get up OOB with therapy on 08/03/20. Continued to work with therapy into POD #2 and Pod #3. Pt was seen during rounds on day two and was ready to go home pending progress with therapy. Pt worked with therapy for six additional sessions and was meeting their goals. She was discharged to home later that day in stable condition.  Diet: Regular diet Activity: WBAT Follow-up: in two weeks Disposition: Home Discharged Condition: good   Discharge Instructions     Call MD / Call 911   Complete by: As directed    If you experience chest pain or shortness of breath, CALL 911 and be transported to the hospital emergency room.  If you develope a fever above 101 F, pus (white drainage) or increased drainage or redness at the wound, or calf pain, call your surgeon's office.   Change dressing   Complete by: As directed    You may remove the bulky bandage (ACE wrap and gauze) two days after surgery. You will have an adhesive waterproof bandage underneath. Leave this in place until your first follow-up appointment.   Constipation Prevention   Complete by: As directed    Drink plenty of fluids.  Prune juice may be helpful.  You may use a stool softener, such as Colace (over the counter)  100 mg twice a day.  Use MiraLax (over the counter) for constipation as needed.   Diet - low sodium heart healthy   Complete by: As directed    Do not put a pillow under the knee. Place it under the heel.   Complete by: As directed    Driving restrictions   Complete by: As directed    No driving for two weeks   Post-operative opioid taper instructions:   Complete by: As directed    POST-OPERATIVE OPIOID TAPER INSTRUCTIONS: It is important to wean off of your opioid medication as soon as possible. If you do not need pain medication after your surgery it is ok to stop day one. Opioids include: Codeine, Hydrocodone(Norco, Vicodin), Oxycodone(Percocet, oxycontin) and hydromorphone  amongst others.  Long term and even short term use of opiods can cause: Increased pain response Dependence Constipation Depression Respiratory depression And more.  Withdrawal symptoms can include Flu like symptoms Nausea, vomiting And more Techniques to manage these symptoms Hydrate well Eat regular healthy meals Stay active Use relaxation techniques(deep breathing, meditating, yoga) Do Not substitute Alcohol to help with tapering If you have been on opioids for less than two weeks and do not have pain than it is ok to stop all together.  Plan to wean off of opioids This plan should start within one week post op of your joint replacement. Maintain the same interval or time between taking each dose and first decrease the dose.  Cut the total daily intake of opioids by one tablet each day Next start to increase the time between doses. The last dose that should be eliminated is the evening dose.      TED hose   Complete by: As directed    Use stockings (TED hose) for three weeks on both leg(s).  You may remove them at night for sleeping.   Weight bearing as tolerated   Complete by: As directed       Allergies as of 08/06/2020       Reactions   Levaquin [levofloxacin]    Leg swelling         Medication List     STOP taking these medications    cyclobenzaprine 10 MG tablet Commonly known as: FLEXERIL   indomethacin 50 MG capsule Commonly known as: INDOCIN   oxyCODONE-acetaminophen 10-325 MG tablet Commonly known as: PERCOCET       TAKE these medications    albuterol (2.5 MG/3ML) 0.083% nebulizer solution Commonly known as: PROVENTIL Take 2.5 mg by nebulization every 6 (six) hours as needed for wheezing or shortness of breath.   ALPRAZolam 0.25 MG tablet Commonly known as: XANAX Take 0.25 mg by mouth 2 (two) times daily as needed for anxiety.   aspirin 325 MG EC tablet Take 1 tablet (325 mg total) by mouth 2 (two) times daily for 20 days. Then take one 81 mg aspirin once a day for three weeks. Then discontinue aspirin.   azelastine 0.1 % nasal spray Commonly known as: ASTELIN Place 1 spray into both nostrils 2 (two) times daily as needed for rhinitis or allergies. Use in each nostril as directed   buPROPion 300 MG 24 hr tablet Commonly known as: WELLBUTRIN XL Take 300 mg by mouth daily.   calcium carbonate 1500 (600 Ca) MG Tabs tablet Commonly known as: OSCAL Take 600 mg of elemental calcium by mouth daily with breakfast.   cholecalciferol 25 MCG (1000 UNIT) tablet Commonly known as: VITAMIN D3 Take 1,000 Units by mouth daily.   clotrimazole-betamethasone cream Commonly known as: LOTRISONE Apply 1 application topically 2 (two) times daily as needed (irritation).   gabapentin 300 MG capsule Commonly known as: NEURONTIN Take a 300 mg capsule three times a day for two weeks following surgery.Then take a 300 mg capsule two times a day for two weeks. Then take a 300 mg capsule once a day for two weeks. Then discontinue.   hydrochlorothiazide 25 MG tablet Commonly known as: HYDRODIURIL Take 25 mg by mouth daily.   HYDROmorphone 2 MG tablet Commonly known as: DILAUDID Take 1-2 tablets (2-4 mg total) by mouth every 6 (six) hours as needed for severe  pain or moderate pain (2mg  for moderate pain, 4mg  for severe pain).   Magnesium 250 MG  Tabs Take 250 mg by mouth daily.   methocarbamol 500 MG tablet Commonly known as: ROBAXIN Take 1 tablet (500 mg total) by mouth every 6 (six) hours as needed for muscle spasms.   montelukast 10 MG tablet Commonly known as: SINGULAIR Take 10 mg by mouth at bedtime.   phentermine 37.5 MG capsule Take 37.5 mg by mouth every morning. Last dose 07-26-20               Discharge Care Instructions  (From admission, onward)           Start     Ordered   08/04/20 0000  Weight bearing as tolerated        08/04/20 0758   08/04/20 0000  Change dressing       Comments: You may remove the bulky bandage (ACE wrap and gauze) two days after surgery. You will have an adhesive waterproof bandage underneath. Leave this in place until your first follow-up appointment.   08/04/20 0758            Follow-up Information     Ollen GrossAluisio, Frank, MD. Schedule an appointment as soon as possible for a visit on 08/18/2020.   Specialty: Orthopedic Surgery Contact information: [address] [place] [id number] [phone]                 Signed: Nelia ShiSean Umair Rosiles, MBA, PA-C Orthopedic Surgery 08/12/2020, 7:22 AM

## 2020-08-12 NOTE — Patient Instructions (Signed)
Access Code: [email or web address] URL: [email or web address] Date: 08/12/2020 Prepared by: [facility]  Exercises Supine Knee Extension Stretch on Towel Roll - 5 x daily - 7 x weekly Supine Heel Slide with Strap - 5 x daily - 7 x weekly - 5 reps - 5 sec hold Side to Side Weight Shift with Unilateral Counter Support - 3 x daily - 7 x weekly - 10 reps Seated Knee Flexion Stretch - 5 x daily - 7 x weekly - 5 reps - 5 sec hold Supine Single Leg Ankle Pumps - 5 x daily - 7 x weekly - 20 reps

## 2020-08-13 ENCOUNTER — Other Ambulatory Visit: Payer: Self-pay

## 2020-08-13 ENCOUNTER — Ambulatory Visit (HOSPITAL_BASED_OUTPATIENT_CLINIC_OR_DEPARTMENT_OTHER): Payer: Medicare Other | Attending: Orthopedic Surgery | Admitting: Physical Therapy

## 2020-08-13 ENCOUNTER — Encounter (HOSPITAL_BASED_OUTPATIENT_CLINIC_OR_DEPARTMENT_OTHER): Payer: Self-pay | Admitting: Physical Therapy

## 2020-08-13 DIAGNOSIS — R2681 Unsteadiness on feet: Secondary | ICD-10-CM | POA: Diagnosis not present

## 2020-08-13 DIAGNOSIS — R600 Localized edema: Secondary | ICD-10-CM

## 2020-08-13 DIAGNOSIS — I10 Essential (primary) hypertension: Secondary | ICD-10-CM | POA: Diagnosis not present

## 2020-08-13 DIAGNOSIS — M25561 Pain in right knee: Secondary | ICD-10-CM

## 2020-08-13 DIAGNOSIS — M25661 Stiffness of right knee, not elsewhere classified: Secondary | ICD-10-CM

## 2020-08-13 DIAGNOSIS — S91309A Unspecified open wound, unspecified foot, initial encounter: Secondary | ICD-10-CM | POA: Diagnosis not present

## 2020-08-13 NOTE — Therapy (Signed)
[facility] [address] [place], [place], [address] Phone: [phone]   Fax:  [phone]  Physical Therapy Treatment  Patient Details  Name: Jeanette Rodriguez MRN: [id number] Date of Birth: 26-Jul-1955 Referring Provider (PT): Nelia Shi, [place]   Encounter Date: 08/13/2020   PT End of Session - 08/13/20 0945     Visit Number 2    Date for PT Re-Evaluation 10/09/20    Authorization Type Medicare A and B    Authorization Time Period 08/11/20 to 10/09/20    Progress Note Due on Visit 10    PT Start Time 0927    PT Stop Time 1014    PT Time Calculation (min) 47 min    Activity Tolerance Patient limited by pain    Behavior During Therapy [facility] for tasks assessed/performed             Past Medical History:  Diagnosis Date   Anxiety    Arthritis    Asthma    GERD (gastroesophageal reflux disease)    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   Subjective Assessment - 08/13/20 0938     Subjective I had a rough night after my first visit.    Patient Stated Goals get an idea of what she should be doing.    Currently in Pain? Yes    Pain Score 6     Pain Location Knee    Pain Orientation Right    Pain Descriptors / Indicators --   hurts   Aggravating Factors  constant, exercises    Pain Relieving Factors ice, meds                [facility] PT Assessment - 08/13/20 0001       Observation/Other Assessments   Focus on Therapeutic Outcomes (FOTO)  n/a- DW[facility] not in FOTO at this time                            [facility] Adult PT Treatment/Exercise - 08/13/20 0001       Knee/Hip Exercises: Aerobic   Nustep 5 min L2      Knee/Hip Exercises: Seated   Other Seated Knee/Hip Exercises heel on stool lower than table- quad sets      Knee/Hip Exercises: Supine   Short Arc Quad Sets Both;20 reps    Heel Slides Limitations active heel slide to quad set in extension      Knee/Hip Exercises: Sidelying   Clams x20 ea      Modalities   Modalities Vasopneumatic      Vasopneumatic   Number Minutes Vasopneumatic  15 minutes    Vasopnuematic Location  Knee    Vasopneumatic Pressure Low    Vasopneumatic Temperature  34                      PT Short Term Goals - 08/11/20 1143       PT SHORT TERM GOAL #  1   Title Pt will be able to complete sit to stand from mat table with UE support and minimal weight shift to the Lt.    Time 4    Period Weeks    Status New      PT SHORT TERM GOAL #2   Title Pt will have atleast 90 deg knee flexion and lacking no more than 5 deg of knee extension on the Rt    Time 4    Period Weeks    Status New               PT Long Term Goals - 08/11/20 1146       PT LONG TERM GOAL #1   Title Pt will be able to complete 5x sit to stand in less than 14 sec with or without UE support.    Time 8    Period Weeks    Status New      PT LONG TERM GOAL #2   Title Pt will have atleast 110 deg of Rt knee flexion which will improve her efficiency with sit to stand.    Time 8    Period Weeks    Status New      PT LONG TERM GOAL #3   Title Pt will have full knee extension on the Rt which will improve her ability to ambulate properly.    Time 8    Period Weeks      PT LONG TERM GOAL #4   Title Pt will report atleast 70% improvement in her pain and mobility from the start of PT.    Time 8      PT LONG TERM GOAL #5   Title Pt will be able to ambulate atleast 1 lap around the clinic with the least restrictive assistive device,  demonstrating proper heel/toe sequencing and step length.    Time 8    Period Weeks    Status New                   Plan - 08/13/20 1004     Clinical Impression Statement Pt reported significant discomfort in exercises today but does like the nustep. VASO applied end of session to address edema and for pain control. she is still lacking full extension and explained the importance of addressing this quickly.    PT Treatment/Interventions ADLs/Self Care Home Management;Cryotherapy;Electrical Stimulation;Moist Heat;Therapeutic activities;Stair training;Gait training;Therapeutic exercise;Balance training;Neuromuscular re-education;Patient/family education;Manual techniques;Scar mobilization;Passive range of motion;Dry needling;Taping    PT Next Visit Plan cont ROM & quad activation, return to aquatics when incision is healed    PT Home Exercise Plan [email or web address]    Consulted and Agree with Plan of Care Patient             Patient will benefit from skilled therapeutic intervention in order to improve the following deficits and impairments:  Abnormal gait, Pain, Improper body mechanics, Decreased mobility, Decreased scar mobility, Increased muscle spasms, Postural dysfunction, Decreased strength, Decreased range of motion, Decreased endurance, Decreased activity tolerance, Decreased balance, Increased edema, Impaired flexibility, Difficulty walking  Visit Diagnosis: Acute pain of right knee  Localized edema  Stiffness of right knee, not elsewhere classified  Unsteadiness on feet     Problem List Patient Active Problem List   Diagnosis Date Noted   Primary osteoarthritis of knee 08/04/2020   OA (osteoarthritis) of knee 08/03/2020   Evoleht Hovatter C. Maliea Grandmaison PT, DPT 08/13/20 10:06 AM  [facility] [phone]  [facility]  [place], [place], [address] Phone: [phone]   Fax:  [phone]  Name: Jeanette Rodriguez MRN: [id number] Date of  Birth: 1955/03/28

## 2020-08-17 ENCOUNTER — Other Ambulatory Visit (HOSPITAL_COMMUNITY)
Admission: RE | Admit: 2020-08-17 | Discharge: 2020-08-17 | Disposition: A | Discharge status: Home / Self Care | Payer: Medicare Other | Source: Ambulatory Visit | Attending: Family Medicine | Admitting: Family Medicine

## 2020-08-17 ENCOUNTER — Other Ambulatory Visit: Payer: Self-pay | Admitting: Family Medicine

## 2020-08-17 DIAGNOSIS — Z1151 Encounter for screening for human papillomavirus (HPV): Secondary | ICD-10-CM | POA: Insufficient documentation

## 2020-08-17 DIAGNOSIS — Z01411 Encounter for gynecological examination (general) (routine) with abnormal findings: Secondary | ICD-10-CM | POA: Diagnosis not present

## 2020-08-18 ENCOUNTER — Ambulatory Visit (HOSPITAL_BASED_OUTPATIENT_CLINIC_OR_DEPARTMENT_OTHER): Payer: Medicare Other | Admitting: Physical Therapy

## 2020-08-18 ENCOUNTER — Other Ambulatory Visit: Payer: Self-pay

## 2020-08-18 ENCOUNTER — Encounter (HOSPITAL_BASED_OUTPATIENT_CLINIC_OR_DEPARTMENT_OTHER): Payer: Self-pay | Admitting: Physical Therapy

## 2020-08-18 DIAGNOSIS — R2681 Unsteadiness on feet: Secondary | ICD-10-CM | POA: Diagnosis not present

## 2020-08-18 DIAGNOSIS — M25561 Pain in right knee: Secondary | ICD-10-CM

## 2020-08-18 DIAGNOSIS — M25661 Stiffness of right knee, not elsewhere classified: Secondary | ICD-10-CM

## 2020-08-18 DIAGNOSIS — R600 Localized edema: Secondary | ICD-10-CM | POA: Diagnosis not present

## 2020-08-18 LAB — CYTOLOGY - PAP
Comment: NEGATIVE
Diagnosis: NEGATIVE
High risk HPV: NEGATIVE

## 2020-08-18 NOTE — Therapy (Signed)
[facility] [address] [place], [place], [address] Phone: [phone]   Fax:  [phone]  Physical Therapy Treatment  Patient Details  Name: Jeanette Rodriguez MRN: [id number] Date of Birth: 17-Apr-1955 Referring Provider (PT): Nelia Shi, [place]   Encounter Date: 08/18/2020   PT End of Session - 08/18/20 1418     Visit Number 3    Number of Visits 17    Date for PT Re-Evaluation 10/09/20    Authorization Type Medicare A and B    PT Start Time 1025    PT Stop Time 1110    PT Time Calculation (min) 45 min    Activity Tolerance Patient tolerated treatment well    Behavior During Therapy [facility] for tasks assessed/performed             Past Medical History:  Diagnosis Date   Anxiety    Arthritis    Asthma    GERD (gastroesophageal reflux disease)    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   Subjective Assessment - 08/18/20 1030     Subjective Pt states she is doing better.  Pt has a MD appt today at 1:00PM.  Pt is 2 weeks and 1 days s/p R TKA.  Pt denies any adverse effects after prior Rx.  Pt states the ice helped.  Pt is using a 3 wheeled walker and states she feels better with the 3 wheeled walker instead of the 2 wheeled walker.  pt has been using this walker for at least a year.  Pt states she is walking a lot better.    Pertinent History Rt ankle surgery x3, Lt knee arthritis-will likely schedule her Lt knee replacement when she is able.    How long can you sit comfortably? improving with sitting    How long can you  walk comfortably? Pt has been trying to walk more at home without walker    Pain Score 6     Pain Location Knee    Pain Orientation Right                [facility] PT Assessment - 08/18/20 0001       ROM / Strength   AROM / PROM / Strength AROM;PROM      AROM   Overall AROM Comments lacking 16 deg in extension.  72 deg in flexion      PROM   Right Knee Flexion 75                           [facility] Adult PT Treatment/Exercise - 08/18/20 0001       Ambulation/Gait   Assistive device --   3 wheeled walker   Gait Comments limited TKE on R with gait      Exercises   Exercises --   Reviewed response to prior Rx, current function, and pain level.  pt performed ther ex to improve pain, strength, ROM, and function.  Assessed ROM  Knee/Hip Exercises: Stretches   Other Knee/Hip Stretches seated knee extension stretch with heel propped on stool      Knee/Hip Exercises: Aerobic   Nustep 3 min L2      Knee/Hip Exercises: Seated   Long Arc Quad 2 sets;10 reps    Long Arc Quad Weight --   AROM     Knee/Hip Exercises: Supine   Quad Sets Limitations x 6 reps with 5sec hold with heel prop    Short Arc Quad Sets 2 sets;10 reps;Right    Heel Slides Limitations supine heel slide with strap 2x10 reps      Manual Therapy   Manual Therapy Passive ROM    Manual therapy comments Pt received supine R knee flexion and extension PROM in supine per pt tolerance and jt stiffness.  Pt received R knee flexion PROM seated at EOT per pt tolerance and jt stiffness.  pt received manual knee extension stretch with heel propped.                    PT Education - 08/18/20 1136     Education Details Reviewed HEP and updated HEP.  Pt received a HEP handout and was educated in correct form and appropriate frequency.  Access Code: [email or web address]  URL: [email or web address]  Date: 08/18/2020  Prepared by: Aaron Edelman    Exercises Supine Short Arc Quad - 2 x daily - 6-7 x  weekly - 2 sets - 10 reps  Seated Long Arc Quad - 1-2 x daily - 6-7 x weekly - 2 sets - 10 reps  Seated Knee Extension Stretch with Chair - 2-3 x daily - 7 x weekly - 1 sets - 1-2 reps - 2 minutes hold.  Educated pt concerning the importance of improving ROM including focusing on gaining extension early. Educated pt to not sit with leg rolled out and knee flexed but to sit with knee straight.  Educated pt concerning objective findings of ROM and compared them to initial eval.    Person(s) Educated Patient    Methods Explanation;Demonstration;Handout;Verbal cues;Tactile cues    Comprehension Verbalized understanding;Returned demonstration              PT Short Term Goals - 08/11/20 1143       PT SHORT TERM GOAL #1   Title Pt will be able to complete sit to stand from mat table with UE support and minimal weight shift to the Lt.    Time 4    Period Weeks    Status New      PT SHORT TERM GOAL #2   Title Pt will have atleast 90 deg knee flexion and lacking no more than 5 deg of knee extension on the Rt    Time 4    Period Weeks    Status New               PT Long Term Goals - 08/11/20 1146       PT LONG TERM GOAL #1   Title Pt will be able to complete 5x sit to stand in less than 14 sec with or without UE support.    Time 8    Period Weeks    Status New      PT LONG TERM GOAL #2   Title Pt will have atleast 110 deg of Rt knee flexion which will improve her efficiency with sit to stand.    Time 8    Period Weeks    Status New  PT LONG TERM GOAL #3   Title Pt will have full knee extension on the Rt which will improve her ability to ambulate properly.    Time 8    Period Weeks      PT LONG TERM GOAL #4   Title Pt will report atleast 70% improvement in her pain and mobility from the start of PT.    Time 8      PT LONG TERM GOAL #5   Title Pt will be able to ambulate atleast 1 lap around the clinic with the least restrictive assistive device, demonstrating proper  heel/toe sequencing and step length.    Time 8    Period Weeks    Status New                   Plan - 08/18/20 1139     Clinical Impression Statement Pt is 2 weeks and 1 day post op and reports that she is doing better.  Pt continues to have deficits in both flexion and extension, especially in extension, though demonstrates improvements in flex and extension ROM based on goniometric measurements.  Pt has pain with PROM and stretching though tolerated it well.  Pt sits with knee flexed and LE in ER and received instruction to sit with leg straight out to improve extension.  Pt performed exercises well with cuing and instruction for correct form and positioning.  Reviewed and updated HEP today and Pt has good understanding.  She responded well to Rx having no increased pain after Rx stating she felt fine.  Pt declined ice and states she will ice at home.    Comorbidities ankle surgery x3 on , L knee pain    PT Treatment/Interventions ADLs/Self Care Home Management;Cryotherapy;Electrical Stimulation;Moist Heat;Therapeutic activities;Stair training;Gait training;Therapeutic exercise;Balance training;Neuromuscular re-education;Patient/family education;Manual techniques;Scar mobilization;Passive range of motion;Dry needling;Taping    PT Next Visit Plan Cont with ROM and ther ex per TKR protocol.  Cont with gait training.  Focus on extension.    PT Home Exercise Plan Access Code: [email or web address]  URL: [email or web address]  Date: 08/18/2020  Prepared by: Aaron Edelman    Exercises Supine Short Arc Quad - 2 x daily - 6-7 x weekly - 2 sets - 10 reps  Seated Long Arc Quad - 1-2 x daily - 6-7 x weekly - 2 sets - 10 reps  Seated Knee Extension Stretch with Chair - 2-3 x daily - 7 x weekly - 1 sets - 1-2 reps - 2 minutes hold.    Consulted and Agree with Plan of Care Patient             Patient will benefit from skilled therapeutic intervention in order to improve the following deficits and  impairments:  Abnormal gait, Pain, Improper body mechanics, Decreased mobility, Decreased scar mobility, Increased muscle spasms, Postural dysfunction, Decreased strength, Decreased range of motion, Decreased endurance, Decreased activity tolerance, Decreased balance, Increased edema, Impaired flexibility, Difficulty walking  Visit Diagnosis: Acute pain of right knee  Stiffness of right knee, not elsewhere classified  Localized edema  Unsteadiness on feet     Problem List Patient Active Problem List   Diagnosis Date Noted   Primary osteoarthritis of knee 08/04/2020   OA (osteoarthritis) of knee 08/03/2020    Audie Clear III PT, DPT  08/18/2020, 5:12 PM  [facility] [address] [place], [place], [address] Phone: [phone]   Fax:  [phone]  Name: Jeanette Rodriguez MRN: [id number] Date of Birth: Oct 07, 1955

## 2020-08-20 ENCOUNTER — Encounter (HOSPITAL_BASED_OUTPATIENT_CLINIC_OR_DEPARTMENT_OTHER): Payer: Self-pay | Admitting: Physical Therapy

## 2020-08-20 ENCOUNTER — Other Ambulatory Visit: Payer: Self-pay

## 2020-08-20 ENCOUNTER — Ambulatory Visit (HOSPITAL_BASED_OUTPATIENT_CLINIC_OR_DEPARTMENT_OTHER): Payer: Medicare Other | Admitting: Physical Therapy

## 2020-08-20 DIAGNOSIS — R2681 Unsteadiness on feet: Secondary | ICD-10-CM

## 2020-08-20 DIAGNOSIS — R600 Localized edema: Secondary | ICD-10-CM | POA: Diagnosis not present

## 2020-08-20 DIAGNOSIS — M25561 Pain in right knee: Secondary | ICD-10-CM | POA: Diagnosis not present

## 2020-08-20 DIAGNOSIS — M25661 Stiffness of right knee, not elsewhere classified: Secondary | ICD-10-CM | POA: Diagnosis not present

## 2020-08-20 NOTE — Therapy (Signed)
[facility] [address] [place], [place], [address] Phone: [phone]   Fax:  [phone]  Physical Therapy Treatment  Patient Details  Name: Jeanette Rodriguez MRN: [id number] Date of Birth: 11-27-1955 Referring Provider (PT): Nelia Shi, [place]   Encounter Date: 08/20/2020   PT End of Session - 08/20/20 1352     Visit Number 4    Number of Visits 17    Date for PT Re-Evaluation 10/09/20    Authorization Type Medicare A and B    Authorization Time Period 08/11/20 to 10/09/20    Progress Note Due on Visit 10    PT Start Time 1235    PT Stop Time 1319    PT Time Calculation (min) 44 min    Activity Tolerance Patient tolerated treatment well    Behavior During Therapy [facility] for tasks assessed/performed             Past Medical History:  Diagnosis Date   Anxiety    Arthritis    Asthma    GERD (gastroesophageal reflux disease)    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   Subjective Assessment - 08/20/20 1237     Subjective Pt saw MD after prior visit and is pleased with pt's progress.  Pt had aquacell bandage removed.  Pt states she was able to bend knee to 90 deg at MD office.  Pt states she had increased soreness, pain, and fatigue after prior Rx though also had MD appt after PT appt and they flexed her knee and measured it also.  Pt used ice and meds after PT and MD appointment.  pt used pain meds prior to today's apptointment.  Pt reports improved ambulation and is not using her walker in her home.  Pt is focusing more on  not allowing LE to roll out and knee to bend with sitting.  pt reports compliance with HEP though forgot to do SAQ.    Pertinent History Rt ankle surgery x3, Lt knee arthritis-will likely schedule her Lt knee replacement when she is able.    Limitations Walking;Standing;House hold activities    How long can you sit comfortably? improving with sitting    How long can you walk comfortably? Pt has been walking more at home without walker    Pain Score 5     Pain Location Knee    Pain Orientation Right                [facility] PT Assessment - 08/20/20 0001       AROM   Overall AROM Comments lacking 14 deg in extension, 80 deg in flexion.  Pt demonstrates extension lacking 12 deg after MT.      Palpation   Palpation comment inspected incision:  incision intact with some scabbing.  bruising in superior incision.  no drainage                           [facility] Adult  PT Treatment/Exercise - 08/20/20 0001       Ambulation/Gait   Gait Comments limited TKE on R with gait      Exercises   Exercises --   Reviewed response to prior Rx, current function, and pain level.  pt performed ther ex to improve pain, strength, ROM, and function.  Assessed ROM.  Reviewed HEP.     Knee/Hip Exercises: Aerobic   Nustep 4 min at L1-2      Knee/Hip Exercises: Standing   Other Standing Knee Exercises Hip abduction x 10-12 reps, R LE with UE assist on rail.  TKE with RTB 2x10 reps    Other Standing Knee Exercises Marching on airex 2x10 reps with bilat UE assist on rail and weight shifts s/s on airex  10 reps with light UT assist.      Knee/Hip Exercises: Seated   Long Arc Quad 2 sets;10 reps    Long Arc Quad Weight --   AROM     Knee/Hip Exercises: Supine   Quad Sets Limitations x10 reps with 5 sec hold    Short Arc Quad Sets 2 sets;10 reps;Right    Heel Slides Limitations supine heel slide with strap 2x10 reps    Straight Leg Raises 2 sets;10 reps      Manual Therapy   Manual Therapy  Passive ROM    Manual therapy comments Pt received supine R knee flexion and extension PROM in supine per pt tolerance and jt stiffness.  Pt received R knee flexion PROM seated at EOT per pt tolerance and jt stiffness.  pt received manual knee extension stretch with heel propped.                    PT Education - 08/20/20 1350     Education Details Educated pt to focus on knee extension ROM and positioning.  Educated pt to cont with HEP as instructed.  Answered pt's questions.  Instructed pt in heel to toe gait training including focusing on heel strike and knee extension.  Educated pt concerning her ROM findings and in comparison to her prior measurements.  Instructed pt in correct form with exercises.    Person(s) Educated Patient    Methods Explanation;Demonstration;Verbal cues;Tactile cues    Comprehension Verbalized understanding;Returned demonstration              PT Short Term Goals - 08/11/20 1143       PT SHORT TERM GOAL #1   Title Pt will be able to complete sit to stand from mat table with UE support and minimal weight shift to the Lt.    Time 4    Period Weeks    Status New      PT SHORT TERM GOAL #2   Title Pt will have atleast 90 deg knee flexion and lacking no more than 5 deg of knee extension on the Rt    Time 4    Period Weeks    Status New               PT Long Term Goals - 08/11/20 1146       PT LONG TERM GOAL #1   Title Pt will be able to complete 5x sit to stand in less than 14 sec with or without UE support.    Time 8    Period Weeks    Status New      PT LONG TERM GOAL #2   Title Pt will have atleast 110 deg  of Rt knee flexion which will improve her efficiency with sit to stand.    Time 8    Period Weeks    Status New      PT LONG TERM GOAL #3   Title Pt will have full knee extension on the Rt which will improve her ability to ambulate properly.    Time 8    Period Weeks      PT LONG TERM GOAL #4   Title Pt will report  atleast 70% improvement in her pain and mobility from the start of PT.    Time 8      PT LONG TERM GOAL #5   Title Pt will be able to ambulate atleast 1 lap around the clinic with the least restrictive assistive device, demonstrating proper heel/toe sequencing and step length.    Time 8    Period Weeks    Status New                   Plan - 08/20/20 1354     Clinical Impression Statement Pt is 2 weeks and 3 days post op.  She is improving with ROM and progressing well with mobility and gait.  Pt is improving with WB'ing thru R LE as evidenced by gait.  She continues to have limited extension though is improving with flexion with gait.  Pt demonstrates improved R knee flex and extension ROM based on goniometric measurements.  She continues to be very limited and tight with extension ROM though improves with exercises and MT.  She demonstrates improved extension AROM after MT.  Pt tolerates PROM and stretching well and required decreased cuing to keep hip down with seated flexion PROM today.  Pt able to perform supine SLR well with quad lag.  Pt responded well to Rx having no increased pain after Rx.  She should cont to benefit from skilled PT services to improve ROM and strength, address goals, and to achieve desired level of function.  pt declined ice and states she will ice at home.    Comorbidities ankle surgery x3 on , L knee pain    PT Treatment/Interventions ADLs/Self Care Home Management;Cryotherapy;Electrical Stimulation;Moist Heat;Therapeutic activities;Stair training;Gait training;Therapeutic exercise;Balance training;Neuromuscular re-education;Patient/family education;Manual techniques;Scar mobilization;Passive range of motion;Dry needling;Taping    PT Next Visit Plan Cont with ROM and ther ex per TKR protocol.  Cont with gait training.  Focus on extension.    PT Home Exercise Plan Access Code: [email or web address]    Consulted and Agree with Plan of Care Patient             Patient  will benefit from skilled therapeutic intervention in order to improve the following deficits and impairments:  Abnormal gait, Pain, Improper body mechanics, Decreased mobility, Decreased scar mobility, Increased muscle spasms, Postural dysfunction, Decreased strength, Decreased range of motion, Decreased endurance, Decreased activity tolerance, Decreased balance, Increased edema, Impaired flexibility, Difficulty walking  Visit Diagnosis: Acute pain of right knee  Stiffness of right knee, not elsewhere classified  Localized edema  Unsteadiness on feet     Problem List Patient Active Problem List   Diagnosis Date Noted   Primary osteoarthritis of knee 08/04/2020   OA (osteoarthritis) of knee 08/03/2020    Audie Clear III PT, DPT 08/20/20 2:26 PM   [facility] [address] [place], [place], [address] Phone: [phone]   Fax:  [phone]  Name: Jeanette Rodriguez MRN: [id number] Date of Birth: [birth year]

## 2020-08-25 ENCOUNTER — Ambulatory Visit (HOSPITAL_BASED_OUTPATIENT_CLINIC_OR_DEPARTMENT_OTHER): Payer: Medicare Other | Attending: Orthopedic Surgery | Admitting: Physical Therapy

## 2020-08-25 ENCOUNTER — Encounter (HOSPITAL_BASED_OUTPATIENT_CLINIC_OR_DEPARTMENT_OTHER): Payer: Self-pay | Admitting: Physical Therapy

## 2020-08-25 ENCOUNTER — Other Ambulatory Visit: Payer: Self-pay

## 2020-08-25 DIAGNOSIS — M25661 Stiffness of right knee, not elsewhere classified: Secondary | ICD-10-CM | POA: Diagnosis present

## 2020-08-25 DIAGNOSIS — R2681 Unsteadiness on feet: Secondary | ICD-10-CM

## 2020-08-25 DIAGNOSIS — R600 Localized edema: Secondary | ICD-10-CM | POA: Diagnosis present

## 2020-08-25 DIAGNOSIS — M25561 Pain in right knee: Secondary | ICD-10-CM | POA: Diagnosis not present

## 2020-08-25 NOTE — Therapy (Signed)
[facility] [address] [place], [place], [address] Phone: [phone]   Fax:  [phone]  Physical Therapy Treatment  Patient Details  Name: Jeanette Rodriguez MRN: [id number] Date of Birth: 20-Jul-1955 Referring Provider (PT): Nelia Shi, [place]   Encounter Date: 08/25/2020   PT End of Session - 08/25/20 1055     Visit Number 5    Number of Visits 17    Date for PT Re-Evaluation 10/09/20    Authorization Type Medicare A and B    Authorization Time Period 08/11/20 to 10/09/20    Progress Note Due on Visit 10    PT Start Time 1015    PT Stop Time 1110    PT Time Calculation (min) 55 min    Activity Tolerance Patient tolerated treatment well    Behavior During Therapy [facility] for tasks assessed/performed             Past Medical History:  Diagnosis Date   Anxiety    Arthritis    Asthma    GERD (gastroesophageal reflux disease)    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   Subjective Assessment - 08/25/20 1016     Subjective my knee is not feeling so hot with the weather coming in. I was out pulling weeds yesterday.    Patient Stated Goals get an idea of what she should be doing.    Currently in Pain? Yes    Pain Score 7     Pain Location Knee    Pain Orientation Right    Pain Descriptors / Indicators Aching;Sore    Aggravating Factors  swelling    Pain Relieving Factors ice, rest                [facility] PT Assessment - 08/25/20 0001       AROM   Overall AROM Comments -10      PROM   Right Knee Flexion 86                            [facility] Adult PT Treatment/Exercise - 08/25/20 0001       Knee/Hip Exercises: Stretches   Knee: Self-Stretch Limitations heel slide with strap      Knee/Hip Exercises: Aerobic   Nustep 5 min L4 UE &LE      Knee/Hip Exercises: Standing   Terminal Knee Extension Limitations pressing ball into wall    Other Standing Knee Exercises stepping over hurdle- fwd & lateral      Knee/Hip Exercises: Supine   Short Arc Quad Sets 2 sets;10 reps;Right    Straight Leg Raises Limitations starting with quad set   -14 quad lag     Knee/Hip Exercises: Sidelying   Hip ABduction Right;2 sets;10 reps;Strengthening      Vasopneumatic   Number Minutes Vasopneumatic  15 minutes    Vasopnuematic Location  Knee    Vasopneumatic Pressure Low    Vasopneumatic Temperature  34  Manual Therapy   Manual Therapy Soft tissue mobilization;Joint mobilization    Joint Mobilization seated traction+flexion    Soft tissue mobilization edema mobilization                      PT Short Term Goals - 08/11/20 1143       PT SHORT TERM GOAL #1   Title Pt will be able to complete sit to stand from mat table with UE support and minimal weight shift to the Lt.    Time 4    Period Weeks    Status New      PT SHORT TERM GOAL #2   Title Pt will have atleast 90 deg knee flexion and lacking no more than 5 deg of knee extension on the Rt    Time 4    Period Weeks    Status New               PT Long Term Goals - 08/11/20 1146       PT LONG TERM GOAL #1   Title Pt will be able to complete 5x sit to stand in less than 14 sec with or without UE support.    Time 8    Period Weeks    Status New      PT LONG TERM GOAL #2   Title Pt will have atleast 110 deg of Rt knee flexion which will improve her efficiency with sit to stand.    Time 8    Period Weeks    Status New      PT LONG TERM GOAL #3   Title Pt will have full knee extension on the Rt which  will improve her ability to ambulate properly.    Time 8    Period Weeks      PT LONG TERM GOAL #4   Title Pt will report atleast 70% improvement in her pain and mobility from the start of PT.    Time 8      PT LONG TERM GOAL #5   Title Pt will be able to ambulate atleast 1 lap around the clinic with the least restrictive assistive device, demonstrating proper heel/toe sequencing and step length.    Time 8    Period Weeks    Status New                   Plan - 08/25/20 1043     Clinical Impression Statement Knee ROM continues to improve and edema is decreasing. incision healing well and will go to pool when appropriate. continued to encourage focus on knee extension to avoid LLD.    PT Treatment/Interventions ADLs/Self Care Home Management;Cryotherapy;Electrical Stimulation;Moist Heat;Therapeutic activities;Stair training;Gait training;Therapeutic exercise;Balance training;Neuromuscular re-education;Patient/family education;Manual techniques;Scar mobilization;Passive range of motion;Dry needling;Taping    PT Next Visit Plan Cont with ROM and ther ex per TKR protocol.  Cont with gait training.  Focus on extension.    PT Home Exercise Plan Access Code: [email or web address]    Consulted and Agree with Plan of Care Patient             Patient will benefit from skilled therapeutic intervention in order to improve the following deficits and impairments:  Abnormal gait, Pain, Improper body mechanics, Decreased mobility, Decreased scar mobility, Increased muscle spasms, Postural dysfunction, Decreased strength, Decreased range of motion, Decreased endurance, Decreased activity tolerance, Decreased balance, Increased edema, Impaired flexibility, Difficulty walking  Visit Diagnosis: Acute pain of right knee  Stiffness  of right knee, not elsewhere classified  Localized edema  Unsteadiness on feet     Problem List Patient Active Problem List   Diagnosis Date Noted   Primary  osteoarthritis of knee 08/04/2020   OA (osteoarthritis) of knee 08/03/2020   Cerrone Debold C. Jaleel Allen PT, DPT 08/25/20 11:53 AM   [facility] [address] [place], [place], [address] Phone: [phone]   Fax:  [phone]  Name: Jeanette Rodriguez MRN: [id number] Date of Birth: Oct 30, 1955

## 2020-08-27 ENCOUNTER — Encounter (HOSPITAL_BASED_OUTPATIENT_CLINIC_OR_DEPARTMENT_OTHER): Payer: Self-pay | Admitting: Physical Therapy

## 2020-08-27 ENCOUNTER — Ambulatory Visit (HOSPITAL_BASED_OUTPATIENT_CLINIC_OR_DEPARTMENT_OTHER): Payer: Medicare Other | Admitting: Physical Therapy

## 2020-08-27 ENCOUNTER — Other Ambulatory Visit: Payer: Self-pay

## 2020-08-27 DIAGNOSIS — M25661 Stiffness of right knee, not elsewhere classified: Secondary | ICD-10-CM

## 2020-08-27 DIAGNOSIS — M25561 Pain in right knee: Secondary | ICD-10-CM | POA: Diagnosis not present

## 2020-08-27 DIAGNOSIS — R600 Localized edema: Secondary | ICD-10-CM

## 2020-08-27 DIAGNOSIS — R2681 Unsteadiness on feet: Secondary | ICD-10-CM

## 2020-08-27 NOTE — Therapy (Signed)
[facility] [address] [place], [place], [address] Phone: [phone]   Fax:  [phone]  Physical Therapy Treatment  Patient Details  Name: Jeanette Rodriguez MRN: [id number] Date of Birth: May 25, 1955 Referring Provider (PT): Nelia Shi, [place]   Encounter Date: 08/27/2020   PT End of Session - 08/27/20 1246     Visit Number 6    Number of Visits 17    Date for PT Re-Evaluation 10/09/20    Authorization Type Medicare A and B    Authorization Time Period 08/11/20 to 10/09/20    Progress Note Due on Visit 10    PT Start Time 1230   treatment began prior to check in   PT Stop Time 1320    PT Time Calculation (min) 50 min    Activity Tolerance Patient tolerated treatment well             Past Medical History:  Diagnosis Date   Anxiety    Arthritis    Asthma    GERD (gastroesophageal reflux disease)    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   Subjective Assessment - 08/27/20 1611     Subjective I have been working on it.                [facility] PT Assessment - 08/27/20 0001       AROM   Overall AROM Comments -8      PROM   Right Knee Flexion 87                           [facility] Adult PT Treatment/Exercise - 08/27/20 0001       Knee/Hip Exercises: Stretches   Knee: Self-Stretch Limitations strap 10x10s      Knee/Hip Exercises: Aerobic   Nustep 5 min UE & LE      Knee/Hip Exercises: Supine   Quad Sets Limitations 5x10s ea    Straight Leg Raises 10 reps    Straight Leg Raise with  External Rotation 10 reps      Knee/Hip Exercises: Sidelying   Hip ABduction 10 reps    Clams 20                      PT Short Term Goals - 08/11/20 1143       PT SHORT TERM GOAL #1   Title Pt will be able to complete sit to stand from mat table with UE support and minimal weight shift to the Lt.    Time 4    Period Weeks    Status New      PT SHORT TERM GOAL #2   Title Pt will have atleast 90 deg knee flexion and lacking no more than 5 deg of knee extension on the Rt    Time 4    Period Weeks    Status New  PT Long Term Goals - 08/11/20 1146       PT LONG TERM GOAL #1   Title Pt will be able to complete 5x sit to stand in less than 14 sec with or without UE support.    Time 8    Period Weeks    Status New      PT LONG TERM GOAL #2   Title Pt will have atleast 110 deg of Rt knee flexion which will improve her efficiency with sit to stand.    Time 8    Period Weeks    Status New      PT LONG TERM GOAL #3   Title Pt will have full knee extension on the Rt which will improve her ability to ambulate properly.    Time 8    Period Weeks      PT LONG TERM GOAL #4   Title Pt will report atleast 70% improvement in her pain and mobility from the start of PT.    Time 8      PT LONG TERM GOAL #5   Title Pt will be able to ambulate atleast 1 lap around the clinic with the least restrictive assistive device, demonstrating proper heel/toe sequencing and step length.    Time 8    Period Weeks    Status New                   Plan - 08/27/20 1612     Clinical Impression Statement Pt presents to PT today with improved feeling of motion and less edema. Advised that I would like to see her flexion to a consistent 90 deg by Monday or I will begin adding more pressure. Will cont to focus on extension.    PT Treatment/Interventions ADLs/Self Care Home Management;Cryotherapy;Electrical Stimulation;Moist Heat;Therapeutic activities;Stair  training;Gait training;Therapeutic exercise;Balance training;Neuromuscular re-education;Patient/family education;Manual techniques;Scar mobilization;Passive range of motion;Dry needling;Taping    PT Next Visit Plan begin assisted flexion, MD update by end of next week, plan to begin aquatics soon if incision healing    PT Home Exercise Plan Access Code: [email or web address]    Consulted and Agree with Plan of Care Patient             Patient will benefit from skilled therapeutic intervention in order to improve the following deficits and impairments:  Abnormal gait, Pain, Improper body mechanics, Decreased mobility, Decreased scar mobility, Increased muscle spasms, Postural dysfunction, Decreased strength, Decreased range of motion, Decreased endurance, Decreased activity tolerance, Decreased balance, Increased edema, Impaired flexibility, Difficulty walking  Visit Diagnosis: Acute pain of right knee  Stiffness of right knee, not elsewhere classified  Localized edema  Unsteadiness on feet     Problem List Patient Active Problem List   Diagnosis Date Noted   Primary osteoarthritis of knee 08/04/2020   OA (osteoarthritis) of knee 08/03/2020    Briyah Wheelwright C. Myrtie Leuthold PT, DPT 08/27/20 4:14 PM   [facility] [address] [place], [place], [address] Phone: [phone]   Fax:  [phone]  Name: Jeanette Rodriguez MRN: [id number] Date of Birth: [birth year]

## 2020-08-31 ENCOUNTER — Other Ambulatory Visit: Payer: Self-pay

## 2020-08-31 ENCOUNTER — Ambulatory Visit (HOSPITAL_BASED_OUTPATIENT_CLINIC_OR_DEPARTMENT_OTHER): Payer: Medicare Other | Admitting: Physical Therapy

## 2020-08-31 ENCOUNTER — Encounter (HOSPITAL_BASED_OUTPATIENT_CLINIC_OR_DEPARTMENT_OTHER): Payer: Self-pay | Admitting: Physical Therapy

## 2020-08-31 DIAGNOSIS — M25661 Stiffness of right knee, not elsewhere classified: Secondary | ICD-10-CM

## 2020-08-31 DIAGNOSIS — M25561 Pain in right knee: Secondary | ICD-10-CM

## 2020-08-31 DIAGNOSIS — R600 Localized edema: Secondary | ICD-10-CM

## 2020-08-31 DIAGNOSIS — R2681 Unsteadiness on feet: Secondary | ICD-10-CM

## 2020-08-31 NOTE — Therapy (Signed)
[facility] [address] [place], [place], [address] Phone: [phone]   Fax:  [phone]  Physical Therapy Treatment  Patient Details  Name: Jeanette Rodriguez MRN: [id number] Date of Birth: [birth year] Referring Provider (PT): Nelia Shi, [place]   Encounter Date: 08/31/2020   PT End of Session - 08/31/20 1315     Visit Number 7    Number of Visits 17    Date for PT Re-Evaluation 10/09/20    Authorization Type Medicare A and B    Progress Note Due on Visit 10    PT Start Time 1300    PT Stop Time 1348    PT Time Calculation (min) 48 min    Activity Tolerance Patient tolerated treatment well    Behavior During Therapy [facility] for tasks assessed/performed             Past Medical History:  Diagnosis Date   Anxiety    Arthritis    Asthma    GERD (gastroesophageal reflux disease)    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   Subjective Assessment - 08/31/20 1305     Subjective Ready to get in the pool.    Currently in Pain? Yes    Pain Score 5     Pain Location Knee    Pain Orientation Right    Pain Descriptors / Indicators Sore    Aggravating Factors  bending too far    Pain Relieving Factors rest, ice                [facility] PT Assessment - 08/31/20 0001       AROM   Overall AROM Comments -5      PROM   Right Knee Flexion 91                           [facility] Adult PT Treatment/Exercise - 08/31/20 0001       Knee/Hip Exercises: Stretches   Knee: Self-Stretch Limitations heel slide with  strap    Other Knee/Hip Stretches step lunge stretch- 8"step      Knee/Hip Exercises: Aerobic   Nustep 5 min L4 UE & LE      Knee/Hip Exercises: Standing   Heel Raises Both;20 reps    Lateral Step Up Limitations lateral step lunge 8" step    SLS with SLR from heel on 8" step    Other Standing Knee Exercises step taps- 8", avoiding circumduction      Knee/Hip Exercises: Prone   Hip Extension Strengthening;20 reps   10 knee ext, 10 knee flexed     Modalities   Modalities Cryotherapy      Cryotherapy   Number Minutes Cryotherapy 10 Minutes    Cryotherapy Location Knee    Type of Cryotherapy Ice pack      Manual Therapy   Manual Therapy Passive ROM    Passive ROM knee extension stretching  PT Short Term Goals - 08/11/20 1143       PT SHORT TERM GOAL #1   Title Pt will be able to complete sit to stand from mat table with UE support and minimal weight shift to the Lt.    Time 4    Period Weeks    Status New      PT SHORT TERM GOAL #2   Title Pt will have atleast 90 deg knee flexion and lacking no more than 5 deg of knee extension on the Rt    Time 4    Period Weeks    Status New               PT Long Term Goals - 08/11/20 1146       PT LONG TERM GOAL #1   Title Pt will be able to complete 5x sit to stand in less than 14 sec with or without UE support.    Time 8    Period Weeks    Status New      PT LONG TERM GOAL #2   Title Pt will have atleast 110 deg of Rt knee flexion which will improve her efficiency with sit to stand.    Time 8    Period Weeks    Status New      PT LONG TERM GOAL #3   Title Pt will have full knee extension on the Rt which will improve her ability to ambulate properly.    Time 8    Period Weeks      PT LONG TERM GOAL #4   Title Pt will report atleast 70% improvement in her pain and mobility from the start of PT.    Time 8      PT LONG TERM GOAL #5   Title Pt will be able to ambulate atleast 1  lap around the clinic with the least restrictive assistive device, demonstrating proper heel/toe sequencing and step length.    Time 8    Period Weeks    Status New                   Plan - 08/31/20 1344     Clinical Impression Statement Able to reach goal of 90 deg today and cont to improve extension. Will begin in pool next week. Most pain in Lt knee limiting progressions.    PT Treatment/Interventions ADLs/Self Care Home Management;Cryotherapy;Electrical Stimulation;Moist Heat;Therapeutic activities;Stair training;Gait training;Therapeutic exercise;Balance training;Neuromuscular re-education;Patient/family education;Manual techniques;Scar mobilization;Passive range of motion;Dry needling;Taping    PT Next Visit Plan PN    PT Home Exercise Plan Access Code: [email or web address]    Consulted and Agree with Plan of Care Patient             Patient will benefit from skilled therapeutic intervention in order to improve the following deficits and impairments:  Abnormal gait, Pain, Improper body mechanics, Decreased mobility, Decreased scar mobility, Increased muscle spasms, Postural dysfunction, Decreased strength, Decreased range of motion, Decreased endurance, Decreased activity tolerance, Decreased balance, Increased edema, Impaired flexibility, Difficulty walking  Visit Diagnosis: Acute pain of right knee  Stiffness of right knee, not elsewhere classified  Localized edema  Unsteadiness on feet     Problem List Patient Active Problem List   Diagnosis Date Noted   Primary osteoarthritis of knee 08/04/2020   OA (osteoarthritis) of knee 08/03/2020    Katerin Negrete C. Samrat Hayward PT, DPT 08/31/20 1:48 PM   [facility] [address] [place],  [place], [address] Phone: [phone]   Fax:  [phone]  Name: Jeanette Rodriguez MRN: [id number] Date of Birth: [birth year]

## 2020-09-01 ENCOUNTER — Encounter (HOSPITAL_BASED_OUTPATIENT_CLINIC_OR_DEPARTMENT_OTHER): Payer: Self-pay | Admitting: Physical Therapy

## 2020-09-03 ENCOUNTER — Other Ambulatory Visit: Payer: Self-pay

## 2020-09-03 ENCOUNTER — Ambulatory Visit (HOSPITAL_BASED_OUTPATIENT_CLINIC_OR_DEPARTMENT_OTHER): Payer: Medicare Other | Admitting: Physical Therapy

## 2020-09-03 ENCOUNTER — Encounter (HOSPITAL_BASED_OUTPATIENT_CLINIC_OR_DEPARTMENT_OTHER): Payer: Self-pay | Admitting: Physical Therapy

## 2020-09-03 DIAGNOSIS — R2681 Unsteadiness on feet: Secondary | ICD-10-CM

## 2020-09-03 DIAGNOSIS — M25661 Stiffness of right knee, not elsewhere classified: Secondary | ICD-10-CM

## 2020-09-03 DIAGNOSIS — M25561 Pain in right knee: Secondary | ICD-10-CM | POA: Diagnosis not present

## 2020-09-03 DIAGNOSIS — R600 Localized edema: Secondary | ICD-10-CM

## 2020-09-04 ENCOUNTER — Encounter (HOSPITAL_BASED_OUTPATIENT_CLINIC_OR_DEPARTMENT_OTHER): Payer: Self-pay | Admitting: Physical Therapy

## 2020-09-04 NOTE — Therapy (Signed)
[facility] [address] [place], [place], [address] Phone: [phone]   Fax:  [phone]  Physical Therapy Treatment Progress Note Reporting Period 08/11/20 to 09/03/20  See note below for Objective Data and Assessment of Progress/Goals.      Patient Details  Name: Jeanette Rodriguez MRN: [id number] Date of Birth: [birth year] Referring Provider (PT): Nelia Shi, [place]   Encounter Date: 09/03/2020   PT End of Session - 09/04/20 2104     Visit Number 8    Number of Visits 17    Date for PT Re-Evaluation 10/09/20    Authorization Type Medicare A and B    Progress Note Due on Visit 10    PT Start Time 1015    PT Stop Time 1110    PT Time Calculation (min) 55 min    Activity Tolerance Patient tolerated treatment well    Behavior During Therapy WFL for tasks assessed/performed             Past Medical History:  Diagnosis Date   Anxiety    Arthritis    Asthma    GERD (gastroesophageal reflux disease)    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   Subjective Assessment - 09/04/20 2103     Subjective I did stairs at home, tried ot lead with Rt. Did squats last night which incr Lt knee pain    Currently in Pain? Yes    Pain Score 5     Pain Location Knee    Pain Orientation Right    Pain Descriptors / Indicators Aching    Aggravating Factors  stairs    Pain Relieving Factors ice                [facility] PT Assessment - 09/04/20 0001       AROM   Overall AROM Comments -1                            [facility] Adult PT Treatment/Exercise - 09/04/20 0001       Knee/Hip Exercises: Stretches   Other Knee/Hip Stretches knee flexion using bike      Knee/Hip Exercises: Sidelying   Hip ABduction Limitations arcs x15      Knee/Hip Exercises: Prone   Other Prone Exercises prone quad set with glut set      Manual Therapy   Manual therapy comments passive stretching    Soft tissue mobilization edema mob to Bakers cyst post Lt knee                      PT Short Term Goals - 09/04/20 2105       PT SHORT TERM GOAL #1   Title Pt will be able to complete sit to stand from mat table with UE support and minimal weight shift to the Lt.    Status Achieved      PT SHORT TERM GOAL #2   Title  Pt will have atleast 90 deg knee flexion and lacking no more than 5 deg of knee extension on the Rt    Status Achieved               PT Long Term Goals - 08/11/20 1146       PT LONG TERM GOAL #1   Title Pt will be able to complete 5x sit to stand in less than 14 sec with or without UE support.    Time 8    Period Weeks    Status New      PT LONG TERM GOAL #2   Title Pt will have atleast 110 deg of Rt knee flexion which will improve her efficiency with sit to stand.    Time 8    Period Weeks    Status New      PT LONG TERM GOAL #3   Title Pt will have full knee extension on the Rt which will improve her ability to ambulate properly.    Time 8    Period Weeks      PT LONG TERM GOAL #4   Title Pt will report atleast 70% improvement in her pain and mobility from the start of PT.    Time 8      PT LONG TERM GOAL #5   Title Pt will be able to ambulate atleast 1 lap around the clinic with the least restrictive assistive device, demonstrating proper heel/toe sequencing and step length.    Time 8    Period Weeks    Status New                   Plan - 09/04/20 2104     Clinical Impression Statement Cont to make improvements. Arrived  without AD. Beginning pool next week.    PT Treatment/Interventions ADLs/Self Care Home Management;Cryotherapy;Electrical Stimulation;Moist Heat;Therapeutic activities;Stair training;Gait training;Therapeutic exercise;Balance training;Neuromuscular re-education;Patient/family education;Manual techniques;Scar mobilization;Passive range of motion;Dry needling;Taping    PT Next Visit Plan aquatics- gross strength & ROM    PT Home Exercise Plan Access Code: [email or web address]    Consulted and Agree with Plan of Care Patient             Patient will benefit from skilled therapeutic intervention in order to improve the following deficits and impairments:  Abnormal gait, Pain, Improper body mechanics, Decreased mobility, Decreased scar mobility, Increased muscle spasms, Postural dysfunction, Decreased strength, Decreased range of motion, Decreased endurance, Decreased activity tolerance, Decreased balance, Increased edema, Impaired flexibility, Difficulty walking  Visit Diagnosis: Acute pain of right knee  Stiffness of right knee, not elsewhere classified  Localized edema  Unsteadiness on feet     Problem List Patient Active Problem List   Diagnosis Date Noted   Primary osteoarthritis of knee 08/04/2020   OA (osteoarthritis) of knee 08/03/2020  Ashante Snelling C. Coron Rossano PT, DPT 09/04/20 9:07 PM   [facility] [address] [place], [place], [address] Phone: [phone]   Fax:  [phone]  Name: Jeanette Rodriguez MRN: [id number] Date of Birth: [birth year]

## 2020-09-08 ENCOUNTER — Encounter (HOSPITAL_BASED_OUTPATIENT_CLINIC_OR_DEPARTMENT_OTHER): Payer: Self-pay | Admitting: Physical Therapy

## 2020-09-08 ENCOUNTER — Other Ambulatory Visit: Payer: Self-pay

## 2020-09-08 ENCOUNTER — Ambulatory Visit (HOSPITAL_BASED_OUTPATIENT_CLINIC_OR_DEPARTMENT_OTHER): Payer: Medicare Other | Admitting: Physical Therapy

## 2020-09-08 DIAGNOSIS — M25661 Stiffness of right knee, not elsewhere classified: Secondary | ICD-10-CM

## 2020-09-08 DIAGNOSIS — M25561 Pain in right knee: Secondary | ICD-10-CM

## 2020-09-08 DIAGNOSIS — R2681 Unsteadiness on feet: Secondary | ICD-10-CM

## 2020-09-08 DIAGNOSIS — Z471 Aftercare following joint replacement surgery: Secondary | ICD-10-CM | POA: Diagnosis not present

## 2020-09-08 DIAGNOSIS — R600 Localized edema: Secondary | ICD-10-CM

## 2020-09-08 DIAGNOSIS — Z96651 Presence of right artificial knee joint: Secondary | ICD-10-CM | POA: Diagnosis not present

## 2020-09-08 NOTE — Therapy (Signed)
[facility] [address] [place], [place], [address] Phone: [phone]   Fax:  [phone]  Physical Therapy Treatment  Patient Details  Name: Jeanette Rodriguez MRN: [id number] Date of Birth: 09/21/1955 Referring Provider (PT): Nelia Shi, [place]   Encounter Date: 09/08/2020   PT End of Session - 09/08/20 1523     Visit Number 9    Number of Visits 17    Date for PT Re-Evaluation 10/09/20    Authorization Type Medicare A and B    Progress Note Due on Visit 10    PT Start Time 1515             Past Medical History:  Diagnosis Date   Anxiety    Arthritis    Asthma    GERD (gastroesophageal reflux disease)    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   Subjective Assessment - 09/08/20 1525     Subjective MD is happy with knee so far. Still have some work to do on straightening.    Patient Stated Goals get an idea of what she should be doing.                     Aquatic exercises: Float on ankle: knee flexion, HS stretch, hip flexor stretch ER/IR foot on ankle Abd/add, flx/ext kicks Side stepping +UE abd/add with water dumbbells Fwd walking/punch with dumbbells- cues for toe off and knee flexion High stepping fwd walking & backward Knee flexion mob on steps Knee flexion/extension push water- knees together                     PT Short Term Goals - 09/04/20 2105       PT SHORT TERM GOAL #1   Title Pt will be able to complete sit to stand from mat table with UE support and minimal  weight shift to the Lt.    Status Achieved      PT SHORT TERM GOAL #2   Title Pt will have atleast 90 deg knee flexion and lacking no more than 5 deg of knee extension on the Rt    Status Achieved               PT Long Term Goals - 08/11/20 1146       PT LONG TERM GOAL #1   Title Pt will be able to complete 5x sit to stand in less than 14 sec with or without UE support.    Time 8    Period Weeks    Status New      PT LONG TERM GOAL #2   Title Pt will have atleast 110 deg of Rt knee flexion which will improve her efficiency with sit to stand.    Time 8    Period Weeks    Status New      PT LONG TERM GOAL #3   Title Pt will have full knee extension on the Rt which will improve  her ability to ambulate properly.    Time 8    Period Weeks      PT LONG TERM GOAL #4   Title Pt will report atleast 70% improvement in her pain and mobility from the start of PT.    Time 8      PT LONG TERM GOAL #5   Title Pt will be able to ambulate atleast 1 lap around the clinic with the least restrictive assistive device, demonstrating proper heel/toe sequencing and step length.    Time 8    Period Weeks    Status New                    Patient will benefit from skilled therapeutic intervention in order to improve the following deficits and impairments:     Visit Diagnosis: No diagnosis found.     Problem List Patient Active Problem List   Diagnosis Date Noted   Primary osteoarthritis of knee 08/04/2020   OA (osteoarthritis) of knee 08/03/2020   Cashmere Harmes C. Tinamarie Przybylski PT, DPT 09/08/20 5:48 PM   [facility] [address] [place], [place], [address] Phone: [phone]   Fax:  [phone]  Name: Jeanette Rodriguez MRN: [id number] Date of Birth: 1955/03/15

## 2020-09-10 ENCOUNTER — Encounter (HOSPITAL_BASED_OUTPATIENT_CLINIC_OR_DEPARTMENT_OTHER): Payer: Self-pay | Admitting: Physical Therapy

## 2020-09-10 ENCOUNTER — Other Ambulatory Visit: Payer: Self-pay

## 2020-09-10 ENCOUNTER — Ambulatory Visit (HOSPITAL_BASED_OUTPATIENT_CLINIC_OR_DEPARTMENT_OTHER): Payer: Medicare Other | Admitting: Physical Therapy

## 2020-09-10 DIAGNOSIS — M25561 Pain in right knee: Secondary | ICD-10-CM | POA: Diagnosis not present

## 2020-09-10 DIAGNOSIS — M25661 Stiffness of right knee, not elsewhere classified: Secondary | ICD-10-CM

## 2020-09-10 DIAGNOSIS — R2681 Unsteadiness on feet: Secondary | ICD-10-CM

## 2020-09-10 DIAGNOSIS — R600 Localized edema: Secondary | ICD-10-CM

## 2020-09-10 NOTE — Therapy (Addendum)
[facility] [address] [place], [place], [address] Phone: [phone]   Fax:  [phone]  Physical Therapy Treatment Progress Note Reporting Period 08/11/20 to 09/10/2020  See note below for Objective Data and Assessment of Progress/Goals.      Patient Details  Name: Jeanette Rodriguez MRN: [id number] Date of Birth: 1955/06/17 Referring Provider (PT): Nelia Shi, [place]   Encounter Date: 09/10/2020   PT End of Session - 09/10/20 1244     Visit Number 10    Number of Visits 17    Date for PT Re-Evaluation 10/09/20    Authorization Type Medicare A and B    Authorization Time Period --    Progress Note Due on Visit 20    PT Start Time 1232    PT Stop Time 1317   non bill from 1258-110 as pool was closed for weather   PT Time Calculation (min) 45 min             Past Medical History:  Diagnosis Date   Anxiety    Arthritis    Asthma    GERD (gastroesophageal reflux disease)    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   Subjective Assessment - 09/10/20 1241     Subjective I was really sore after last visit, the Lt was worse than the Rt.    Currently in Pain? Yes    Pain Score 6    Lt 7 or 8   Pain Location Knee    Pain Orientation Right    Pain Descriptors / Indicators Throbbing              AquaticREHABdocumentation: Pt is unable to tolerate land due to pain or inability to move freely on land without pain and substitution/compensations  and Water will reduce edema around joints through hydrostatic  pressure that allows for improved ROM           Aquatic exercises: walking all directions with arm resist Noodle press down Traction knee flexion with noodle               PT Short Term Goals - 09/04/20 2105       PT SHORT TERM GOAL #1   Title Pt will be able to complete sit to stand from mat table with UE support and minimal weight shift to the Lt.    Status Achieved      PT SHORT TERM GOAL #2   Title Pt will have atleast 90 deg knee flexion and lacking no more than 5 deg of knee extension on the Rt    Status Achieved               PT Long Term Goals - 09/10/20 1246       PT LONG TERM GOAL #1   Title Pt will be able to complete 5x sit to stand in less than 14 sec with or without UE support.    Baseline 11s    Status Achieved  PT LONG TERM GOAL #2   Title Pt will have atleast 110 deg of Rt knee flexion which will improve her efficiency with sit to stand.    Baseline 90 today    Status On-going      PT LONG TERM GOAL #3   Title Pt will have full knee extension on the Rt which will improve her ability to ambulate properly.    Baseline -4    Status On-going      PT LONG TERM GOAL #4   Title Pt will report atleast 70% improvement in her pain and mobility from the start of PT.    Baseline 70-80% better    Status Achieved      PT LONG TERM GOAL #5   Title Pt will be able to ambulate atleast 1 lap around the clinic with the least restrictive assistive device, demonstrating proper heel/toe sequencing and step length.    Status Achieved      Additional Long Term Goals   Additional Long Term Goals Yes      PT LONG TERM GOAL #6   Title pt will be independent with long term land and aquatics programs for long term care and prep for Lt TKA    Baseline will cont to progress as appropriate    Status New                   Plan - 09/10/20 1614     Clinical Impression Statement Began session in the pool and had to end due to weather. pt  changed and came back to clinic for land- based progress note. Pt is making excellent gains in regards to ADL tolerance and gait. Still some progress that needs to be made in ROM as well as functional endurance. The left knee is a limiting factor and she will cont to benefit from use of aquatics in order to challenge strength and endurance of Rt LE while reducing load on LtLE.    PT Treatment/Interventions ADLs/Self Care Home Management;Cryotherapy;Electrical Stimulation;Moist Heat;Therapeutic activities;Stair training;Gait training;Therapeutic exercise;Balance training;Neuromuscular re-education;Patient/family education;Manual techniques;Scar mobilization;Passive range of motion;Dry needling;Taping    PT Next Visit Plan aquatics- gross strength & ROM    PT Home Exercise Plan Access Code: [email or web address], pool: [id number]    Consulted and Agree with Plan of Care Patient             Patient will benefit from skilled therapeutic intervention in order to improve the following deficits and impairments:  Abnormal gait, Pain, Improper body mechanics, Decreased mobility, Decreased scar mobility, Increased muscle spasms, Postural dysfunction, Decreased strength, Decreased range of motion, Decreased endurance, Decreased activity tolerance, Decreased balance, Increased edema, Impaired flexibility, Difficulty walking  Visit Diagnosis: Acute pain of right knee  Stiffness of right knee, not elsewhere classified  Localized edema  Unsteadiness on feet     Problem List Patient Active Problem List   Diagnosis Date Noted   Primary osteoarthritis of knee 08/04/2020   OA (osteoarthritis) of knee 08/03/2020   Puja Caffey C. Marva Hendryx PT, DPT 09/10/20 4:22 PM   [facility] [address] [place], [place], [address] Phone: [phone]   Fax:  [phone]  Name: Victoriah Wilds MRN: [id number] Date of Birth: 01-16-1956

## 2020-09-15 ENCOUNTER — Encounter (HOSPITAL_BASED_OUTPATIENT_CLINIC_OR_DEPARTMENT_OTHER): Payer: Self-pay | Admitting: Physical Therapy

## 2020-09-15 ENCOUNTER — Ambulatory Visit (HOSPITAL_BASED_OUTPATIENT_CLINIC_OR_DEPARTMENT_OTHER): Payer: Medicare Other | Admitting: Physical Therapy

## 2020-09-15 ENCOUNTER — Other Ambulatory Visit: Payer: Self-pay

## 2020-09-15 DIAGNOSIS — R600 Localized edema: Secondary | ICD-10-CM

## 2020-09-15 DIAGNOSIS — R2681 Unsteadiness on feet: Secondary | ICD-10-CM

## 2020-09-15 DIAGNOSIS — M25661 Stiffness of right knee, not elsewhere classified: Secondary | ICD-10-CM

## 2020-09-15 DIAGNOSIS — M25561 Pain in right knee: Secondary | ICD-10-CM

## 2020-09-15 NOTE — Therapy (Signed)
[facility] [address] [place], [place], [address] Phone: [phone]   Fax:  [phone]  Physical Therapy Treatment  Patient Details  Name: Jeanette Rodriguez MRN: [id number] Date of Birth: 03/26/1955 Referring Provider (PT): Nelia Shi, [place]   Encounter Date: 09/15/2020   PT End of Session - 09/15/20 1024     Visit Number 11    Number of Visits 17    Date for PT Re-Evaluation 10/09/20    Authorization Type Medicare A and B    Progress Note Due on Visit 20    PT Start Time 1020    PT Stop Time 1058    PT Time Calculation (min) 38 min    Activity Tolerance Patient tolerated treatment well    Behavior During Therapy WFL for tasks assessed/performed             Past Medical History:  Diagnosis Date   Anxiety    Arthritis    Asthma    GERD (gastroesophageal reflux disease)    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   Subjective Assessment - 09/15/20 1022     Subjective I am feeling stiff this morning.    Currently in Pain? No/denies                AquaticREHABdocumentation: Pt is unable to tolerate land due to pain or inability to move freely on land without pain and substitution/compensations , Water will reduce edema around joints through hydrostatic pressure that allows for improved ROM, and Water will allow for reduced gait deviation due to reduced joint loading through buoyancy to help patient improve posture without excess stress and pain.  Aquatic exercises: Gait training- weight through midline &  trunk rotation via arm swing Knee flexion stretch on stairs 6x10s holds HSS foot on step knee flexion kicks- elbows on side of pool- holding water dumbbell to side of pool to avoid hip flexion Standing hip circles, 2x20 each leg marching with glut set to push down, alternating, float wraps on ankles, one finger for balance support on side of pool    knee flexion 92 deg                   PT Short Term Goals - 09/04/20 2105       PT SHORT TERM GOAL #1   Title Pt will be able to complete sit to stand from mat table with UE support and minimal weight shift to the Lt.    Status Achieved      PT SHORT TERM GOAL #2   Title Pt will have atleast 90 deg knee flexion and lacking no more than 5 deg of knee extension on the Rt    Status Achieved               PT Long Term Goals - 09/10/20 1246       PT LONG TERM GOAL #1   Title Pt will be able to complete 5x sit to stand in  less than 14 sec with or without UE support.    Baseline 11s    Status Achieved      PT LONG TERM GOAL #2   Title Pt will have atleast 110 deg of Rt knee flexion which will improve her efficiency with sit to stand.    Baseline 90 today    Status On-going      PT LONG TERM GOAL #3   Title Pt will have full knee extension on the Rt which will improve her ability to ambulate properly.    Baseline -4    Status On-going      PT LONG TERM GOAL #4   Title Pt will report atleast 70% improvement in her pain and mobility from the start of PT.    Baseline 70-80% better    Status Achieved      PT LONG TERM GOAL #5   Title Pt will be able to ambulate atleast 1 lap around the clinic with the least restrictive assistive device, demonstrating proper heel/toe sequencing and step length.    Status Achieved      Additional Long Term Goals   Additional Long Term Goals Yes      PT LONG TERM GOAL #6   Title pt will be independent with long term land and aquatics programs for long term care and prep for Lt  TKA    Baseline will cont to progress as appropriate    Status New                   Plan - 09/15/20 1059     Clinical Impression Statement focused on gait training today- weight through center of feet, knee flexion in swing through and arm swing+trunk rotation. passive flexion to 92 but only utilizing approx 15 deg in gait. needs to hold flexion stretches longer and requested she move to supine with strap for stretching.    PT Treatment/Interventions ADLs/Self Care Home Management;Cryotherapy;Electrical Stimulation;Moist Heat;Therapeutic activities;Stair training;Gait training;Therapeutic exercise;Balance training;Neuromuscular re-education;Patient/family education;Manual techniques;Scar mobilization;Passive range of motion;Dry needling;Taping    PT Next Visit Plan aquatics- gross strength & ROM, recheck gait    PT Home Exercise Plan Access Code: [email or web address], pool: [id number]    Consulted and Agree with Plan of Care Patient             Patient will benefit from skilled therapeutic intervention in order to improve the following deficits and impairments:  Abnormal gait, Pain, Improper body mechanics, Decreased mobility, Decreased scar mobility, Increased muscle spasms, Postural dysfunction, Decreased strength, Decreased range of motion, Decreased endurance, Decreased activity tolerance, Decreased balance, Increased edema, Impaired flexibility, Difficulty walking  Visit Diagnosis: Acute pain of right knee  Stiffness of right knee, not elsewhere classified  Localized edema  Unsteadiness on feet     Problem List Patient Active Problem List   Diagnosis Date Noted   Primary osteoarthritis of knee 08/04/2020   OA (osteoarthritis) of knee 08/03/2020    Jaryiah Mehlman C. Venesha Petraitis PT, DPT 09/15/20 11:02 AM  [facility] [address] [place], [place], [address] Phone: [phone]   Fax:  [phone]  Name: Jeanette Rodriguez MRN:  [id number] Date of Birth: 1955-12-30

## 2020-09-17 ENCOUNTER — Ambulatory Visit (HOSPITAL_BASED_OUTPATIENT_CLINIC_OR_DEPARTMENT_OTHER): Payer: Medicare Other | Admitting: Physical Therapy

## 2020-09-17 ENCOUNTER — Other Ambulatory Visit: Payer: Self-pay

## 2020-09-17 ENCOUNTER — Encounter (HOSPITAL_BASED_OUTPATIENT_CLINIC_OR_DEPARTMENT_OTHER): Payer: Self-pay | Admitting: Physical Therapy

## 2020-09-17 DIAGNOSIS — M25561 Pain in right knee: Secondary | ICD-10-CM

## 2020-09-17 DIAGNOSIS — R2681 Unsteadiness on feet: Secondary | ICD-10-CM

## 2020-09-17 DIAGNOSIS — R600 Localized edema: Secondary | ICD-10-CM

## 2020-09-17 DIAGNOSIS — M25661 Stiffness of right knee, not elsewhere classified: Secondary | ICD-10-CM

## 2020-09-17 NOTE — Therapy (Signed)
[facility] [address] [place], [place], [address] Phone: [phone]   Fax:  [phone]  Physical Therapy Treatment  Patient Details  Name: Jeanette Rodriguez MRN: [id number] Date of Birth: 1955/06/23 Referring Provider (PT): Nelia Shi, [place]   Encounter Date: 09/17/2020   PT End of Session - 09/17/20 1021     Visit Number 12    Number of Visits 17    Date for PT Re-Evaluation 10/09/20    Authorization Type Medicare A and B    Progress Note Due on Visit 20    PT Start Time 1018    PT Stop Time 1100    PT Time Calculation (min) 42 min             Past Medical History:  Diagnosis Date   Anxiety    Arthritis    Asthma    GERD (gastroesophageal reflux disease)    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   Subjective Assessment - 09/17/20 1020     Subjective worked out yesterday and got stiff but feeling okay today.    Patient Stated Goals get an idea of what she should be doing.    Currently in Pain? No/denies                  AquaticREHABdocumentation: Water will reduce edema around joints through hydrostatic pressure that allows for improved ROM, Hydrostatic pressure also supports joints by unweighting joint load by at least 50 % in 3-4 feet depth water. 80% in chest to neck deep water., and Water will allow for reduced gait deviation due to reduced joint loading through buoyancy to help patient improve posture without excess stress and pain  Heel raises+end range stretch edge of step HS curls foot off  of edge of step Hip abd edge of step Single leg noodle press down- focus on knee flexion at high point Knee flexion/ext with noodle behind thigh Bicycle motion floating on noodle Knee flexion mobs on step Small circles Backward walking                      PT Short Term Goals - 09/04/20 2105       PT SHORT TERM GOAL #1   Title Pt will be able to complete sit to stand from mat table with UE support and minimal weight shift to the Lt.    Status Achieved      PT SHORT TERM GOAL #2   Title Pt will have atleast 90 deg knee flexion and lacking no more than 5 deg of knee extension on the Rt    Status Achieved               PT Long Term Goals - 09/10/20 1246       PT LONG TERM GOAL #1   Title Pt will be able to complete 5x sit to stand in less than 14 sec with or without UE support.    Baseline 11s  Status Achieved      PT LONG TERM GOAL #2   Title Pt will have atleast 110 deg of Rt knee flexion which will improve her efficiency with sit to stand.    Baseline 90 today    Status On-going      PT LONG TERM GOAL #3   Title Pt will have full knee extension on the Rt which will improve her ability to ambulate properly.    Baseline -4    Status On-going      PT LONG TERM GOAL #4   Title Pt will report atleast 70% improvement in her pain and mobility from the start of PT.    Baseline 70-80% better    Status Achieved      PT LONG TERM GOAL #5   Title Pt will be able to ambulate atleast 1 lap around the clinic with the least restrictive assistive device, demonstrating proper heel/toe sequencing and step length.    Status Achieved      Additional Long Term Goals   Additional Long Term Goals Yes      PT LONG TERM GOAL #6   Title pt will be independent with long term land and aquatics programs for long term care and prep for Lt TKA    Baseline will cont to progress as appropriate    Status New                   Plan - 09/17/20 1317      Clinical Impression Statement used step for improved posture in kicks today to focus on large muscle groups surrounding and supporting knee. All exercises done bilaterally while discussing importance of form even though left knee hurts.    PT Treatment/Interventions ADLs/Self Care Home Management;Cryotherapy;Electrical Stimulation;Moist Heat;Therapeutic activities;Stair training;Gait training;Therapeutic exercise;Balance training;Neuromuscular re-education;Patient/family education;Manual techniques;Scar mobilization;Passive range of motion;Dry needling;Taping    PT Next Visit Plan aquatics- gross strength & ROM, recheck gait    PT Home Exercise Plan Access Code: [email or web address], pool: [email or web address]    Consulted and Agree with Plan of Care Patient             Patient will benefit from skilled therapeutic intervention in order to improve the following deficits and impairments:  Abnormal gait, Pain, Improper body mechanics, Decreased mobility, Decreased scar mobility, Increased muscle spasms, Postural dysfunction, Decreased strength, Decreased range of motion, Decreased endurance, Decreased activity tolerance, Decreased balance, Increased edema, Impaired flexibility, Difficulty walking  Visit Diagnosis: Acute pain of right knee  Stiffness of right knee, not elsewhere classified  Localized edema  Unsteadiness on feet     Problem List Patient Active Problem List   Diagnosis Date Noted   Primary osteoarthritis of knee 08/04/2020   OA (osteoarthritis) of knee 08/03/2020    Conner Neiss C. Zeplin Aleshire PT, DPT 09/17/20 1:20 PM   [facility] [address] [place], [place], [address] Phone: [phone]   Fax:  [phone]  Name: Jeanette Rodriguez MRN: [id number] Date of Birth: Nov 10, 1955

## 2020-09-22 ENCOUNTER — Ambulatory Visit (HOSPITAL_BASED_OUTPATIENT_CLINIC_OR_DEPARTMENT_OTHER): Payer: Medicare Other | Attending: Orthopedic Surgery | Admitting: Physical Therapy

## 2020-09-22 ENCOUNTER — Encounter (HOSPITAL_BASED_OUTPATIENT_CLINIC_OR_DEPARTMENT_OTHER): Payer: Self-pay | Admitting: Physical Therapy

## 2020-09-22 ENCOUNTER — Other Ambulatory Visit: Payer: Self-pay

## 2020-09-22 DIAGNOSIS — R600 Localized edema: Secondary | ICD-10-CM | POA: Diagnosis present

## 2020-09-22 DIAGNOSIS — M25561 Pain in right knee: Secondary | ICD-10-CM

## 2020-09-22 DIAGNOSIS — M25661 Stiffness of right knee, not elsewhere classified: Secondary | ICD-10-CM

## 2020-09-22 DIAGNOSIS — R2681 Unsteadiness on feet: Secondary | ICD-10-CM | POA: Diagnosis present

## 2020-09-22 NOTE — Therapy (Signed)
[facility] [address] [place], [place], [address] Phone: [phone]   Fax:  [phone]  Physical Therapy Treatment  Patient Details  Name: Jeanette Rodriguez MRN: [id number] Date of Birth: [birth year] Referring Provider (PT): Nelia Shi, [place]   Encounter Date: 09/22/2020   PT End of Session - 09/22/20 1023     Visit Number 13    Number of Visits 17    Date for PT Re-Evaluation 10/09/20    Authorization Type Medicare A and B    Progress Note Due on Visit 20    PT Start Time 1015    PT Stop Time 1055    PT Time Calculation (min) 40 min    Activity Tolerance Patient tolerated treatment well    Behavior During Therapy [facility] for tasks assessed/performed             Past Medical History:  Diagnosis Date   Anxiety    Arthritis    Asthma    GERD (gastroesophageal reflux disease)    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   Subjective Assessment - 09/22/20 1015     Subjective did yard work over the course of 5 hours yesterday, iced and feels pretty good today.    Currently in Pain? No/denies                [facility] PT Assessment - 09/22/20 0001       PROM   Right Knee Flexion 100                AquaticREHABdocumentation: Water will reduce edema around joints through hydrostatic pressure that allows for improved ROM and Water will allow for reduced gait deviation due to reduced joint loading through buoyancy to help patient improve posture without excess stress and pain.  Pt did water wlking for warmup  prior to session Noodle press down for knee flexion & quad stretch position- noodle under ankle for knee flexion stretch        - added hip flexor stretch with knee flexion holds between Float HS stretch with glut set to return to neutral Small, fast hip circles 3x10 each direction, bil LEs- working to reduce use of UEs; repeated with larger circles Heel raises without UE support Retro stepping with noodle- high stepping, float cuffs on ankles Seated LAQ- float cuff on ankle for hamstring activation Knee flexion stretching- seated with OP from other foot                      PT Short Term Goals - 09/04/20 2105       PT SHORT TERM GOAL #1   Title Pt will be able to complete sit to stand from mat table with UE support and minimal weight shift to the Lt.    Status Achieved      PT SHORT TERM GOAL #2   Title Pt will have atleast 90 deg knee flexion and lacking no more than 5 deg of knee extension  on the Rt    Status Achieved               PT Long Term Goals - 09/10/20 1246       PT LONG TERM GOAL #1   Title Pt will be able to complete 5x sit to stand in less than 14 sec with or without UE support.    Baseline 11s    Status Achieved      PT LONG TERM GOAL #2   Title Pt will have atleast 110 deg of Rt knee flexion which will improve her efficiency with sit to stand.    Baseline 90 today    Status On-going      PT LONG TERM GOAL #3   Title Pt will have full knee extension on the Rt which will improve her ability to ambulate properly.    Baseline -4    Status On-going      PT LONG TERM GOAL #4   Title Pt will report atleast 70% improvement in her pain and mobility from the start of PT.    Baseline 70-80% better    Status Achieved      PT LONG TERM GOAL #5   Title Pt will be able to ambulate atleast 1 lap around the clinic with the least restrictive assistive device, demonstrating proper heel/toe sequencing and step length.    Status Achieved       Additional Long Term Goals   Additional Long Term Goals Yes      PT LONG TERM GOAL #6   Title pt will be independent with long term land and aquatics programs for long term care and prep for Lt TKA    Baseline will cont to progress as appropriate    Status New                   Plan - 09/22/20 1248     Clinical Impression Statement Entered pool using bil hand rails, depth up to 4', temp 88-90 deg. Flexion improved to 100 deg. denies feeling limitations in ADLs from ROM but will cont to stress ROM in HEP.    PT Treatment/Interventions ADLs/Self Care Home Management;Cryotherapy;Electrical Stimulation;Moist Heat;Therapeutic activities;Stair training;Gait training;Therapeutic exercise;Balance training;Neuromuscular re-education;Patient/family education;Manual techniques;Scar mobilization;Passive range of motion;Dry needling;Taping    PT Next Visit Plan aquatics- gross strength & ROM, balance    PT Home Exercise Plan Access Code: [email or web address], pool: [email or web address]    Consulted and Agree with Plan of Care Patient             Patient will benefit from skilled therapeutic intervention in order to improve the following deficits and impairments:  Abnormal gait, Pain, Improper body mechanics, Decreased mobility, Decreased scar mobility, Increased muscle spasms, Postural dysfunction, Decreased strength, Decreased range of motion, Decreased endurance, Decreased activity tolerance, Decreased balance, Increased edema, Impaired flexibility, Difficulty walking  Visit Diagnosis: Acute pain of right knee  Stiffness of right knee, not elsewhere classified  Localized edema  Unsteadiness on feet     Problem List Patient Active Problem List   Diagnosis Date Noted   Primary osteoarthritis of knee 08/04/2020   OA (osteoarthritis) of knee 08/03/2020   Earnie Bechard C. Laporche Martelle PT, DPT 09/22/20 12:51 PM   [facility] [address] [place], [place],  [address] Phone: [phone]   Fax:  [phone]  Name: Jeanette Rodriguez MRN: [id number] Date of Birth: 07/19/1955

## 2020-09-24 ENCOUNTER — Ambulatory Visit (HOSPITAL_BASED_OUTPATIENT_CLINIC_OR_DEPARTMENT_OTHER): Payer: Self-pay | Admitting: Physical Therapy

## 2020-09-25 ENCOUNTER — Other Ambulatory Visit: Payer: Self-pay

## 2020-09-25 ENCOUNTER — Ambulatory Visit (HOSPITAL_BASED_OUTPATIENT_CLINIC_OR_DEPARTMENT_OTHER): Payer: Medicare Other | Admitting: Physical Therapy

## 2020-09-25 ENCOUNTER — Encounter (HOSPITAL_BASED_OUTPATIENT_CLINIC_OR_DEPARTMENT_OTHER): Payer: Self-pay | Admitting: Physical Therapy

## 2020-09-25 DIAGNOSIS — R2681 Unsteadiness on feet: Secondary | ICD-10-CM

## 2020-09-25 DIAGNOSIS — M25561 Pain in right knee: Secondary | ICD-10-CM | POA: Diagnosis not present

## 2020-09-25 DIAGNOSIS — R600 Localized edema: Secondary | ICD-10-CM

## 2020-09-25 DIAGNOSIS — M25661 Stiffness of right knee, not elsewhere classified: Secondary | ICD-10-CM

## 2020-09-25 NOTE — Therapy (Signed)
[facility] [address] [place], [place], [address] Phone: [phone]   Fax:  [phone]  Physical Therapy Treatment  Patient Details  Name: Jeanette Rodriguez MRN: [id number] Date of Birth: June 21, 1955 Referring Provider (PT): Nelia Shi, [place]   Encounter Date: 09/25/2020   PT End of Session - 09/25/20 1016     Visit Number 14    Number of Visits 17    Date for PT Re-Evaluation 10/09/20    Authorization Type Medicare A and B    Authorization Time Period 08/11/20 to 10/09/20    Progress Note Due on Visit 20    PT Start Time 1015    PT Stop Time 1055    PT Time Calculation (min) 40 min             Past Medical History:  Diagnosis Date   Anxiety    Arthritis    Asthma    GERD (gastroesophageal reflux disease)    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    AquaticREHABdocumentation: Water will reduce edema around joints through hydrostatic pressure that allows for improved ROM, Pt requires the buoyancy of water for active assisted exercises with buoyancy supported for strengthening & ROM exercises, and Water will allow for reduced gait deviation due to reduced joint loading through buoyancy to help patient improve posture without excess stress and pain  Entered pool using stairs and unilat and rail, depth up to 4', temp 92 deg.   high step walking with float cuffs- fwd, back, lateral Knee flexion with float cuffs on ankles Hip ext press from knee flexion, with cuffs Hip ER with knee bent, with cuffs heel raises and  stretching on edge of bottom step- bil hand rail for balance Squats- water dumbbells reaching fwd & pressing down by side Long axis hip circles with water dumbbells pressed down by legs Hip ext with noodle under ankle                         PT Short Term Goals - 09/04/20 2105       PT SHORT TERM GOAL #1   Title Pt will be able to complete sit to stand from mat table with UE support and minimal weight shift to the Lt.    Status Achieved      PT SHORT TERM GOAL #2   Title Pt will have atleast 90 deg knee flexion and lacking no more than 5 deg of knee extension on the Rt    Status Achieved               PT Long Term Goals - 09/10/20 1246       PT LONG TERM GOAL #1   Title Pt will be able to complete 5x sit to stand in less than 14 sec with or without UE support.    Baseline 11s    Status Achieved      PT LONG TERM GOAL #2   Title  Pt will have atleast 110 deg of Rt knee flexion which will improve her efficiency with sit to stand.    Baseline 90 today    Status On-going      PT LONG TERM GOAL #3   Title Pt will have full knee extension on the Rt which will improve her ability to ambulate properly.    Baseline -4    Status On-going      PT LONG TERM GOAL #4   Title Pt will report atleast 70% improvement in her pain and mobility from the start of PT.    Baseline 70-80% better    Status Achieved      PT LONG TERM GOAL #5   Title Pt will be able to ambulate atleast 1 lap around the clinic with the least restrictive assistive device, demonstrating proper heel/toe sequencing and step length.    Status Achieved      Additional Long Term Goals   Additional Long Term Goals Yes      PT LONG TERM GOAL #6   Title pt will be independent with long term land and aquatics programs for long term care and prep for Lt TKA    Baseline will cont to progress as appropriate    Status New                   Plan - 09/25/20 1047     Clinical Impression  Statement Pt noticing stress of ROM on ankles in squats as she is able to move further than prior. balance is significantly improved- able to do SLS exercises on Rt LE without Ue support. Still using support when standing on Lt leg due to known OA.    PT Treatment/Interventions ADLs/Self Care Home Management;Cryotherapy;Electrical Stimulation;Moist Heat;Therapeutic activities;Stair training;Gait training;Therapeutic exercise;Balance training;Neuromuscular re-education;Patient/family education;Manual techniques;Scar mobilization;Passive range of motion;Dry needling;Taping    PT Next Visit Plan aquatics- gross strength & ROM, balance    PT Home Exercise Plan Access Code: [email or web address], pool: [id number]    Consulted and Agree with Plan of Care Patient             Patient will benefit from skilled therapeutic intervention in order to improve the following deficits and impairments:  Abnormal gait, Pain, Improper body mechanics, Decreased mobility, Decreased scar mobility, Increased muscle spasms, Postural dysfunction, Decreased strength, Decreased range of motion, Decreased endurance, Decreased activity tolerance, Decreased balance, Increased edema, Impaired flexibility, Difficulty walking  Visit Diagnosis: Acute pain of right knee  Stiffness of right knee, not elsewhere classified  Localized edema  Unsteadiness on feet     Problem List Patient Active Problem List   Diagnosis Date Noted   Primary osteoarthritis of knee 08/04/2020   OA (osteoarthritis) of knee 08/03/2020   [name] C. Montanna Mcbain PT, DPT 09/25/20 12:57 PM   [facility] [address] [place], [place], [address] Phone: [phone]   Fax:  [phone]  Name: Jeanette Rodriguez MRN: [id number] Date of Birth: 1955/08/29

## 2020-09-29 ENCOUNTER — Ambulatory Visit (HOSPITAL_BASED_OUTPATIENT_CLINIC_OR_DEPARTMENT_OTHER): Payer: Medicare Other | Admitting: Physical Therapy

## 2020-09-29 ENCOUNTER — Encounter (HOSPITAL_BASED_OUTPATIENT_CLINIC_OR_DEPARTMENT_OTHER): Payer: Self-pay | Admitting: Physical Therapy

## 2020-09-29 ENCOUNTER — Other Ambulatory Visit: Payer: Self-pay

## 2020-09-29 DIAGNOSIS — R2681 Unsteadiness on feet: Secondary | ICD-10-CM

## 2020-09-29 DIAGNOSIS — R600 Localized edema: Secondary | ICD-10-CM

## 2020-09-29 DIAGNOSIS — M25561 Pain in right knee: Secondary | ICD-10-CM

## 2020-09-29 DIAGNOSIS — M25661 Stiffness of right knee, not elsewhere classified: Secondary | ICD-10-CM

## 2020-09-29 NOTE — Therapy (Signed)
[facility] [address] [place], [place], [address] Phone: [phone]   Fax:  [phone]  Physical Therapy Treatment  Patient Details  Name: Jeanette Rodriguez MRN: [id number] Date of Birth: 1955-03-05 Referring Provider (PT): Nelia Shi, [place]   Encounter Date: 09/29/2020   PT End of Session - 09/29/20 1022     Visit Number 15    Number of Visits 17    Date for PT Re-Evaluation 10/09/20    Authorization Type Medicare A and B    Progress Note Due on Visit 20    PT Start Time 1016    PT Stop Time 1057    PT Time Calculation (min) 41 min    Activity Tolerance Patient tolerated treatment well    Behavior During Therapy WFL for tasks assessed/performed             Past Medical History:  Diagnosis Date   Anxiety    Arthritis    Asthma    GERD (gastroesophageal reflux disease)    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   Subjective Assessment - 09/29/20 1021     Subjective Slept a lot this weekend. did work on on sunday in the pool. knee is very stff when I wake up in the AM.    Patient Stated Goals get an idea of what she should be doing.    Currently in Pain? No/denies                 AquaticREHABdocumentation: Pt is unable to tolerate land due to pain or inability to move freely on land without pain and substitution/compensations , Water will reduce edema around joints through hydrostatic pressure that allows for improved ROM, and Hydrostatic pressure also supports joints by unweighting joint load by at  least 50 % in 3-4 feet depth water. 80% in chest to neck deep water. Resisted walking fwd and back with kick board Large LE circles with hands on kick board Fwd & lateral step ups without UE support- to 2nd step HSS foot on 2nd step knee flexion stretch kneeling on step- water chest deep using stairs Long arc quad swings & seated kicks 3x30 with 2lb weight Heel raises edge of first step Squats- pulling off of hand rails, feet on first step                       PT Short Term Goals - 09/04/20 2105       PT SHORT TERM GOAL #1   Title Pt will be able to complete sit to stand from mat table with UE support and minimal weight shift to the Lt.    Status Achieved      PT SHORT TERM GOAL #2   Title Pt will have atleast 90 deg knee flexion and lacking no more than 5 deg of knee extension on the Rt    Status Achieved  PT Long Term Goals - 09/10/20 1246       PT LONG TERM GOAL #1   Title Pt will be able to complete 5x sit to stand in less than 14 sec with or without UE support.    Baseline 11s    Status Achieved      PT LONG TERM GOAL #2   Title Pt will have atleast 110 deg of Rt knee flexion which will improve her efficiency with sit to stand.    Baseline 90 today    Status On-going      PT LONG TERM GOAL #3   Title Pt will have full knee extension on the Rt which will improve her ability to ambulate properly.    Baseline -4    Status On-going      PT LONG TERM GOAL #4   Title Pt will report atleast 70% improvement in her pain and mobility from the start of PT.    Baseline 70-80% better    Status Achieved      PT LONG TERM GOAL #5   Title Pt will be able to ambulate atleast 1 lap around the clinic with the least restrictive assistive device, demonstrating proper heel/toe sequencing and step length.    Status Achieved      Additional Long Term Goals   Additional Long Term Goals Yes      PT LONG TERM GOAL #6   Title pt will be  independent with long term land and aquatics programs for long term care and prep for Lt TKA    Baseline will cont to progress as appropriate    Status New                   Plan - 09/29/20 2033     Clinical Impression Statement progressed flexion stretching to kneeling position in order to pass 100 deg. stability, awareness and exercise tolerance is improving overall. fewer complaints about Lt knee today but did not stress it in shallow water step ups.    PT Treatment/Interventions ADLs/Self Care Home Management;Cryotherapy;Electrical Stimulation;Moist Heat;Therapeutic activities;Stair training;Gait training;Therapeutic exercise;Balance training;Neuromuscular re-education;Patient/family education;Manual techniques;Scar mobilization;Passive range of motion;Dry needling;Taping    PT Next Visit Plan route PN, try flexion lunge again    PT Home Exercise Plan Access Code: [email or web address], pool: [id number]    Consulted and Agree with Plan of Care Patient             Patient will benefit from skilled therapeutic intervention in order to improve the following deficits and impairments:  Abnormal gait, Pain, Improper body mechanics, Decreased mobility, Decreased scar mobility, Increased muscle spasms, Postural dysfunction, Decreased strength, Decreased range of motion, Decreased endurance, Decreased activity tolerance, Decreased balance, Increased edema, Impaired flexibility, Difficulty walking  Visit Diagnosis: Acute pain of right knee  Stiffness of right knee, not elsewhere classified  Localized edema  Unsteadiness on feet     Problem List Patient Active Problem List   Diagnosis Date Noted   Primary osteoarthritis of knee 08/04/2020   OA (osteoarthritis) of knee 08/03/2020   Kiyoko Mcguirt C. Namrata Dangler PT, DPT 09/29/20 8:36 PM   [facility] [address] [place], [place], [address] Phone: [phone]   Fax:  [phone]  Name:  Sallye Lunz MRN: [id number] Date of Birth: 08/03/1955

## 2020-10-01 ENCOUNTER — Ambulatory Visit (HOSPITAL_BASED_OUTPATIENT_CLINIC_OR_DEPARTMENT_OTHER): Payer: Medicare Other | Admitting: Physical Therapy

## 2020-10-01 ENCOUNTER — Other Ambulatory Visit: Payer: Self-pay

## 2020-10-01 ENCOUNTER — Encounter (HOSPITAL_BASED_OUTPATIENT_CLINIC_OR_DEPARTMENT_OTHER): Payer: Self-pay | Admitting: Physical Therapy

## 2020-10-01 DIAGNOSIS — R600 Localized edema: Secondary | ICD-10-CM

## 2020-10-01 DIAGNOSIS — R2681 Unsteadiness on feet: Secondary | ICD-10-CM

## 2020-10-01 DIAGNOSIS — M25661 Stiffness of right knee, not elsewhere classified: Secondary | ICD-10-CM

## 2020-10-01 DIAGNOSIS — M25561 Pain in right knee: Secondary | ICD-10-CM | POA: Diagnosis not present

## 2020-10-01 NOTE — Therapy (Signed)
[facility] [address] [place], [place], [address] Phone: [phone]   Fax:  [phone]  Physical Therapy Treatment  Patient Details  Name: Jeanette Rodriguez MRN: [id number] Date of Birth: 06-10-1955 Referring Provider (PT): Nelia Shi, [place]   Encounter Date: 10/01/2020   PT End of Session - 10/01/20 1011     Visit Number 16    Number of Visits 17    Date for PT Re-Evaluation 10/09/20    Authorization Type Medicare A and B    Authorization Time Period 08/11/20 to 10/09/20    PT Start Time 1011    PT Stop Time 1057    PT Time Calculation (min) 46 min    Activity Tolerance Patient tolerated treatment well    Behavior During Therapy [facility] for tasks assessed/performed             Past Medical History:  Diagnosis Date   Anxiety    Arthritis    Asthma    GERD (gastroesophageal reflux disease)    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   Subjective Assessment - 10/01/20 1014     Subjective feeling good    Currently in Pain? No/denies                [facility] PT Assessment - 10/01/20 0001       PROM   Right Knee Flexion 95   95 cold beg of treatment              AquaticREHABdocumentation: Water will aid with movement using the current and laminar flow while the buoyancy reduces weight bearing, Pt requires the buoyancy of water for active assisted exercises with buoyancy supported for strengthening & ROM exercises, Pt.requires the viscosity of the water for resistance with strengthening exercises, and  Hydrostatic pressure also supports joints by unweighting joint load by at least 50 % in 3-4 feet depth water. 80% in chest to neck deep water. Jumpingoff side of pool- hingig from bars in deep end Bicycles on noodle LE abd/add seated on noodle, straight leg scissors Kneeling knee flexion Standing HS curls 3lb ankle weight- 2 rounds to fatigue Standing LAQ Long leg circles with 3lb weight                      PT Short Term Goals - 09/04/20 2105       PT SHORT TERM GOAL #1   Title Pt will be able to complete sit to stand from mat table with UE support and minimal weight shift to the Lt.    Status Achieved      PT SHORT TERM GOAL #2   Title Pt will have atleast 90 deg knee flexion and lacking no more than 5 deg of knee extension on the Rt    Status Achieved               PT Long Term Goals - 09/10/20 1246       PT LONG TERM GOAL #  1   Title Pt will be able to complete 5x sit to stand in less than 14 sec with or without UE support.    Baseline 11s    Status Achieved      PT LONG TERM GOAL #2   Title Pt will have atleast 110 deg of Rt knee flexion which will improve her efficiency with sit to stand.    Baseline 90 today    Status On-going      PT LONG TERM GOAL #3   Title Pt will have full knee extension on the Rt which will improve her ability to ambulate properly.    Baseline -4    Status On-going      PT LONG TERM GOAL #4   Title Pt will report atleast 70% improvement in her pain and mobility from the start of PT.    Baseline 70-80% better    Status Achieved      PT LONG TERM GOAL #5   Title Pt will be able to ambulate atleast 1 lap around the clinic with the least restrictive assistive device, demonstrating proper heel/toe sequencing and step length.    Status Achieved      Additional Long Term Goals   Additional Long Term Goals Yes      PT LONG TERM GOAL #6   Title pt will be independent with long term land and aquatics programs for long  term care and prep for Lt TKA    Baseline will cont to progress as appropriate    Status New                   Plan - 10/01/20 1058     Clinical Impression Statement 95 deg cold bend prior to session and ended at 105. Deep water exercises for long arc strength challenges and mobility wihtout weight bearing.    PT Treatment/Interventions ADLs/Self Care Home Management;Cryotherapy;Electrical Stimulation;Moist Heat;Therapeutic activities;Stair training;Gait training;Therapeutic exercise;Balance training;Neuromuscular re-education;Patient/family education;Manual techniques;Scar mobilization;Passive range of motion;Dry needling;Taping    PT Next Visit Plan first visit next week in pool, second visit will be ERO v D/C    PT Home Exercise Plan Access Code: [email or web address], pool: [id number]    Consulted and Agree with Plan of Care Patient             Patient will benefit from skilled therapeutic intervention in order to improve the following deficits and impairments:  Abnormal gait, Pain, Improper body mechanics, Decreased mobility, Decreased scar mobility, Increased muscle spasms, Postural dysfunction, Decreased strength, Decreased range of motion, Decreased endurance, Decreased activity tolerance, Decreased balance, Increased edema, Impaired flexibility, Difficulty walking  Visit Diagnosis: Acute pain of right knee  Stiffness of right knee, not elsewhere classified  Localized edema  Unsteadiness on feet     Problem List Patient Active Problem List   Diagnosis Date Noted   Primary osteoarthritis of knee 08/04/2020   OA (osteoarthritis) of knee 08/03/2020   Daymon Hora C. Kendria Halberg PT, DPT 10/01/20 11:00 AM  [facility] [address] [place], [place], [address] Phone: [phone]   Fax:  [phone]  Name: Aubery Date MRN: [id number] Date of Birth: [birth year]

## 2020-10-05 ENCOUNTER — Other Ambulatory Visit: Payer: Self-pay

## 2020-10-05 ENCOUNTER — Ambulatory Visit (HOSPITAL_BASED_OUTPATIENT_CLINIC_OR_DEPARTMENT_OTHER): Payer: Medicare Other | Admitting: Physical Therapy

## 2020-10-05 DIAGNOSIS — R2681 Unsteadiness on feet: Secondary | ICD-10-CM

## 2020-10-05 DIAGNOSIS — M25661 Stiffness of right knee, not elsewhere classified: Secondary | ICD-10-CM

## 2020-10-05 DIAGNOSIS — R600 Localized edema: Secondary | ICD-10-CM

## 2020-10-05 DIAGNOSIS — M25561 Pain in right knee: Secondary | ICD-10-CM

## 2020-10-05 NOTE — Therapy (Addendum)
[facility] [address] 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                                PT Short Term Goals - 09/04/20 2105       PT SHORT TERM GOAL #1   Title Pt will be able to complete sit to stand from mat table with UE support and minimal weight shift to the Lt.    Status Achieved      PT SHORT TERM GOAL #2   Title Pt will have atleast 90 deg knee flexion and lacking no more than 5 deg of knee extension on the Rt    Status Achieved               PT Long Term Goals - 09/10/20 1246       PT LONG TERM GOAL #1   Title Pt will be able to complete 5x sit to stand in less than 14 sec with or without UE support.    Baseline 11s    Status Achieved      PT LONG TERM GOAL #2   Title Pt will have atleast 110 deg of Rt knee flexion which will improve her efficiency with sit to stand.    Baseline 90 today    Status On-going      PT LONG TERM GOAL #3   Title Pt will have full knee extension on the Rt which will improve her ability to ambulate properly.    Baseline -4    Status On-going      PT LONG TERM GOAL #4    Title Pt will report atleast 70% improvement in her pain and mobility from the start of PT.    Baseline 70-80% better    Status Achieved      PT LONG TERM GOAL #5   Title Pt will be able to ambulate atleast 1 lap around the clinic with the least restrictive assistive device, demonstrating proper heel/toe sequencing and step length.    Status Achieved      Additional Long Term Goals   Additional Long Term Goals Yes      PT LONG TERM GOAL #6   Title pt will be independent with long term land and aquatics programs for long term care and prep for Lt TKA    Baseline will cont to progress as appropriate    Status New           AquaticREHABdocumentation: Water will aid with movement using  the current and laminar flow while the buoyancy reduces weight bearing, Pt requires the buoyancy of water for active assisted exercises with buoyancy supported for strengthening & ROM exercises, Pt.requires the viscosity of the water for resistance with strengthening exercises, and Hydrostatic pressure also supports joints by unweighting joint load by at least 50 % in 3-4 feet depth water. 80% in chest to neck deep water.  Pt seen for aquatic therapy today.  Treatment took place in water 3.25-4.8 ft in depth at the [facility] pool. Temp of water was 91.  Pt entered/exited the pool via stairs step to pattern independently with bilat rail. Warm up: forward, backward and side stepping/walking cues for increased step length, increased speed, hand placement to increase resistance.  Seated Stretching knee flex and extension; AAROM into knee flex seated on water step (scooting towards edge of step right foot blocked)  Standing  Step ups clearing 1 step then progressed to 2, 10 reps leading with each LE, cues for eccentric control and posture. Squats on 2nd to bottom step the on 3rd from bottom step. VC and demonstration for proper executing.  Hamstring and calf stretching/knee extension standing feet  on upright of step, holding to handrail jack knifing back   Suspended in supine Pushing off from wall, therapist supporting and positioning pt into 90 deg+ of knee flex providing resistance  Bicylicing seated on yellow noodle; 20 fast rotations f/b 20 slow x 7 trials. Scissoring 20 fast reps then 20 slow x 2 trails. (Aerobic capacity benefit as well as strengthening and ROM).  Water walking: forward cues for increased step length, knee flex and speed x 6 widths forward and 6 widths back.   Pt requires buoyancy for support and to offload joints with strengthening exercises. Viscosity of the water is needed for resistance of strengthening; water current perturbations provides challenge to standing balance unsupported, requiring increased core activation         Patient will benefit from skilled therapeutic intervention in order to improve the following deficits and impairments:     Visit Diagnosis: No diagnosis found.     Problem List Patient Active Problem List   Diagnosis Date Noted   Primary osteoarthritis of knee 08/04/2020   OA (osteoarthritis) of knee 08/03/2020    Jeanmarie Hubert  MPT 10/05/2020, 10:34 AM  [facility] [address] [place], [place], [address] Phone: [phone]   Fax:  [phone]  Name: Ettel Albergo MRN: [id number] Date of Birth: 1955/09/03

## 2020-10-07 ENCOUNTER — Ambulatory Visit (HOSPITAL_BASED_OUTPATIENT_CLINIC_OR_DEPARTMENT_OTHER): Payer: Medicare Other | Admitting: Physical Therapy

## 2020-10-07 ENCOUNTER — Other Ambulatory Visit: Payer: Self-pay

## 2020-10-07 ENCOUNTER — Encounter (HOSPITAL_BASED_OUTPATIENT_CLINIC_OR_DEPARTMENT_OTHER): Payer: Self-pay | Admitting: Physical Therapy

## 2020-10-07 DIAGNOSIS — M25661 Stiffness of right knee, not elsewhere classified: Secondary | ICD-10-CM

## 2020-10-07 DIAGNOSIS — M25561 Pain in right knee: Secondary | ICD-10-CM | POA: Diagnosis not present

## 2020-10-07 DIAGNOSIS — R600 Localized edema: Secondary | ICD-10-CM

## 2020-10-07 DIAGNOSIS — R2681 Unsteadiness on feet: Secondary | ICD-10-CM

## 2020-10-07 NOTE — Therapy (Addendum)
[facility] [address] [place], [place], [address] Phone: [phone]   Fax:  [phone]  Physical Therapy Treatment/ERO/Discharge  Patient Details  Name: Jeanette Rodriguez MRN: [id number] Date of Birth: 03/31/1955 Referring Provider (PT): Fenton Foy, [place]   Encounter Date: 10/07/2020   PT End of Session - 10/07/20 1017     Visit Number 18    Number of Visits 22    Date for PT Re-Evaluation 11/06/20    Authorization Type Medicare A and B    Progress Note Due on Visit 20    PT Start Time 5809    PT Stop Time 1059    PT Time Calculation (min) 44 min    Activity Tolerance Patient tolerated treatment well    Behavior During Therapy WFL for tasks assessed/performed             Past Medical History:  Diagnosis Date   Anxiety    Arthritis    Asthma    GERD (gastroesophageal reflux disease)    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   Subjective Assessment - 10/07/20 1016     Subjective Did a lot of stairs yesterday.    Patient Stated Goals get an idea of what she should be doing.    Currently in Pain? No/denies                [facility] PT Assessment - 10/07/20 0001       Assessment   Medical Diagnosis pain in Rt knee, s/p Rt TKA    Referring Provider (PT) Fenton Foy, PA-C    Onset Date/Surgical Date 08/03/20      Balance Screen   Has the patient fallen in the past 6 months No      Prior Function   Level of Independence Independent    Vocation Retired      Associate Professor   Overall Cognitive Status  Within Functional Limits for tasks assessed      PROM   Right Knee Flexion 100      Ambulation/Gait   Gait Comments lateral trunk lean with limited rotation in gait                           [facility] Adult PT Treatment/Exercise - 10/07/20 0001       Knee/Hip Exercises: Stretches   Knee: Self-Stretch Limitations heel slide with strap    Gastroc Stretch Limitations slant board x2      Knee/Hip Exercises: Aerobic   Stationary Bike 4-5 min warmup      Knee/Hip Exercises: Standing   Gait Training trunk rotation      Knee/Hip Exercises: Sidelying   Hip ABduction Limitations arcs x15 2lb weights      Knee/Hip Exercises: Prone   Other Prone Exercises HS curl 2lb                    PT Education - 10/07/20 1303     Education  Details goals, POC    Person(s) Educated Patient    Methods Explanation    Comprehension Verbalized understanding;Need further instruction              PT Short Term Goals - 09/04/20 2105       PT SHORT TERM GOAL #1   Title Pt will be able to complete sit to stand from mat table with UE support and minimal weight shift to the Lt.    Status Achieved      PT SHORT TERM GOAL #2   Title Pt will have atleast 90 deg knee flexion and lacking no more than 5 deg of knee extension on the Rt    Status Achieved               PT Long Term Goals - 10/07/20 1019       PT LONG TERM GOAL #1   Title Pt will be able to complete 5x sit to stand in less than 14 sec with or without UE support.    Status Achieved      PT LONG TERM GOAL #2   Title Pt will have atleast 110 deg of Rt knee flexion which will improve her efficiency with sit to stand.    Baseline 110 today    Status Achieved      PT LONG TERM GOAL #3   Title Pt will have full knee extension on the Rt which will improve her ability to ambulate properly.    Status Achieved      PT LONG TERM GOAL #4   Title Pt will report atleast 70% improvement in her pain and mobility  from the start of PT.    Status Achieved      PT LONG TERM GOAL #5   Title Pt will be able to ambulate atleast 1 lap around the clinic with the least restrictive assistive device, demonstrating proper heel/toe sequencing and step length.    Status Achieved      Additional Long Term Goals   Additional Long Term Goals Yes      PT LONG TERM GOAL #6   Title pt will be independent with long term land and aquatics programs for long term care and prep for Lt TKA    Baseline will cont to progress for stability & increasing ROM at 1/week for 4 weeks    Status On-going      PT LONG TERM GOAL #7   Title pt will be able to don socks and shoes without limitation by knee flexibility    Time 4    Period Weeks      PT LONG TERM GOAL #8   Title pt will be able to don shorts in a standing position    Baseline unable due to limited strength & flexibility    Time 4    Period Weeks    Status New                   Plan - 10/07/20 1306     Clinical Impression Statement Pt has made significant progress since beginning PT as outlined in the goals section. However; she is still lacking ROM & strength to what is necessary for certain ADLs. Will also finalize HEP with consideration of need for TKA on Lt side while continuing to progress strength and function of Rt.    PT Frequency 1x / week    PT Duration 4 weeks    PT Treatment/Interventions ADLs/Self Care Home  Management;Cryotherapy;Electrical Stimulation;Moist Heat;Therapeutic activities;Stair training;Gait training;Therapeutic exercise;Balance training;Neuromuscular re-education;Patient/family education;Manual techniques;Scar mobilization;Passive range of motion;Dry needling;Taping    PT Next Visit Plan cont in pool- ROM, balance, trunk rotation with gait    PT Home Exercise Plan Access Code: [email or web address], pool: [email or web address]    Consulted and Agree with Plan of Care Patient             Patient will benefit from skilled therapeutic intervention  in order to improve the following deficits and impairments:  Abnormal gait, Pain, Improper body mechanics, Decreased mobility, Decreased scar mobility, Increased muscle spasms, Postural dysfunction, Decreased strength, Decreased range of motion, Decreased endurance, Decreased activity tolerance, Decreased balance, Increased edema, Impaired flexibility, Difficulty walking  Visit Diagnosis: Acute pain of right knee  Stiffness of right knee, not elsewhere classified  Localized edema  Unsteadiness on feet     Problem List Patient Active Problem List   Diagnosis Date Noted   Primary osteoarthritis of knee 08/04/2020   OA (osteoarthritis) of knee 08/03/2020   Jyll Tomaro C. Jame Morrell PT, DPT 10/07/20 1:16 PM   [facility] [address] [place], [place], [address] Phone: [phone]   Fax:  [phone]  Name: Jeanette Rodriguez MRN: [id number] Date of Birth: 07/21/1955  PHYSICAL THERAPY DISCHARGE SUMMARY  Visits from Start of Care: 18  Current functional level related to goals / functional outcomes: See above   Remaining deficits: See above   Education / Equipment: Anatomy of condition, POC, HEP, exercise form/rationale   Patient agrees to discharge. Patient goals were met. Patient is being discharged due to the patient's request. Pt feels prepared to perform HEP independently.  Aleysha Meckler C. Janyah Singleterry PT, DPT 10/13/20 8:17 AM

## 2020-10-12 ENCOUNTER — Encounter (HOSPITAL_BASED_OUTPATIENT_CLINIC_OR_DEPARTMENT_OTHER): Payer: Self-pay | Admitting: Physical Therapy

## 2020-10-14 ENCOUNTER — Ambulatory Visit (HOSPITAL_BASED_OUTPATIENT_CLINIC_OR_DEPARTMENT_OTHER): Payer: Self-pay | Admitting: Physical Therapy

## 2020-10-20 ENCOUNTER — Ambulatory Visit (HOSPITAL_BASED_OUTPATIENT_CLINIC_OR_DEPARTMENT_OTHER): Payer: Self-pay | Admitting: Physical Therapy

## 2020-10-27 ENCOUNTER — Ambulatory Visit (HOSPITAL_BASED_OUTPATIENT_CLINIC_OR_DEPARTMENT_OTHER): Payer: Self-pay | Admitting: Physical Therapy

## 2020-10-27 ENCOUNTER — Ambulatory Visit (HOSPITAL_BASED_OUTPATIENT_CLINIC_OR_DEPARTMENT_OTHER): Payer: Federal, State, Local not specified - PPO | Admitting: Physical Therapy

## 2020-11-04 ENCOUNTER — Ambulatory Visit (HOSPITAL_BASED_OUTPATIENT_CLINIC_OR_DEPARTMENT_OTHER): Payer: Self-pay | Admitting: Physical Therapy

## 2021-05-06 ENCOUNTER — Other Ambulatory Visit: Payer: Self-pay

## 2021-05-06 ENCOUNTER — Emergency Department (HOSPITAL_BASED_OUTPATIENT_CLINIC_OR_DEPARTMENT_OTHER): Payer: Medicare Other

## 2021-05-06 ENCOUNTER — Emergency Department (HOSPITAL_BASED_OUTPATIENT_CLINIC_OR_DEPARTMENT_OTHER)
Admission: EM | Admit: 2021-05-06 | Discharge: 2021-05-06 | Disposition: A | Discharge status: Home / Self Care | Payer: Medicare Other | Attending: Emergency Medicine | Admitting: Emergency Medicine

## 2021-05-06 ENCOUNTER — Encounter (HOSPITAL_BASED_OUTPATIENT_CLINIC_OR_DEPARTMENT_OTHER): Payer: Self-pay | Admitting: Emergency Medicine

## 2021-05-06 DIAGNOSIS — S6991XA Unspecified injury of right wrist, hand and finger(s), initial encounter: Secondary | ICD-10-CM | POA: Diagnosis present

## 2021-05-06 DIAGNOSIS — S61411A Laceration without foreign body of right hand, initial encounter: Secondary | ICD-10-CM | POA: Insufficient documentation

## 2021-05-06 DIAGNOSIS — W293XXA Contact with powered garden and outdoor hand tools and machinery, initial encounter: Secondary | ICD-10-CM | POA: Diagnosis not present

## 2021-05-06 MED ORDER — OXYCODONE-ACETAMINOPHEN 5-325 MG PO TABS
1.0000 | ORAL_TABLET | ORAL | Status: DC | PRN
  Administered 2021-05-06: 1 via ORAL
  Filled 2021-05-06: qty 1

## 2021-05-06 MED ORDER — LIDOCAINE HCL (PF) 1 % IJ SOLN
10.0000 mL | Freq: Once | INTRAMUSCULAR | Status: AC
  Administered 2021-05-06: 10 mL
  Filled 2021-05-06: qty 10

## 2021-05-06 NOTE — ED Notes (Signed)
States got tetanus Oct 2022 ?

## 2021-05-06 NOTE — ED Notes (Signed)
Pt was using an electric chainsaw and cut her proximal rt. inde ?

## 2021-05-06 NOTE — ED Triage Notes (Addendum)
Pt from home. Pt  cut her self with an electric chain saw approx 30 minutes ago. Pt's injury is to the posterior proximal phlanax. Pt is not on thinners. Pt has good cap refill. ?

## 2021-05-06 NOTE — Discharge Instructions (Signed)
You can follow-up with either your primary doctor, your orthopedic office or return here in 10 to 14 days for wound evaluation and likely suture removal.  Recommend changing dressings as discussed.  If you develop redness, drainage, fever, or other new concerning symptom, please return to ER for reassessment. ?

## 2021-05-06 NOTE — ED Provider Notes (Signed)
?[facility] DE[facility] ?Provider Note ? ? ?CSN: [id number] ?Arrival date & time: 05/06/21  1354 ? ?  ? ?History ? ?Chief Complaint  ?Patient presents with  ? Finger Injury  ? ? ?[name] is a 66 y.o. female.  Presenting to the ER with concern for finger injury.  Using electric chainsaw, cut her right hand at base of right index finger.  Was having severe pain earlier but this has improved.  Bleeding was stopped with direct pressure.  States her tetanus was last updated in 2022.  She denies any loss of sensation or difficulty with moving her fingers. ? ?Left hand dominant. ? ? ? ?HPI ? ?  ? ?Home Medications ?Prior to Admission medications   ?Medication Sig Start Date End Date Taking? Authorizing Provider  ?albuterol (PROVENTIL) (2.5 MG/3ML) 0.083% nebulizer solution Take 2.5 mg by nebulization every 6 (six) hours as needed for wheezing or shortness of breath.    [provider]  ?ALPRAZolam Duanne Moron) 0.25 MG tablet Take 0.25 mg by mouth 2 (two) times daily as needed for anxiety.    [provider]  ?azelastine (ASTELIN) 0.1 % nasal spray Place 1 spray into both nostrils 2 (two) times daily as needed for rhinitis or allergies. Use in each nostril as directed    [provider]  ?buPROPion (WELLBUTRIN XL) 300 MG 24 hr tablet Take 300 mg by mouth daily.    [provider]  ?calcium carbonate (OSCAL) 1500 (600 Ca) MG TABS tablet Take 600 mg of elemental calcium by mouth daily with breakfast.    [provider]  ?cholecalciferol (VITAMIN D3) 25 MCG (1000 UNIT) tablet Take 1,000 Units by mouth daily.    [provider]  ?clotrimazole-betamethasone (LOTRISONE) cream Apply 1 application topically 2 (two) times daily as needed (irritation).    [provider]  ?gabapentin (NEURONTIN) 300 MG capsule Take a 300 mg capsule three times a day for two weeks following surgery.Then take a 300 mg capsule two times a day for two weeks. Then take a 300  mg capsule once a day for two weeks. Then discontinue. 08/04/20   Edmisten, Ok Anis, PA  ?hydrochlorothiazide (HYDRODIURIL) 25 MG tablet Take 25 mg by mouth daily.    [provider]  ?HYDROmorphone (DILAUDID) 2 MG tablet Take 1-2 tablets (2-4 mg total) by mouth every 6 (six) hours as needed for severe pain or moderate pain (2mg  for moderate pain, 4mg  for severe pain). 08/04/20   Edmisten, Ok Anis, PA  ?Magnesium 250 MG TABS Take 250 mg by mouth daily.    [provider]  ?methocarbamol (ROBAXIN) 500 MG tablet Take 1 tablet (500 mg total) by mouth every 6 (six) hours as needed for muscle spasms. 08/04/20   Edmisten, Ok Anis, PA  ?montelukast (SINGULAIR) 10 MG tablet Take 10 mg by mouth at bedtime.    [provider]  ?phentermine 37.5 MG capsule Take 37.5 mg by mouth every morning. Last dose 07-26-20    [provider]  ?   ? ?Allergies    ?Levaquin [levofloxacin]   ? ?Review of Systems   ?Review of Systems  ?Musculoskeletal:  Positive for arthralgias.  ?All other systems reviewed and are negative. ? ?Physical Exam ?Updated Vital Signs ?BP (!) 142/80   Pulse 85   Resp 20   Ht 5\' 2"  (1.575 m)   SpO2 100%   BMI 39.03 kg/m?  ?Physical Exam ?Vitals and nursing note reviewed.  ?Constitutional:   ?  General: She is not in acute distress. ?   Appearance: She is well-developed.  ?HENT:  ?   Head: Normocephalic and atraumatic.  ?Eyes:  ?   Conjunctiva/sclera: Conjunctivae normal.  ?Cardiovascular:  ?   Rate and Rhythm: Normal rate and regular rhythm.  ?   Heart sounds: No murmur heard. ?Pulmonary:  ?   Effort: Pulmonary effort is normal. No respiratory distress.  ?Musculoskeletal:     ?   General: No swelling.  ?   Cervical back: Neck supple.  ?   Comments: Right upper extremity: There is approximately 3 cm laceration to the dorsal aspect of the right hand at base of the index finger, normal ROM, normal flexion and extension of all digits, normal sensation and cap refill distal to  the laceration on the index finger  ?Skin: ?   General: Skin is warm and dry.  ?   Capillary Refill: Capillary refill takes less than 2 seconds.  ?Neurological:  ?   General: No focal deficit present.  ?   Mental Status: She is alert.  ?Psychiatric:     ?   Mood and Affect: Mood normal.  ? ? ?ED Results / Procedures / Treatments   ?Labs ?(all labs ordered are listed, but only abnormal results are displayed) ?Labs Reviewed - No data to display ? ?EKG ?None ? ?Radiology ?DG Finger Index Right ? ?Result Date: 05/06/2021 ?CLINICAL DATA:  Posterior right hand cut at the second MCP on the lateral side with an electrical chain saw. EXAM: RIGHT INDEX FINGER 2+V COMPARISON:  None. FINDINGS: There is no evidence of fracture or dislocation. Radiopaque foreign body. Soft tissue swelling about the index finger. IMPRESSION: No evidence of fracture or dislocation.  No radiopaque foreign body. Electronically Signed   By: Keane Police D.O.   On: 05/06/2021 15:21   ? ?Procedures ?Marland Kitchen.Laceration Repair ? ?Date/Time: 05/06/2021 4:40 PM ?Performed by: Lucrezia Starch, MD ?Authorized by: Lucrezia Starch, MD  ? ?Consent:  ?  Consent obtained:  Verbal ?  Consent given by:  Patient ?  Risks discussed:  Infection, need for additional repair, poor wound healing, nerve damage, poor cosmetic result, pain, retained foreign body, tendon damage and vascular damage ?  Alternatives discussed:  No treatment ?Universal protocol:  ?  Immediately prior to procedure, a time out was called: yes   ?  Patient identity confirmed:  Verbally with patient ?Anesthesia:  ?  Anesthesia method:  Local infiltration ?  Local anesthetic:  Lidocaine 1% w/o epi ?Laceration details:  ?  Location:  Hand ?  Hand location:  R hand, dorsum ?  Length (cm):  3 ?Treatment:  ?  Area cleansed with:  Povidone-iodine and saline ?  Amount of cleaning:  Extensive ?  Irrigation solution:  Sterile saline ?  Irrigation method:  Syringe ?  Debridement:  None ?Skin repair:  ?  Repair  method:  Sutures ?  Suture size:  5-0 ?  Suture material:  Nylon ?  Suture technique:  Simple interrupted ?  Number of sutures:  4 ?Approximation:  ?  Approximation:  Close ?Repair type:  ?  Repair type:  Intermediate ?Post-procedure details:  ?  Dressing:  Sterile dressing ?  Procedure completion:  Tolerated well, no immediate complications  ? ? ?Medications Ordered in ED ?Medications  ?oxyCODONE-acetaminophen (PERCOCET/ROXICET) 5-325 MG per tablet 1 tablet (1 tablet Oral Given 05/06/21 1443)  ?lidocaine (PF) (XYLOCAINE) 1 % injection 10 mL (10 mLs Infiltration Given 05/06/21 1539)  ? ? ?  ED Course/ Medical Decision Making/ A&P ?  ?                        ?Medical Decision Making ?Amount and/or Complexity of Data Reviewed ?Radiology: ordered. ? ?Risk ?Prescription drug management. ? ? ?66 year old who presented to ER with concern for right hand injury after laceration from chainsaw.  She has a approximately 3 cm laceration to the dorsal aspect of her right hand.  Thankfully she has normal flexion and extension of her index finger and sensation and cap refill is intact throughout the entirety of her index finger.  Performed thorough irrigation with copious amounts of sterile saline as well as diluted Betadine solution.  Performed laceration repair, close approximation.  Pneumostasis achieved.  Advise follow-up with either her Ortho clinic or PCP in 10 to 14 days for wound recheck and suture removal.  Reviewed return precautions and wound care with patient as well as her wife at bedside. ? ?I independently reviewed her x-ray, no acute findings noted. ? ?Tetanus up-to-date. ? ?After the discussed management above, the patient was determined to be safe for discharge.  The patient was in agreement with this plan and all questions regarding their care were answered.  ED return precautions were discussed and the patient will return to the ED with any significant worsening of condition. ? ? ? ? ? ? ? ? ?Final Clinical  Impression(s) / ED Diagnoses ?Final diagnoses:  ?Laceration of right hand, foreign body presence unspecified, initial encounter  ? ? ?Rx / DC Orders ?ED Discharge Orders   ? ? None  ? ?  ? ? ?  ?Jeanette Rodriguez

## 2021-05-06 NOTE — ED Notes (Signed)
Pt states she was using an electric chainsaw and cut her proximal rt. Index finger, dsg in place bleeding controlled.  Pt is anxious and reports pain 10/10.  ?Triage emergency protocol for pain placed.  Significant other at bedside ?

## 2021-05-24 ENCOUNTER — Other Ambulatory Visit (HOSPITAL_BASED_OUTPATIENT_CLINIC_OR_DEPARTMENT_OTHER): Payer: Self-pay

## 2021-06-18 ENCOUNTER — Encounter (HOSPITAL_BASED_OUTPATIENT_CLINIC_OR_DEPARTMENT_OTHER): Payer: Self-pay | Admitting: Physical Therapy

## 2021-06-21 ENCOUNTER — Other Ambulatory Visit (HOSPITAL_BASED_OUTPATIENT_CLINIC_OR_DEPARTMENT_OTHER): Payer: Self-pay

## 2021-06-22 ENCOUNTER — Other Ambulatory Visit (HOSPITAL_BASED_OUTPATIENT_CLINIC_OR_DEPARTMENT_OTHER): Payer: Self-pay

## 2021-06-22 MED ORDER — OZEMPIC (2 MG/DOSE) 8 MG/3ML ~~LOC~~ SOPN
PEN_INJECTOR | SUBCUTANEOUS | 2 refills | Status: DC
  Filled 2021-06-22: qty 9, 84d supply, fill #0

## 2021-06-22 MED ORDER — GABAPENTIN 100 MG PO CAPS
ORAL_CAPSULE | ORAL | 1 refills | Status: DC
  Filled 2021-06-22: qty 360, 90d supply, fill #0
  Filled 2021-12-16: qty 360, 90d supply, fill #1

## 2021-06-22 MED ORDER — CEPHALEXIN 500 MG PO CAPS
ORAL_CAPSULE | ORAL | 0 refills | Status: DC
  Filled 2021-06-22: qty 20, 10d supply, fill #0

## 2021-06-23 ENCOUNTER — Other Ambulatory Visit (HOSPITAL_BASED_OUTPATIENT_CLINIC_OR_DEPARTMENT_OTHER): Payer: Self-pay

## 2021-06-24 ENCOUNTER — Other Ambulatory Visit (HOSPITAL_BASED_OUTPATIENT_CLINIC_OR_DEPARTMENT_OTHER): Payer: Self-pay

## 2021-06-28 ENCOUNTER — Other Ambulatory Visit (HOSPITAL_BASED_OUTPATIENT_CLINIC_OR_DEPARTMENT_OTHER): Payer: Self-pay

## 2021-06-28 MED ORDER — MELOXICAM 15 MG PO TABS
ORAL_TABLET | ORAL | 4 refills | Status: DC
  Filled 2021-06-28: qty 30, 30d supply, fill #0

## 2021-06-30 ENCOUNTER — Other Ambulatory Visit (HOSPITAL_BASED_OUTPATIENT_CLINIC_OR_DEPARTMENT_OTHER): Payer: Self-pay

## 2021-06-30 ENCOUNTER — Ambulatory Visit: Payer: Medicare Other | Attending: Internal Medicine

## 2021-06-30 DIAGNOSIS — Z23 Encounter for immunization: Secondary | ICD-10-CM

## 2021-06-30 MED ORDER — AZELASTINE HCL 0.1 % NA SOLN
NASAL | 3 refills | Status: DC
  Filled 2021-06-30: qty 60, 90d supply, fill #0

## 2021-06-30 MED ORDER — PFIZER COVID-19 VAC BIVALENT 30 MCG/0.3ML IM SUSP
INTRAMUSCULAR | 0 refills | Status: DC
Start: 2021-06-30 — End: 2021-08-03
  Filled 2021-06-30: qty 0.3, 1d supply, fill #0

## 2021-07-01 ENCOUNTER — Other Ambulatory Visit (HOSPITAL_BASED_OUTPATIENT_CLINIC_OR_DEPARTMENT_OTHER): Payer: Self-pay

## 2021-07-01 MED ORDER — OXYCODONE-ACETAMINOPHEN 10-325 MG PO TABS
1.0000 | ORAL_TABLET | Freq: Three times a day (TID) | ORAL | 0 refills | Status: DC | PRN
  Filled 2021-07-01: qty 60, 20d supply, fill #0
  Filled 2021-07-01: qty 30, 10d supply, fill #0

## 2021-07-13 NOTE — Progress Notes (Addendum)
Anesthesia Review:  PCP: Ramona hayes,NP - saw for clearance on 07/20/21 have reuqested clearance ov note.  OV 07/20/21 on chart with clearance  Cardiologist : none  Chest x-ray : EKG : 07/20/21 - have requested from PCP .  LVMMM.   Received EKG from 07/20/21 on 07/28/21 and placed on chart.  Echo : Stress test: Cardiac Cath :  Activity level: can do a flight of stairs without difficulty  Sleep Study/ CPAP : none  Fasting Blood Sugar :      / Checks Blood Sugar -- times a day:   Blood Thinner/ Instructions /Last Dose: ASA / Instructions/ Last Dose :   On Ozempic for weight loss per pt not diabetic  PT teary eyed and anxious the whole preop visit.  PT states " I get very anxious over surgeries".   Only complaint at preop was some vertigo related to recent sinus infection.  Seen by PCP on5/30/2023.  Was prescribed some Valium prn for anxiety.  Instructed pt she can have valium if needed am of surgery or pm before surgery not at both times.  PT voiced understanding.   PT to see DR Aluisio for proep on 07/22/21.  Instructed pt to inform DR Aluisio of recent sinus infection.  PT voiced understanding.  Have requested ekg tracing, ov note and labs done 07/20/2021 from Chari Manning at [facility].  They are to fax.  CBC /DIFF , CMP Urine DIP , PT and Lipid Panel done 07/20/21 on chart  Recalled office on 07/22/2021 and rerequested 12 lead ekg tracing.   07/23/21 rerequested 12 lead ekg tracing for 3rd time. LVMM 07/26/2021 12 lead ekg report was sent not the tracin fronm [facility].  Have requested 12 lead ekg tracing again from [facility] [place].

## 2021-07-13 NOTE — Progress Notes (Signed)
DUE TO COVID-19 ONLY ONE VISITOR IS ALLOWED TO COME WITH YOU AND STAY IN THE WAITING ROOM ONLY DURING PRE OP AND PROCEDURE DAY OF SURGERY.  2 VISITOR  MAY VISIT WITH YOU AFTER SURGERY IN YOUR PRIVATE ROOM DURING VISITING HOURS ONLY! YOU MAY HAVE ONE PERSON SPEND THE NITE WITH YOU IN YOUR ROOM AFTER SURGERY.     Your procedure is scheduled on:         08/02/2021   Report to [facility] Main  Entrance   Report to admitting at         0630am          AM DO NOT BRING INSURANCE CARD, PICTURE ID OR WALLET DAY OF SURGERY.      Call this number if you have problems the morning of surgery [phone]    REMEMBER: NO  SOLID FOODS , CANDY, GUM OR MINTS AFTER MIDNITE THE NITE BEFORE SURGERY .       Marland Kitchen CLEAR LIQUIDS UNTIL    0615am             DAY OF SURGERY.      PLEASE FINISH g2 lower sugar ( no red)  DRINK PER SURGEON ORDER  WHICH NEEDS TO BE COMPLETED AT   0615am         MORNING OF SURGERY.       CLEAR LIQUID DIET   Foods Allowed      WATER BLACK COFFEE ( SUGAR OK, NO MILK, CREAM OR CREAMER) REGULAR AND DECAF  TEA ( SUGAR OK NO MILK, CREAM, OR CREAMER) REGULAR AND DECAF  PLAIN JELLO ( NO RED)  FRUIT ICES ( NO RED, NO FRUIT PULP)  POPSICLES ( NO RED)  JUICE- APPLE, WHITE GRAPE AND WHITE CRANBERRY  SPORT DRINK LIKE GATORADE ( NO RED)  CLEAR BROTH ( VEGETABLE , CHICKEN OR BEEF)                                                                     BRUSH YOUR TEETH MORNING OF SURGERY AND RINSE YOUR MOUTH OUT, NO CHEWING GUM CANDY OR MINTS.     Take these medicines the morning of surgery with A SIP OF WATER:  gabapentin    DO NOT TAKE ANY DIABETIC MEDICATIONS DAY OF YOUR SURGERY                               You may not have any metal on your body including hair pins and              piercings  Do not wear jewelry, make-up, lotions, powders or perfumes, deodorant             Do not wear nail polish on your fingernails.              IF YOU ARE A FEMALE AND WANT TO SHAVE UNDER ARMS  OR LEGS PRIOR TO SURGERY YOU MUST DO SO AT LEAST 48 HOURS PRIOR TO SURGERY.              Men may shave face and neck.   Do not bring valuables to the hospital. [facility] IS NOT  RESPONSIBLE   FOR VALUABLES.  Contacts, dentures or bridgework may not be worn into surgery.  Leave suitcase in the car. After surgery it may be brought to your room.     Patients discharged the day of surgery will not be allowed to drive home. IF YOU ARE HAVING SURGERY AND GOING HOME THE SAME DAY, YOU MUST HAVE AN ADULT TO DRIVE YOU HOME AND BE WITH YOU FOR 24 HOURS. YOU MAY GO HOME BY TAXI OR UBER OR ORTHERWISE, BUT AN ADULT MUST ACCOMPANY YOU HOME AND STAY WITH YOU FOR 24 HOURS.                Please read over the following fact sheets you were given: _____________________________________________________________________  [facility] - Preparing for Surgery Before surgery, you can play an important role.  Because skin is not sterile, your skin needs to be as free of germs as possible.  You can reduce the number of germs on your skin by washing with CHG (chlorahexidine gluconate) soap before surgery.  CHG is an antiseptic cleaner which kills germs and bonds with the skin to continue killing germs even after washing. Please DO NOT use if you have an allergy to CHG or antibacterial soaps.  If your skin becomes reddened/irritated stop using the CHG and inform your nurse when you arrive at Short Stay. Do not shave (including legs and underarms) for at least 48 hours prior to the first CHG shower.  You may shave your face/neck. Please follow these instructions carefully:  1.  Shower with CHG Soap the night before surgery and the  morning of Surgery.  2.  If you choose to wash your hair, wash your hair first as usual with your  normal  shampoo.  3.  After you shampoo, rinse your hair and body thoroughly to remove the  shampoo.                           4.  Use CHG as you would any other liquid soap.  You can  apply chg directly  to the skin and wash                       Gently with a scrungie or clean washcloth.  5.  Apply the CHG Soap to your body ONLY FROM THE NECK DOWN.   Do not use on face/ open                           Wound or open sores. Avoid contact with eyes, ears mouth and genitals (private parts).                       Wash face,  Genitals (private parts) with your normal soap.             6.  Wash thoroughly, paying special attention to the area where your surgery  will be performed.  7.  Thoroughly rinse your body with warm water from the neck down.  8.  DO NOT shower/wash with your normal soap after using and rinsing off  the CHG Soap.                9.  Pat yourself dry with a clean towel.            10.  Wear clean pajamas.  11.  Place clean sheets on your bed the night of your first shower and do not  sleep with pets. Day of Surgery : Do not apply any lotions/deodorants the morning of surgery.  Please wear clean clothes to the hospital/surgery center.  FAILURE TO FOLLOW THESE INSTRUCTIONS MAY RESULT IN THE CANCELLATION OF YOUR SURGERY PATIENT SIGNATURE_________________________________  NURSE SIGNATURE__________________________________  ________________________________________________________________________

## 2021-07-20 ENCOUNTER — Other Ambulatory Visit (HOSPITAL_BASED_OUTPATIENT_CLINIC_OR_DEPARTMENT_OTHER): Payer: Self-pay

## 2021-07-21 ENCOUNTER — Other Ambulatory Visit (HOSPITAL_BASED_OUTPATIENT_CLINIC_OR_DEPARTMENT_OTHER): Payer: Self-pay

## 2021-07-21 ENCOUNTER — Other Ambulatory Visit: Payer: Self-pay

## 2021-07-21 ENCOUNTER — Encounter (HOSPITAL_COMMUNITY)
Admission: RE | Admit: 2021-07-21 | Discharge: 2021-07-21 | Disposition: A | Discharge status: Home / Self Care | Payer: Medicare Other | Source: Ambulatory Visit | Attending: Orthopedic Surgery | Admitting: Orthopedic Surgery

## 2021-07-21 ENCOUNTER — Encounter (HOSPITAL_COMMUNITY): Payer: Self-pay

## 2021-07-21 VITALS — BP 141/95 | HR 69 | Temp 98.4°F | Resp 16 | Ht 67.0 in | Wt 168.0 lb

## 2021-07-21 DIAGNOSIS — Z01818 Encounter for other preprocedural examination: Secondary | ICD-10-CM

## 2021-07-21 DIAGNOSIS — Z01812 Encounter for preprocedural laboratory examination: Secondary | ICD-10-CM | POA: Insufficient documentation

## 2021-07-21 DIAGNOSIS — E139 Other specified diabetes mellitus without complications: Secondary | ICD-10-CM

## 2021-07-21 HISTORY — DX: Depression, unspecified: F32.A

## 2021-07-21 HISTORY — DX: Fibromyalgia: M79.7

## 2021-07-21 LAB — SURGICAL PCR SCREEN
MRSA, PCR: NEGATIVE
Staphylococcus aureus: NEGATIVE

## 2021-07-21 MED ORDER — DIAZEPAM 10 MG PO TABS
ORAL_TABLET | ORAL | 0 refills | Status: DC
  Filled 2021-07-21: qty 3, 3d supply, fill #0

## 2021-07-21 MED ORDER — MELOXICAM 15 MG PO TABS
15.0000 mg | ORAL_TABLET | Freq: Every day | ORAL | 4 refills | Status: DC
  Filled 2021-07-21: qty 30, 30d supply, fill #0

## 2021-07-21 MED ORDER — FLUTICASONE PROPIONATE 50 MCG/ACT NA SUSP
NASAL | 0 refills | Status: DC
  Filled 2021-07-21: qty 16, 60d supply, fill #0

## 2021-07-21 MED ORDER — CETIRIZINE HCL 10 MG PO TABS
ORAL_TABLET | ORAL | 0 refills | Status: DC
  Filled 2021-07-21: qty 100, 100d supply, fill #0
  Filled 2021-07-21: qty 90, 90d supply, fill #0

## 2021-07-21 MED ORDER — MECLIZINE HCL 25 MG PO TABS
ORAL_TABLET | ORAL | 0 refills | Status: DC
  Filled 2021-07-21: qty 15, 8d supply, fill #0

## 2021-07-21 MED ORDER — OZEMPIC (2 MG/DOSE) 8 MG/3ML ~~LOC~~ SOPN
PEN_INJECTOR | SUBCUTANEOUS | 3 refills | Status: DC
  Filled 2021-07-21 – 2021-08-26 (×2): qty 9, 84d supply, fill #0
  Filled 2021-11-19: qty 9, 84d supply, fill #1
  Filled 2022-02-09: qty 9, 84d supply, fill #2

## 2021-07-22 ENCOUNTER — Other Ambulatory Visit (HOSPITAL_BASED_OUTPATIENT_CLINIC_OR_DEPARTMENT_OTHER): Payer: Self-pay

## 2021-07-22 NOTE — H&P (Signed)
TOTAL KNEE ADMISSION H&P  Patient is being admitted for left total knee arthroplasty.  Subjective:  Chief Complaint: Left knee pain.  HPI: Jeanette Rodriguez, 66 y.o. female has a history of pain and functional disability in the left knee due to arthritis and has failed non-surgical conservative treatments for greater than 12 weeks to include NSAID's and/or analgesics, corticosteriod injections, and activity modification. Onset of symptoms was gradual, starting several years ago with gradually worsening course since that time. The patient noted no past surgery on the left knee.  Patient currently rates pain in the left knee at 8 out of 10 with activity. Patient has night pain, worsening of pain with activity and weight bearing, and pain that interferes with activities of daily living. Patient has evidence of  bone-on-bone arthritis in the medial and patellofemoral compartments with varus deformity  by imaging studies. There is no active infection.  Patient Active Problem List   Diagnosis Date Noted   Primary osteoarthritis of knee 08/04/2020   OA (osteoarthritis) of knee 08/03/2020    Past Medical History:  Diagnosis Date   Anxiety    Arthritis    Asthma    Depression    Fibromyalgia    GERD (gastroesophageal reflux disease)    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 [place]   Hypertension    Pneumonia     Past Surgical History:  Procedure Laterality Date   ANKLE SURGERY     x3 right   2007, 2009,2011   TONSILLECTOMY     TOTAL KNEE ARTHROPLASTY Right 08/03/2020   Procedure: TOTAL KNEE ARTHROPLASTY;  Surgeon: Gaynelle Arabian, MD;  Location: [facility];  Service: Orthopedics;  Laterality: Right;  59min    Prior to Admission medications   Medication Sig Start Date End Date Taking? Authorizing Provider  gabapentin (NEURONTIN) 100 MG capsule 1 capsule Orally four times a day 90 days Patient taking differently: Take 100 mg by mouth 3 (three) times daily. 06/22/21  Yes    hydrochlorothiazide (HYDRODIURIL) 25 MG tablet Take 25 mg by mouth daily.   Yes [provider]  Magnesium 500 MG TABS Take 500 mg by mouth daily.   Yes [provider]  meloxicam (MOBIC) 15 MG tablet Take 1 tablet (15 mg) by mouth once daily. 04/13/21  Yes Berle Mull, MD  montelukast (SINGULAIR) 10 MG tablet Take 10 mg by mouth at bedtime.   Yes [provider]  oxyCODONE-acetaminophen (PERCOCET) 10-325 MG tablet Take 1 tablet by mouth three times a day as needed 06/30/21  Yes   cephALEXin (KEFLEX) 500 MG capsule 1 capsule Orally twice a day 10 days Patient not taking: Reported on 07/13/2021 06/22/21     cetirizine (ZYRTEC) 10 MG tablet 1 tablet Orally Once a day 90 days 07/21/21     COVID-19 mRNA bivalent vaccine, Pfizer, (PFIZER COVID-19 VAC BIVALENT) injection Inject into the muscle. 06/30/21   Carlyle Basques, MD  diazepam (VALIUM) 10 MG tablet 1 tablet as needed before surgery Orally Once a day 3 days 07/21/21     fluticasone (FLONASE) 50 MCG/ACT nasal spray 1 spray in each nostril Nasally Once a day 30 day(s) 07/21/21     meclizine (ANTIVERT) 25 MG tablet 1 tablet as needed Orally every 12 hrs as needed 30 days 07/21/21     meloxicam (MOBIC) 15 MG tablet Take 1 tablet by mouth once a day 07/21/21     Semaglutide, 2 MG/DOSE, (OZEMPIC, 2 MG/DOSE,) 8 MG/3ML SOPN 2MG  Subcutaneous  Once a week 90 days 07/21/21       Allergies  Allergen Reactions   Levaquin [Levofloxacin]     Leg swelling    Social History   Socioeconomic History   Marital status: Married    Spouse name: Not on file   Number of children: Not on file   Years of education: Not on file   Highest education level: Not on file  Occupational History   Not on file  Tobacco Use   Smoking status: Former    Years: 10.00    Types: Cigarettes   Smokeless tobacco: Never   Tobacco comments:    quit 2015  Vaping Use   Vaping Use: Never used  Substance and Sexual Activity   Alcohol use: No    Comment:  rarely   Drug use: Never   Sexual activity: Not Currently  Other Topics Concern   Not on file  Social History Narrative   Not on file   Social Determinants of Health   Financial Resource Strain: Not on file  Food Insecurity: Not on file  Transportation Needs: Not on file  Physical Activity: Not on file  Stress: Not on file  Social Connections: Not on file  Intimate Partner Violence: Not on file    Tobacco Use: Medium Risk   Smoking Tobacco Use: Former   Smokeless Tobacco Use: Never   Passive Exposure: Not on file   Social History   Substance and Sexual Activity  Alcohol Use No   Comment: rarely    No family history on file.  Review of Systems  Constitutional:  Negative for chills and fever.  HENT: Negative.    Eyes: Negative.   Respiratory:  Negative for cough and shortness of breath.   Cardiovascular:  Negative for chest pain and palpitations.  Gastrointestinal:  Negative for abdominal pain, constipation, diarrhea, nausea and vomiting.  Genitourinary:  Negative for dysuria, frequency and urgency.  Musculoskeletal:  Positive for joint pain.  Skin:  Negative for rash.   Objective:  Physical Exam: Well nourished and well developed.  General: Alert and oriented x3, cooperative and pleasant, no acute distress.  Head: normocephalic, atraumatic, neck supple.  Eyes: EOMI.  Abdomen: non-tender to palpation and soft, normoactive bowel sounds. Musculoskeletal: Right Knee Exam:  Looks fantastic.  No effusion present. No swelling present.  Range of motion: 0 to 120 degrees.  No crepitus on range of motion of the knee.  No medial joint line tenderness.  No lateral joint line tenderness.  The knee is stable.   Left Knee Exam:  Slight varus deformity.  No effusion present. No swelling present.  Range of motion: 5 to 120 degrees.  Moderate crepitus on range of motion of the knee.  Significant medial joint line tenderness.  No lateral joint line tenderness.  The  knee is stable.  Calves soft and nontender. Motor function intact in LE. Strength 5/5 LE bilaterally. Neuro: Distal pulses 2+. Sensation to light touch intact in LE.  Vital signs in last 24 hours:    Imaging Review Plain radiographs demonstrate moderate degenerative joint disease of the left knee. The overall alignment is mild varus. The bone quality appears to be adequate for age and reported activity level.  Assessment/Plan:  End stage arthritis, left knee   The patient history, physical examination, clinical judgment of the provider and imaging studies are consistent with end stage degenerative joint disease of the left knee and total knee arthroplasty is deemed medically necessary. The treatment options including  medical management, injection therapy arthroscopy and arthroplasty were discussed at length. The risks and benefits of total knee arthroplasty were presented and reviewed. The risks due to aseptic loosening, infection, stiffness, patella tracking problems, thromboembolic complications and other imponderables were discussed. The patient acknowledged the explanation, agreed to proceed with the plan and consent was signed. Patient is being admitted for inpatient treatment for surgery, pain control, PT, OT, prophylactic antibiotics, VTE prophylaxis, progressive ambulation and ADLs and discharge planning. The patient is planning to be discharged  home .  Patient's anticipated LOS is less than 2 midnights, meeting these requirements: - Lives within 1 hour of care - Has a competent adult at home to recover with post-op  - NO history of  - Chronic pain requiring opiods  - Diabetes  - Coronary Artery Disease  - Heart failure  - Heart attack  - Stroke  - DVT/VTE  - Cardiac arrhythmia  - Respiratory Failure/COPD  - Renal failure  - Anemia  - Advanced Liver disease  Therapy Plans: [place] Disposition: Home with Spouse Planned DVT Prophylaxis: Aspirin 325 mg  BID DME Needed: None PCP: Windy Carina, NP (clearance received) TXA: IV Allergies: levofloxacin (leg swelling) Anesthesia Concerns: None BMI: 33.5 Last HgbA1c: n/a  Pharmacy: [facility]  Other: -MRSA + in 2015 -Significant pain immediately upon awakening from anesthesia  - Patient was instructed on what medications to stop prior to surgery. - Follow-up visit in 2 weeks with Dr. Wynelle Link - Begin physical therapy following surgery - Pre-operative lab work as pre-surgical testing - Prescriptions will be provided in hospital at time of discharge  R. Jaynie Bream, PA-C Orthopedic Surgery [facility] [place]

## 2021-07-26 ENCOUNTER — Encounter (HOSPITAL_BASED_OUTPATIENT_CLINIC_OR_DEPARTMENT_OTHER): Payer: Self-pay

## 2021-07-27 ENCOUNTER — Other Ambulatory Visit (HOSPITAL_BASED_OUTPATIENT_CLINIC_OR_DEPARTMENT_OTHER): Payer: Self-pay

## 2021-07-27 MED ORDER — PREDNISONE 20 MG PO TABS
ORAL_TABLET | ORAL | 0 refills | Status: DC
  Filled 2021-07-27: qty 5, 4d supply, fill #0

## 2021-08-01 NOTE — Anesthesia Preprocedure Evaluation (Signed)
Anesthesia Evaluation  Patient identified by MRN, date of birth, ID band Patient awake    Reviewed: Allergy & Precautions, NPO status , Patient's Chart, lab work & pertinent test results  Airway Mallampati: II  TM Distance: >3 FB Neck ROM: Full    Dental no notable dental hx. (+) Teeth Intact, Dental Advisory Given, Implants   Pulmonary asthma , former smoker,    Pulmonary exam normal breath sounds clear to auscultation       Cardiovascular hypertension, Normal cardiovascular exam Rhythm:Regular Rate:Normal     Neuro/Psych Anxiety    GI/Hepatic GERD  ,  Endo/Other  negative endocrine ROS  Renal/GU negative Renal ROS     Musculoskeletal  (+) Arthritis , Osteoarthritis,  Fibromyalgia -  Abdominal   Peds  Hematology   Anesthesia Other Findings All: Levaquin  Reproductive/Obstetrics                           Anesthesia Physical Anesthesia Plan  ASA: 2  Anesthesia Plan: Spinal and Regional   Post-op Pain Management: Regional block* and Ofirmev IV (intra-op)*   Induction:   PONV Risk Score and Plan: 2 and Treatment may vary due to age or medical condition and Midazolam  Airway Management Planned: Nasal Cannula and Natural Airway  Additional Equipment: None  Intra-op Plan:   Post-operative Plan:   Informed Consent: I have reviewed the patients History and Physical, chart, labs and discussed the procedure including the risks, benefits and alternatives for the proposed anesthesia with the patient or authorized representative who has indicated his/her understanding and acceptance.     Dental advisory given  Plan Discussed with: CRNA  Anesthesia Plan Comments: (Sp w L adductor Canal)       Anesthesia Quick Evaluation

## 2021-08-02 ENCOUNTER — Encounter (HOSPITAL_COMMUNITY): Payer: Self-pay | Admitting: Orthopedic Surgery

## 2021-08-02 ENCOUNTER — Observation Stay (HOSPITAL_COMMUNITY)
Admission: RE | Admit: 2021-08-02 | Discharge: 2021-08-03 | Disposition: A | Discharge status: Home / Self Care | Payer: Medicare Other | Attending: Orthopedic Surgery | Admitting: Orthopedic Surgery

## 2021-08-02 ENCOUNTER — Other Ambulatory Visit: Payer: Self-pay

## 2021-08-02 ENCOUNTER — Ambulatory Visit (HOSPITAL_BASED_OUTPATIENT_CLINIC_OR_DEPARTMENT_OTHER): Payer: Medicare Other | Admitting: Anesthesiology

## 2021-08-02 ENCOUNTER — Other Ambulatory Visit (HOSPITAL_BASED_OUTPATIENT_CLINIC_OR_DEPARTMENT_OTHER): Payer: Self-pay

## 2021-08-02 ENCOUNTER — Ambulatory Visit (HOSPITAL_COMMUNITY): Payer: Medicare Other | Admitting: Physician Assistant

## 2021-08-02 ENCOUNTER — Encounter (HOSPITAL_COMMUNITY)
Admission: RE | Disposition: A | Discharge status: Home / Self Care | Payer: Self-pay | Source: Home / Self Care | Attending: Orthopedic Surgery

## 2021-08-02 DIAGNOSIS — Z01818 Encounter for other preprocedural examination: Secondary | ICD-10-CM

## 2021-08-02 DIAGNOSIS — I10 Essential (primary) hypertension: Secondary | ICD-10-CM | POA: Insufficient documentation

## 2021-08-02 DIAGNOSIS — Z87891 Personal history of nicotine dependence: Secondary | ICD-10-CM | POA: Insufficient documentation

## 2021-08-02 DIAGNOSIS — J45909 Unspecified asthma, uncomplicated: Secondary | ICD-10-CM | POA: Insufficient documentation

## 2021-08-02 DIAGNOSIS — M1712 Unilateral primary osteoarthritis, left knee: Principal | ICD-10-CM | POA: Insufficient documentation

## 2021-08-02 DIAGNOSIS — Z96651 Presence of right artificial knee joint: Secondary | ICD-10-CM | POA: Diagnosis not present

## 2021-08-02 DIAGNOSIS — Z79899 Other long term (current) drug therapy: Secondary | ICD-10-CM | POA: Diagnosis not present

## 2021-08-02 DIAGNOSIS — M1710 Unilateral primary osteoarthritis, unspecified knee: Secondary | ICD-10-CM

## 2021-08-02 HISTORY — PX: TOTAL KNEE ARTHROPLASTY: SHX125

## 2021-08-02 SURGERY — ARTHROPLASTY, KNEE, TOTAL
Anesthesia: Regional | Site: Knee | Laterality: Left

## 2021-08-02 MED ORDER — FENTANYL CITRATE PF 50 MCG/ML IJ SOSY
50.0000 ug | PREFILLED_SYRINGE | INTRAMUSCULAR | Status: DC
  Administered 2021-08-02: 100 ug via INTRAVENOUS
  Filled 2021-08-02: qty 2

## 2021-08-02 MED ORDER — SODIUM CHLORIDE (PF) 0.9 % IJ SOLN
INTRAMUSCULAR | Status: AC
  Filled 2021-08-02: qty 50

## 2021-08-02 MED ORDER — CEFAZOLIN SODIUM-DEXTROSE 2-4 GM/100ML-% IV SOLN
2.0000 g | INTRAVENOUS | Status: AC
  Administered 2021-08-02: 2 g via INTRAVENOUS
  Filled 2021-08-02: qty 100

## 2021-08-02 MED ORDER — POVIDONE-IODINE 10 % EX SWAB
2.0000 "application " | Freq: Once | CUTANEOUS | Status: AC
  Administered 2021-08-02: 2 via TOPICAL

## 2021-08-02 MED ORDER — ONDANSETRON HCL 4 MG/2ML IJ SOLN
INTRAMUSCULAR | Status: AC
  Filled 2021-08-02: qty 2

## 2021-08-02 MED ORDER — PROPOFOL 500 MG/50ML IV EMUL
INTRAVENOUS | Status: AC
  Filled 2021-08-02: qty 50

## 2021-08-02 MED ORDER — MENTHOL 3 MG MT LOZG: 1.0000 | LOZENGE | OROMUCOSAL | Status: DC | PRN

## 2021-08-02 MED ORDER — METHOCARBAMOL 500 MG IVPB - SIMPLE MED: 500.0000 mg | Freq: Four times a day (QID) | INTRAVENOUS | Status: DC | PRN

## 2021-08-02 MED ORDER — ORAL CARE MOUTH RINSE: 15.0000 mL | Freq: Once | OROMUCOSAL | Status: AC

## 2021-08-02 MED ORDER — HYDROMORPHONE HCL 1 MG/ML IJ SOLN: 0.2500 mg | INTRAMUSCULAR | Status: DC | PRN

## 2021-08-02 MED ORDER — BISACODYL 10 MG RE SUPP: 10.0000 mg | Freq: Every day | RECTAL | Status: DC | PRN

## 2021-08-02 MED ORDER — SODIUM CHLORIDE 0.9 % IV SOLN: INTRAVENOUS | Status: DC

## 2021-08-02 MED ORDER — PROPOFOL 500 MG/50ML IV EMUL
INTRAVENOUS | Status: DC | PRN
  Administered 2021-08-02: 50 ug/kg/min via INTRAVENOUS

## 2021-08-02 MED ORDER — LORATADINE 10 MG PO TABS
10.0000 mg | ORAL_TABLET | Freq: Every day | ORAL | Status: DC
  Administered 2021-08-03: 10 mg via ORAL
  Filled 2021-08-02: qty 1

## 2021-08-02 MED ORDER — HYDROCHLOROTHIAZIDE 25 MG PO TABS
25.0000 mg | ORAL_TABLET | Freq: Every day | ORAL | Status: DC
  Administered 2021-08-03: 25 mg via ORAL
  Filled 2021-08-02: qty 1

## 2021-08-02 MED ORDER — FLEET ENEMA 7-19 GM/118ML RE ENEM: 1.0000 | ENEMA | Freq: Once | RECTAL | Status: DC | PRN

## 2021-08-02 MED ORDER — 0.9 % SODIUM CHLORIDE (POUR BTL) OPTIME
TOPICAL | Status: DC | PRN
  Administered 2021-08-02: 1000 mL

## 2021-08-02 MED ORDER — ACETAMINOPHEN 10 MG/ML IV SOLN: 1000.0000 mg | Freq: Once | INTRAVENOUS | Status: DC | PRN

## 2021-08-02 MED ORDER — OXYCODONE HCL 5 MG PO TABS
5.0000 mg | ORAL_TABLET | ORAL | Status: DC | PRN
  Administered 2021-08-02 – 2021-08-03 (×4): 10 mg via ORAL
  Filled 2021-08-02 (×4): qty 2

## 2021-08-02 MED ORDER — OXYCODONE HCL 5 MG/5ML PO SOLN: 5.0000 mg | Freq: Once | ORAL | Status: DC | PRN

## 2021-08-02 MED ORDER — LACTATED RINGERS IV SOLN: INTRAVENOUS | Status: DC

## 2021-08-02 MED ORDER — BUPIVACAINE LIPOSOME 1.3 % IJ SUSP
INTRAMUSCULAR | Status: AC
  Filled 2021-08-02: qty 20

## 2021-08-02 MED ORDER — DEXAMETHASONE SODIUM PHOSPHATE 10 MG/ML IJ SOLN
10.0000 mg | Freq: Once | INTRAMUSCULAR | Status: AC
  Administered 2021-08-03: 10 mg via INTRAVENOUS
  Filled 2021-08-02: qty 1

## 2021-08-02 MED ORDER — DEXAMETHASONE SODIUM PHOSPHATE 10 MG/ML IJ SOLN
INTRAMUSCULAR | Status: AC
  Filled 2021-08-02: qty 1

## 2021-08-02 MED ORDER — BUPIVACAINE LIPOSOME 1.3 % IJ SUSP: 20.0000 mL | Freq: Once | INTRAMUSCULAR | Status: DC

## 2021-08-02 MED ORDER — SODIUM CHLORIDE (PF) 0.9 % IJ SOLN
INTRAMUSCULAR | Status: AC
  Filled 2021-08-02: qty 10

## 2021-08-02 MED ORDER — MORPHINE SULFATE (PF) 2 MG/ML IV SOLN
1.0000 mg | INTRAVENOUS | Status: DC | PRN
  Administered 2021-08-02: 2 mg via INTRAVENOUS
  Filled 2021-08-02: qty 1

## 2021-08-02 MED ORDER — BUPIVACAINE LIPOSOME 1.3 % IJ SUSP
INTRAMUSCULAR | Status: DC | PRN
  Administered 2021-08-02: 20 mL

## 2021-08-02 MED ORDER — FLUTICASONE PROPIONATE 50 MCG/ACT NA SUSP
1.0000 | Freq: Every day | NASAL | Status: DC
  Filled 2021-08-02: qty 16

## 2021-08-02 MED ORDER — ONDANSETRON HCL 4 MG PO TABS: 4.0000 mg | ORAL_TABLET | Freq: Four times a day (QID) | ORAL | Status: DC | PRN

## 2021-08-02 MED ORDER — FENTANYL CITRATE (PF) 100 MCG/2ML IJ SOLN
INTRAMUSCULAR | Status: AC
  Filled 2021-08-02: qty 2

## 2021-08-02 MED ORDER — AMISULPRIDE (ANTIEMETIC) 5 MG/2ML IV SOLN: 10.0000 mg | Freq: Once | INTRAVENOUS | Status: DC | PRN

## 2021-08-02 MED ORDER — ASPIRIN 325 MG PO TBEC
325.0000 mg | DELAYED_RELEASE_TABLET | Freq: Two times a day (BID) | ORAL | Status: DC
  Administered 2021-08-03: 325 mg via ORAL
  Filled 2021-08-02: qty 1

## 2021-08-02 MED ORDER — SODIUM CHLORIDE 0.9 % IR SOLN
Status: DC | PRN
  Administered 2021-08-02: 1000 mL

## 2021-08-02 MED ORDER — PROPOFOL 1000 MG/100ML IV EMUL
INTRAVENOUS | Status: AC
  Filled 2021-08-02: qty 100

## 2021-08-02 MED ORDER — ACETAMINOPHEN 10 MG/ML IV SOLN
1000.0000 mg | Freq: Four times a day (QID) | INTRAVENOUS | Status: DC
  Administered 2021-08-02: 1000 mg via INTRAVENOUS
  Filled 2021-08-02: qty 100

## 2021-08-02 MED ORDER — ONDANSETRON HCL 4 MG/2ML IJ SOLN: 4.0000 mg | Freq: Once | INTRAMUSCULAR | Status: DC | PRN

## 2021-08-02 MED ORDER — GABAPENTIN 300 MG PO CAPS
300.0000 mg | ORAL_CAPSULE | Freq: Three times a day (TID) | ORAL | Status: DC
  Administered 2021-08-02 – 2021-08-03 (×4): 300 mg via ORAL
  Filled 2021-08-02 (×4): qty 1

## 2021-08-02 MED ORDER — DEXAMETHASONE SODIUM PHOSPHATE 10 MG/ML IJ SOLN
8.0000 mg | Freq: Once | INTRAMUSCULAR | Status: AC
  Administered 2021-08-02: 10 mg via INTRAVENOUS

## 2021-08-02 MED ORDER — ONDANSETRON HCL 4 MG/2ML IJ SOLN: 4.0000 mg | Freq: Four times a day (QID) | INTRAMUSCULAR | Status: DC | PRN

## 2021-08-02 MED ORDER — CHLORHEXIDINE GLUCONATE 0.12 % MT SOLN
15.0000 mL | Freq: Once | OROMUCOSAL | Status: AC
  Administered 2021-08-02: 15 mL via OROMUCOSAL

## 2021-08-02 MED ORDER — POLYETHYLENE GLYCOL 3350 17 G PO PACK: 17.0000 g | PACK | Freq: Every day | ORAL | Status: DC | PRN

## 2021-08-02 MED ORDER — OXYCODONE HCL 5 MG PO TABS: 5.0000 mg | ORAL_TABLET | Freq: Once | ORAL | Status: DC | PRN

## 2021-08-02 MED ORDER — DIPHENHYDRAMINE HCL 12.5 MG/5ML PO ELIX: 12.5000 mg | ORAL_SOLUTION | ORAL | Status: DC | PRN

## 2021-08-02 MED ORDER — PHENOL 1.4 % MT LIQD: 1.0000 | OROMUCOSAL | Status: DC | PRN

## 2021-08-02 MED ORDER — METOCLOPRAMIDE HCL 5 MG/ML IJ SOLN: 5.0000 mg | Freq: Three times a day (TID) | INTRAMUSCULAR | Status: DC | PRN

## 2021-08-02 MED ORDER — METHOCARBAMOL 500 MG PO TABS
500.0000 mg | ORAL_TABLET | Freq: Four times a day (QID) | ORAL | Status: DC | PRN
  Administered 2021-08-02: 500 mg via ORAL
  Filled 2021-08-02: qty 1

## 2021-08-02 MED ORDER — PROPOFOL 10 MG/ML IV BOLUS
INTRAVENOUS | Status: AC
  Filled 2021-08-02: qty 20

## 2021-08-02 MED ORDER — ACETAMINOPHEN 500 MG PO TABS
1000.0000 mg | ORAL_TABLET | Freq: Four times a day (QID) | ORAL | Status: AC
  Administered 2021-08-02 – 2021-08-03 (×4): 1000 mg via ORAL
  Filled 2021-08-02 (×4): qty 2

## 2021-08-02 MED ORDER — SODIUM CHLORIDE (PF) 0.9 % IJ SOLN
INTRAMUSCULAR | Status: DC | PRN
  Administered 2021-08-02: 60 mL

## 2021-08-02 MED ORDER — OXYCODONE-ACETAMINOPHEN 10-325 MG PO TABS
1.0000 | ORAL_TABLET | Freq: Three times a day (TID) | ORAL | 0 refills | Status: DC | PRN
  Filled 2021-08-02: qty 90, 30d supply, fill #0

## 2021-08-02 MED ORDER — MIDAZOLAM HCL 2 MG/2ML IJ SOLN
INTRAMUSCULAR | Status: AC
  Filled 2021-08-02: qty 2

## 2021-08-02 MED ORDER — CEFAZOLIN SODIUM-DEXTROSE 2-4 GM/100ML-% IV SOLN
2.0000 g | Freq: Four times a day (QID) | INTRAVENOUS | Status: AC
  Administered 2021-08-02 (×2): 2 g via INTRAVENOUS
  Filled 2021-08-02 (×2): qty 100

## 2021-08-02 MED ORDER — METOCLOPRAMIDE HCL 5 MG PO TABS: 5.0000 mg | ORAL_TABLET | Freq: Three times a day (TID) | ORAL | Status: DC | PRN

## 2021-08-02 MED ORDER — DOCUSATE SODIUM 100 MG PO CAPS
100.0000 mg | ORAL_CAPSULE | Freq: Two times a day (BID) | ORAL | Status: DC
  Administered 2021-08-02 – 2021-08-03 (×2): 100 mg via ORAL
  Filled 2021-08-02 (×2): qty 1

## 2021-08-02 MED ORDER — STERILE WATER FOR IRRIGATION IR SOLN
Status: DC | PRN
  Administered 2021-08-02: 2000 mL

## 2021-08-02 MED ORDER — BUPIVACAINE IN DEXTROSE 0.75-8.25 % IT SOLN
INTRATHECAL | Status: DC | PRN
  Administered 2021-08-02: 12 mg via INTRATHECAL

## 2021-08-02 MED ORDER — TRAMADOL HCL 50 MG PO TABS
50.0000 mg | ORAL_TABLET | Freq: Four times a day (QID) | ORAL | Status: DC | PRN
  Administered 2021-08-02 – 2021-08-03 (×3): 100 mg via ORAL
  Filled 2021-08-02 (×3): qty 2

## 2021-08-02 MED ORDER — ONDANSETRON HCL 4 MG/2ML IJ SOLN
INTRAMUSCULAR | Status: DC | PRN
  Administered 2021-08-02: 4 mg via INTRAVENOUS

## 2021-08-02 MED ORDER — MIDAZOLAM HCL 2 MG/2ML IJ SOLN
1.0000 mg | INTRAMUSCULAR | Status: DC
  Administered 2021-08-02: 2 mg via INTRAVENOUS
  Filled 2021-08-02: qty 2

## 2021-08-02 MED ORDER — TRANEXAMIC ACID-NACL 1000-0.7 MG/100ML-% IV SOLN
1000.0000 mg | INTRAVENOUS | Status: AC
  Administered 2021-08-02: 1000 mg via INTRAVENOUS
  Filled 2021-08-02: qty 100

## 2021-08-02 SURGICAL SUPPLY — 55 items
ATTUNE PS FEM LT SZ 5 CEM KNEE (Femur) ×1 IMPLANT
ATTUNE PSRP INSR SZ5 8 KNEE (Insert) ×1 IMPLANT
BAG COUNTER SPONGE SURGICOUNT (BAG) ×1 IMPLANT
BAG ZIPLOCK 12X15 (MISCELLANEOUS) ×2 IMPLANT
BASE TIBIAL ROT PLAT SZ 5 KNEE (Knees) IMPLANT
BLADE SAG 18X100X1.27 (BLADE) ×2 IMPLANT
BLADE SAW SGTL 11.0X1.19X90.0M (BLADE) ×2 IMPLANT
BNDG ELASTIC 6X5.8 VLCR STR LF (GAUZE/BANDAGES/DRESSINGS) ×2 IMPLANT
BOWL SMART MIX CTS (DISPOSABLE) ×2 IMPLANT
CEMENT HV SMART SET (Cement) ×4 IMPLANT
COVER SURGICAL LIGHT HANDLE (MISCELLANEOUS) ×2 IMPLANT
CUFF TOURN SGL QUICK 34 (TOURNIQUET CUFF) ×1
CUFF TRNQT CYL 34X4.125X (TOURNIQUET CUFF) ×1 IMPLANT
DRAPE INCISE IOBAN 66X45 STRL (DRAPES) ×2 IMPLANT
DRAPE U-SHAPE 47X51 STRL (DRAPES) ×2 IMPLANT
DRSG AQUACEL AG ADV 3.5X10 (GAUZE/BANDAGES/DRESSINGS) ×2 IMPLANT
DURAPREP 26ML APPLICATOR (WOUND CARE) ×2 IMPLANT
ELECT REM PT RETURN 15FT ADLT (MISCELLANEOUS) ×2 IMPLANT
GLOVE BIO SURGEON STRL SZ 6.5 (GLOVE) ×1 IMPLANT
GLOVE BIO SURGEON STRL SZ7.5 (GLOVE) IMPLANT
GLOVE BIO SURGEON STRL SZ8 (GLOVE) ×2 IMPLANT
GLOVE BIOGEL PI IND STRL 6.5 (GLOVE) IMPLANT
GLOVE BIOGEL PI IND STRL 7.0 (GLOVE) IMPLANT
GLOVE BIOGEL PI IND STRL 8 (GLOVE) ×1 IMPLANT
GLOVE BIOGEL PI INDICATOR 6.5 (GLOVE) ×1
GLOVE BIOGEL PI INDICATOR 7.0 (GLOVE)
GLOVE BIOGEL PI INDICATOR 8 (GLOVE) ×1
GOWN STRL REUS W/ TWL LRG LVL3 (GOWN DISPOSABLE) ×1 IMPLANT
GOWN STRL REUS W/ TWL XL LVL3 (GOWN DISPOSABLE) IMPLANT
GOWN STRL REUS W/TWL LRG LVL3 (GOWN DISPOSABLE) ×1
GOWN STRL REUS W/TWL XL LVL3 (GOWN DISPOSABLE)
HANDPIECE INTERPULSE COAX TIP (DISPOSABLE) ×1
HOLDER FOLEY CATH W/STRAP (MISCELLANEOUS) ×1 IMPLANT
IMMOBILIZER KNEE 20 (SOFTGOODS) ×2
IMMOBILIZER KNEE 20 THIGH 36 (SOFTGOODS) ×1 IMPLANT
KIT TURNOVER KIT A (KITS) ×1 IMPLANT
MANIFOLD NEPTUNE II (INSTRUMENTS) ×2 IMPLANT
NS IRRIG 1000ML POUR BTL (IV SOLUTION) ×2 IMPLANT
PACK TOTAL KNEE CUSTOM (KITS) ×2 IMPLANT
PADDING CAST COTTON 6X4 STRL (CAST SUPPLIES) ×3 IMPLANT
PATELLA MEDIAL ATTUN 35MM KNEE (Knees) ×1 IMPLANT
PROTECTOR NERVE ULNAR (MISCELLANEOUS) ×2 IMPLANT
SET HNDPC FAN SPRY TIP SCT (DISPOSABLE) ×1 IMPLANT
SPIKE FLUID TRANSFER (MISCELLANEOUS) ×2 IMPLANT
STRIP CLOSURE SKIN 1/2X4 (GAUZE/BANDAGES/DRESSINGS) ×4 IMPLANT
SUT MNCRL AB 4-0 PS2 18 (SUTURE) ×2 IMPLANT
SUT STRATAFIX 0 PDS 27 VIOLET (SUTURE) ×2
SUT VIC AB 2-0 CT1 27 (SUTURE) ×3
SUT VIC AB 2-0 CT1 TAPERPNT 27 (SUTURE) ×3 IMPLANT
SUTURE STRATFX 0 PDS 27 VIOLET (SUTURE) ×1 IMPLANT
TIBIAL BASE ROT PLAT SZ 5 KNEE (Knees) ×2 IMPLANT
TRAY FOLEY MTR SLVR 16FR STAT (SET/KITS/TRAYS/PACK) ×2 IMPLANT
TUBE SUCTION HIGH CAP CLEAR NV (SUCTIONS) ×2 IMPLANT
WATER STERILE IRR 1000ML POUR (IV SOLUTION) ×4 IMPLANT
WRAP KNEE MAXI GEL POST OP (GAUZE/BANDAGES/DRESSINGS) ×2 IMPLANT

## 2021-08-02 NOTE — Anesthesia Postprocedure Evaluation (Signed)
Anesthesia Post Note  Patient: Jeanette Rodriguez  Procedure(s) Performed: TOTAL KNEE ARTHROPLASTY (Left: Knee)     Patient location during evaluation: Nursing Unit Anesthesia Type: Regional and Spinal Level of consciousness: oriented and awake and alert Pain management: pain level controlled Vital Signs Assessment: post-procedure vital signs reviewed and stable Respiratory status: spontaneous breathing and respiratory function stable Cardiovascular status: blood pressure returned to baseline and stable Postop Assessment: no headache, no backache, no apparent nausea or vomiting and patient able to bend at knees Anesthetic complications: no   No notable events documented.  Last Vitals:  Vitals:   08/02/21 1231 08/02/21 1427  BP: 115/68 117/65  Pulse: (!) 59 77  Resp: 16 18  Temp: 36.4 C 36.7 C  SpO2: 100% 94%    Last Pain:  Vitals:   08/02/21 1427  TempSrc: Oral  PainSc:                  Trevor Iha

## 2021-08-02 NOTE — Transfer of Care (Signed)
Immediate Anesthesia Transfer of Care Note  Patient: Jeanette Rodriguez  Procedure(s) Performed: TOTAL KNEE ARTHROPLASTY (Left: Knee)  Patient Location: PACU  Anesthesia Type:Spinal and MAC combined with regional for post-op pain  Level of Consciousness: awake, alert , oriented and patient cooperative  Airway & Oxygen Therapy: Patient Spontanous Breathing and Patient connected to face mask oxygen  Post-op Assessment: Report given to RN and Post -op Vital signs reviewed and stable  Post vital signs: Reviewed and stable  Last Vitals:  Vitals Value Taken Time  BP    Temp    Pulse    Resp    SpO2      Last Pain:  Vitals:   08/02/21 0828  TempSrc:   PainSc: 0-No pain      Patients Stated Pain Goal: 3 (93/[id number])  Complications: No notable events documented.

## 2021-08-02 NOTE — Op Note (Signed)
OPERATIVE REPORT-TOTAL KNEE ARTHROPLASTY   Pre-operative diagnosis- Osteoarthritis  Left knee(s)  Post-operative diagnosis- Osteoarthritis Left knee(s)  Procedure-  Left  Total Knee Arthroplasty  Surgeon- Gus Rankin. Belvia Gotschall, MD  Assistant- Arcola Jansky, PA-C   Anesthesia-   Adductor canal block and spinal  EBL-25 mL   Drains None  Tourniquet time-  Total Tourniquet Time Documented: Thigh (Left) - 36 minutes Total: Thigh (Left) - 36 minutes     Complications- None  Condition-PACU - hemodynamically stable.   Brief Clinical Note  Jeanette Rodriguez is a 66 y.o. year old female with end stage OA of her left knee with progressively worsening pain and dysfunction. She has constant pain, with activity and at rest and significant functional deficits with difficulties even with ADLs. She has had extensive non-op management including analgesics, injections of cortisone and viscosupplements, and home exercise program, but remains in significant pain with significant dysfunction. Radiographs show bone on bone arthritis medial and patellofemoral. She presents now for left Total Knee Arthroplasty.     Procedure in detail---   The patient is brought into the operating room and positioned supine on the operating table. After successful administration of  Adductor canal block and spinal,   a tourniquet is placed high on the  Left thigh(s) and the lower extremity is prepped and draped in the usual sterile fashion. Time out is performed by the operating team and then the  Left lower extremity is wrapped in Esmarch, knee flexed and the tourniquet inflated to 300 mmHg.       A midline incision is made with a ten blade through the subcutaneous tissue to the level of the extensor mechanism. A fresh blade is used to make a medial parapatellar arthrotomy. Soft tissue over the proximal medial tibia is subperiosteally elevated to the joint line with a knife and into the semimembranosus bursa with a Cobb  elevator. Soft tissue over the proximal lateral tibia is elevated with attention being paid to avoiding the patellar tendon on the tibial tubercle. The patella is everted, knee flexed 90 degrees and the ACL and PCL are removed. Findings are bone on bone medial and patellofemoral with large global osteophytes        The drill is used to create a starting hole in the distal femur and the canal is thoroughly irrigated with sterile saline to remove the fatty contents. The 5 degree Left  valgus alignment guide is placed into the femoral canal and the distal femoral cutting block is pinned to remove 9 mm off the distal femur. Resection is made with an oscillating saw.      The tibia is subluxed forward and the menisci are removed. The extramedullary alignment guide is placed referencing proximally at the medial aspect of the tibial tubercle and distally along the second metatarsal axis and tibial crest. The block is pinned to remove 60mm off the more deficient medial  side. Resection is made with an oscillating saw. Size 5is the most appropriate size for the tibia and the proximal tibia is prepared with the modular drill and keel punch for that size.      The femoral sizing guide is placed and size 5 is most appropriate. Rotation is marked off the epicondylar axis and confirmed by creating a rectangular flexion gap at 90 degrees. The size 5 cutting block is pinned in this rotation and the anterior, posterior and chamfer cuts are made with the oscillating saw. The intercondylar block is then placed and that cut is made.  Trial size 5 tibial component, trial size 5 posterior stabilized femur and a 8  mm posterior stabilized rotating platform insert trial is placed. Full extension is achieved with excellent varus/valgus and anterior/posterior balance throughout full range of motion. The patella is everted and thickness measured to be 22  mm. Free hand resection is taken to 12 mm, a 35 template is placed, lug holes are  drilled, trial patella is placed, and it tracks normally. Osteophytes are removed off the posterior femur with the trial in place. All trials are removed and the cut bone surfaces prepared with pulsatile lavage. Cement is mixed and once ready for implantation, the size 5 tibial implant, size  5 posterior stabilized femoral component, and the size 35 patella are cemented in place and the patella is held with the clamp. The trial insert is placed and the knee held in full extension. The Exparel (20 ml mixed with 60 ml saline) is injected into the extensor mechanism, posterior capsule, medial and lateral gutters and subcutaneous tissues.  All extruded cement is removed and once the cement is hard the permanent 8 mm posterior stabilized rotating platform insert is placed into the tibial tray.      The wound is copiously irrigated with saline solution and the extensor mechanism closed with # 0 Stratofix suture. The tourniquet is released for a total tourniquet time of 36  minutes. Flexion against gravity is 140 degrees and the patella tracks normally. Subcutaneous tissue is closed with 2.0 vicryl and subcuticular with running 4.0 Monocryl. The incision is cleaned and dried and steri-strips and a bulky sterile dressing are applied. The limb is placed into a knee immobilizer and the patient is awakened and transported to recovery in stable condition.      Please note that a surgical assistant was a medical necessity for this procedure in order to perform it in a safe and expeditious manner. Surgical assistant was necessary to retract the ligaments and vital neurovascular structures to prevent injury to them and also necessary for proper positioning of the limb to allow for anatomic placement of the prosthesis.   Dione Plover Monda Chastain, MD    08/02/2021, 10:49 AM

## 2021-08-02 NOTE — Interval H&P Note (Signed)
History and Physical Interval Note:  08/02/2021 8:47 AM  Jeanette Rodriguez  has presented today for surgery, with the diagnosis of left knee osteoarthritis.  The various methods of treatment have been discussed with the patient and family. After consideration of risks, benefits and other options for treatment, the patient has consented to  Procedure(s): TOTAL KNEE ARTHROPLASTY (Left) as a surgical intervention.  The patient's history has been reviewed, patient examined, no change in status, stable for surgery.  I have reviewed the patient's chart and labs.  Questions were answered to the patient's satisfaction.     Pilar Plate Trinidee Schrag

## 2021-08-02 NOTE — Evaluation (Signed)
Physical Therapy Evaluation Patient Details Name: Jeanette Rodriguez MRN: [id number] DOB: 02-Aug-1955 Today's Date: 08/02/2021  History of Present Illness  66 yo female s/p L TKA 08/02/21. PMH: R TKA, pna, multiple ankle surgeries  Clinical Impression  Pt is s/p TKA resulting in the deficits listed below (see PT Problem List).  Pt amb 40' with RW and min assist. Distance limited by PT d/t incr pain with incr mobility.  Pt reports pain control issues last year after TKA with 3day stay d/t pain  Pt will benefit from skilled PT to increase their independence and safety with mobility to allow discharge to the venue listed below.         Recommendations for follow up therapy are one component of a multi-disciplinary discharge planning process, led by the attending physician.  Recommendations may be updated based on patient status, additional functional criteria and insurance authorization.  Follow Up Recommendations Follow physician's recommendations for discharge plan and follow up therapies    Assistance Recommended at Discharge Intermittent Supervision/Assistance  Patient can return home with the following  Assistance with cooking/housework;Assist for transportation;Help with stairs or ramp for entrance;A little help with bathing/dressing/bathroom    Equipment Recommendations None recommended by PT  Recommendations for Other Services       Functional Status Assessment Patient has had a recent decline in their functional status and demonstrates the ability to make significant improvements in function in a reasonable and predictable amount of time.     Precautions / Restrictions Precautions Precautions: Knee Required Braces or Orthoses: Knee Immobilizer - Left Knee Immobilizer - Left: Discontinue once straight leg raise with < 10 degree lag Restrictions Weight Bearing Restrictions: No Other Position/Activity Restrictions: WBAT      Mobility  Bed Mobility Overal bed mobility: Needs  Assistance Bed Mobility: Supine to Sit     Supine to sit: Supervision     General bed mobility comments: for safety    Transfers Overall transfer level: Needs assistance Equipment used: Rolling walker (2 wheels) Transfers: Sit to/from Stand Sit to Stand: Min assist, Min guard           General transfer comment: cues for hand placement and LLE position    Ambulation/Gait Ambulation/Gait assistance: Min guard, Min assist Gait Distance (Feet): 40 Feet Assistive device: Rolling walker (2 wheels) Gait Pattern/deviations: Step-to pattern, Antalgic, Decreased stance time - left       General Gait Details: cues for sequence and RW position from self. distance limited by PT d/t incr pain  Stairs            Wheelchair Mobility    Modified Rankin (Stroke Patients Only)       Balance                                             Pertinent Vitals/Pain Pain Assessment Pain Assessment: 0-10 Pain Score: 8  Pain Location: left knee Pain Descriptors / Indicators: Aching, Sore, Constant Pain Intervention(s): Limited activity within patient's tolerance, Monitored during session, Premedicated before session, Repositioned, Patient requesting pain meds-RN notified, RN gave pain meds during session, Ice applied    Home Living Family/patient expects to be discharged to:: Private residence Living Arrangements: Spouse/significant other Available Help at Discharge: Family Type of Home: House Home Access: Stairs to enter Entrance Stairs-Rails: Right Entrance Stairs-Number of Steps: 3   Home Layout: One level Home Equipment:  Rolling Walker (2 wheels);Rollator (4 wheels);Other (comment) Additional Comments: 3 wheeled    Prior Function Prior Level of Function : Independent/Modified Independent             Mobility Comments: amb with walker or cane or furniture       Hand Dominance        Extremity/Trunk Assessment   Upper Extremity  Assessment Upper Extremity Assessment: Overall WFL for tasks assessed    Lower Extremity Assessment Lower Extremity Assessment: LLE deficits/detail LLE Deficits / Details: ankle WFL, knee extension and hip flexion 2+/5       Communication      Cognition Arousal/Alertness: Awake/alert Behavior During Therapy: WFL for tasks assessed/performed Overall Cognitive Status: Within Functional Limits for tasks assessed                                          General Comments      Exercises Total Joint Exercises Ankle Circles/Pumps: AROM, Both, 10 reps   Assessment/Plan    PT Assessment Patient needs continued PT services  PT Problem List Decreased strength;Decreased mobility;Decreased range of motion;Decreased activity tolerance;Pain       PT Treatment Interventions DME instruction;Therapeutic exercise;Gait training;Functional mobility training;Therapeutic activities;Patient/family education;Stair training    PT Goals (Current goals can be found in the Care Plan section)  Acute Rehab PT Goals Patient Stated Goal: home, pain controlled PT Goal Formulation: With patient Time For Goal Achievement: 08/09/21 Potential to Achieve Goals: Good    Frequency 7X/week     Co-evaluation               AM-PAC PT "6 Clicks" Mobility  Outcome Measure Help needed turning from your back to your side while in a flat bed without using bedrails?: A Little Help needed moving from lying on your back to sitting on the side of a flat bed without using bedrails?: A Little Help needed moving to and from a bed to a chair (including a wheelchair)?: A Little Help needed standing up from a chair using your arms (e.g., wheelchair or bedside chair)?: A Little Help needed to walk in hospital room?: A Little Help needed climbing 3-5 steps with a railing? : A Lot 6 Click Score: 17    End of Session Equipment Utilized During Treatment: Gait belt Activity Tolerance: Patient  tolerated treatment well Patient left: in chair;with call bell/phone within reach;with chair alarm set;with family/visitor present Nurse Communication: Mobility status PT Visit Diagnosis: Other abnormalities of gait and mobility (R26.89);Difficulty in walking, not elsewhere classified (R26.2)    Time: 3893-7342 PT Time Calculation (min) (ACUTE ONLY): 24 min   Charges:   PT Evaluation $PT Eval Low Complexity: 1 Low PT Treatments $Gait Training: 8-22 mins        Delice Bison, PT  Acute Rehab Dept ([facility]) [phone] Pager [phone]  08/02/2021   [facility] 08/02/2021, 3:31 PM

## 2021-08-02 NOTE — Discharge Instructions (Signed)
 Frank Aluisio, MD Total Joint Specialist [facility] [place] [address] [place], [place] [address] [phone]  TOTAL KNEE REPLACEMENT POSTOPERATIVE DIRECTIONS    Knee Rehabilitation, Guidelines Following Surgery  Results after knee surgery are often greatly improved when you follow the exercise, range of motion and muscle strengthening exercises prescribed by your doctor. Safety measures are also important to protect the knee from further injury. If any of these exercises cause you to have increased pain or swelling in your knee joint, decrease the amount until you are comfortable again and slowly increase them. If you have problems or questions, call your caregiver or physical therapist for advice.   BLOOD CLOT PREVENTION Take a 325 mg Aspirin two times a day for three weeks following surgery. Then take an 81 mg Aspirin once a day for three weeks. Then discontinue Aspirin. You may resume your vitamins/supplements upon discharge from the hospital. Do not take any NSAIDs (Advil, Aleve, Ibuprofen, Meloxicam, etc.) until you have discontinued the 325 mg Aspirin.  HOME CARE INSTRUCTIONS  Remove items at home which could result in a fall. This includes throw rugs or furniture in walking pathways.  ICE to the affected knee as much as tolerated. Icing helps control swelling. If the swelling is well controlled you will be more comfortable and rehab easier. Continue to use ice on the knee for pain and swelling from surgery. You may notice swelling that will progress down to the foot and ankle. This is normal after surgery. Elevate the leg when you are not up walking on it.    Continue to use the breathing machine which will help keep your temperature down. It is common for your temperature to cycle up and down following surgery, especially at night when you are not up moving around and exerting yourself. The breathing machine keeps your lungs expanded and your temperature  down. Do not place pillow under the operative knee, focus on keeping the knee straight while resting  DIET You may resume your previous home diet once you are discharged from the hospital.  DRESSING / WOUND CARE / SHOWERING Keep your bulky bandage on for 2 days. On the third post-operative day you may remove the Ace bandage and gauze. There is a waterproof adhesive bandage on your skin which will stay in place until your first follow-up appointment. Once you remove this you will not need to place another bandage You may begin showering 3 days following surgery, but do not submerge the incision under water.  ACTIVITY For the first 5 days, the key is rest and control of pain and swelling Do your home exercises twice a day starting on post-operative day 3. On the days you go to physical therapy, just do the home exercises once that day. You should rest, ice and elevate the leg for 50 minutes out of every hour. Get up and walk/stretch for 10 minutes per hour. After 5 days you can increase your activity slowly as tolerated. Walk with your walker as instructed. Use the walker until you are comfortable transitioning to a cane. Walk with the cane in the opposite hand of the operative leg. You may discontinue the cane once you are comfortable and walking steadily. Avoid periods of inactivity such as sitting longer than an hour when not asleep. This helps prevent blood clots.  You may discontinue the knee immobilizer once you are able to perform a straight leg raise while lying down. You may resume a sexual relationship in one month   or when given the OK by your doctor.  You may return to work once you are cleared by your doctor.  Do not drive a car for 6 weeks or until released by your surgeon.  Do not drive while taking narcotics.  TED HOSE STOCKINGS Wear the elastic stockings on both legs for three weeks following surgery during the day. You may remove them at night for sleeping.  WEIGHT  BEARING Weight bearing as tolerated with assist device (walker, cane, etc) as directed, use it as long as suggested by your surgeon or therapist, typically at least 4-6 weeks.  POSTOPERATIVE CONSTIPATION PROTOCOL Constipation - defined medically as fewer than three stools per week and severe constipation as less than one stool per week.  One of the most common issues patients have following surgery is constipation.  Even if you have a regular bowel pattern at home, your normal regimen is likely to be disrupted due to multiple reasons following surgery.  Combination of anesthesia, postoperative narcotics, change in appetite and fluid intake all can affect your bowels.  In order to avoid complications following surgery, here are some recommendations in order to help you during your recovery period.  Colace (docusate) - Pick up an over-the-counter form of Colace or another stool softener and take twice a day as long as you are requiring postoperative pain medications.  Take with a full glass of water daily.  If you experience loose stools or diarrhea, hold the colace until you stool forms back up. If your symptoms do not get better within 1 week or if they get worse, check with your doctor. Dulcolax (bisacodyl) - Pick up over-the-counter and take as directed by the product packaging as needed to assist with the movement of your bowels.  Take with a full glass of water.  Use this product as needed if not relieved by Colace only.  MiraLax (polyethylene glycol) - Pick up over-the-counter to have on hand. MiraLax is a solution that will increase the amount of water in your bowels to assist with bowel movements.  Take as directed and can mix with a glass of water, juice, soda, coffee, or tea. Take if you go more than two days without a movement. Do not use MiraLax more than once per day. Call your doctor if you are still constipated or irregular after using this medication for 7 days in a row.  If you continue  to have problems with postoperative constipation, please contact the office for further assistance and recommendations.  If you experience "the worst abdominal pain ever" or develop nausea or vomiting, please contact the office immediatly for further recommendations for treatment.  ITCHING If you experience itching with your medications, try taking only a single pain pill, or even half a pain pill at a time.  You can also use Benadryl over the counter for itching or also to help with sleep.   MEDICATIONS See your medication summary on the "After Visit Summary" that the nursing staff will review with you prior to discharge.  You may have some home medications which will be placed on hold until you complete the course of blood thinner medication.  It is important for you to complete the blood thinner medication as prescribed by your surgeon.  Continue your approved medications as instructed at time of discharge.  PRECAUTIONS If you experience chest pain or shortness of breath - call 911 immediately for transfer to the hospital emergency department.  If you develop a fever greater that 101 F,   purulent drainage from wound, increased redness or drainage from wound, foul odor from the wound/dressing, or calf pain - CONTACT YOUR SURGEON.                                                   FOLLOW-UP APPOINTMENTS Make sure you keep all of your appointments after your operation with your surgeon and caregivers. You should call the office at the above phone number and make an appointment for approximately two weeks after the date of your surgery or on the date instructed by your surgeon outlined in the "After Visit Summary".  RANGE OF MOTION AND STRENGTHENING EXERCISES  Rehabilitation of the knee is important following a knee injury or an operation. After just a few days of immobilization, the muscles of the thigh which control the knee become weakened and shrink (atrophy). Knee exercises are designed to build up  the tone and strength of the thigh muscles and to improve knee motion. Often times heat used for twenty to thirty minutes before working out will loosen up your tissues and help with improving the range of motion but do not use heat for the first two weeks following surgery. These exercises can be done on a training (exercise) mat, on the floor, on a table or on a bed. Use what ever works the best and is most comfortable for you Knee exercises include:  Leg Lifts - While your knee is still immobilized in a splint or cast, you can do straight leg raises. Lift the leg to 60 degrees, hold for 3 sec, and slowly lower the leg. Repeat 10-20 times 2-3 times daily. Perform this exercise against resistance later as your knee gets better.  Quad and Hamstring Sets - Tighten up the muscle on the front of the thigh (Quad) and hold for 5-10 sec. Repeat this 10-20 times hourly. Hamstring sets are done by pushing the foot backward against an object and holding for 5-10 sec. Repeat as with quad sets.  Leg Slides: Lying on your back, slowly slide your foot toward your buttocks, bending your knee up off the floor (only go as far as is comfortable). Then slowly slide your foot back down until your leg is flat on the floor again. Angel Wings: Lying on your back spread your legs to the side as far apart as you can without causing discomfort.  A rehabilitation program following serious knee injuries can speed recovery and prevent re-injury in the future due to weakened muscles. Contact your doctor or a physical therapist for more information on knee rehabilitation.   POST-OPERATIVE OPIOID TAPER INSTRUCTIONS: It is important to wean off of your opioid medication as soon as possible. If you do not need pain medication after your surgery it is ok to stop day one. Opioids include: Codeine, Hydrocodone(Norco, Vicodin), Oxycodone(Percocet, oxycontin) and hydromorphone amongst others.  Long term and even short term use of opiods can  cause: Increased pain response Dependence Constipation Depression Respiratory depression And more.  Withdrawal symptoms can include Flu like symptoms Nausea, vomiting And more Techniques to manage these symptoms Hydrate well Eat regular healthy meals Stay active Use relaxation techniques(deep breathing, meditating, yoga) Do Not substitute Alcohol to help with tapering If you have been on opioids for less than two weeks and do not have pain than it is ok to stop all together.  Plan   to wean off of opioids This plan should start within one week post op of your joint replacement. Maintain the same interval or time between taking each dose and first decrease the dose.  Cut the total daily intake of opioids by one tablet each day Next start to increase the time between doses. The last dose that should be eliminated is the evening dose.   IF YOU ARE TRANSFERRED TO A SKILLED REHAB FACILITY If the patient is transferred to a skilled rehab facility following release from the hospital, a list of the current medications will be sent to the facility for the patient to continue.  When discharged from the skilled rehab facility, please have the facility set up the patient's Home Health Physical Therapy prior to being released. Also, the skilled facility will be responsible for providing the patient with their medications at time of release from the facility to include their pain medication, the muscle relaxants, and their blood thinner medication. If the patient is still at the rehab facility at time of the two week follow up appointment, the skilled rehab facility will also need to assist the patient in arranging follow up appointment in our office and any transportation needs.  MAKE SURE YOU:  Understand these instructions.  Get help right away if you are not doing well or get worse.   DENTAL ANTIBIOTICS:  In most cases prophylactic antibiotics for Dental procdeures after total joint surgery are  not necessary.  Exceptions are as follows:  1. History of prior total joint infection  2. Severely immunocompromised (Organ Transplant, cancer chemotherapy, Rheumatoid biologic medications such as Humera)  3. Poorly controlled diabetes (A1C &gt; 8.0, blood glucose over 200)  If you have one of these conditions, contact your surgeon for an antibiotic prescription, prior to your dental procedure.    Pick up stool softner and laxative for home use following surgery while on pain medications. Do not submerge incision under water. Please use good hand washing techniques while changing dressing each day. May shower starting three days after surgery. Please use a clean towel to pat the incision dry following showers. Continue to use ice for pain and swelling after surgery. Do not use any lotions or creams on the incision until instructed by your surgeon.  

## 2021-08-02 NOTE — Anesthesia Procedure Notes (Signed)
Spinal  Patient location during procedure: OR Start time: 08/02/2021 9:47 AM End time: 08/02/2021 9:52 AM Reason for block: surgical anesthesia Staffing Performed: anesthesiologist  Anesthesiologist: Trevor Iha, MD Performed by: Trevor Iha, MD Authorized by: Trevor Iha, MD   Preanesthetic Checklist Completed: patient identified, IV checked, risks and benefits discussed, surgical consent, monitors and equipment checked, pre-op evaluation and timeout performed Spinal Block Patient position: sitting Prep: DuraPrep and site prepped and draped Patient monitoring: heart rate, cardiac monitor, continuous pulse ox and blood pressure Approach: midline Location: L3-4 Injection technique: single-shot Needle Needle type: Pencan  Needle gauge: 24 G Needle length: 10 cm Needle insertion depth: 6 cm Assessment Sensory level: T4 Events: CSF return Additional Notes  1 Attempt (s). Pt tolerated procedure well.

## 2021-08-02 NOTE — Plan of Care (Signed)
  Problem: Activity: Goal: Risk for activity intolerance will decrease Outcome: Progressing   Problem: Pain Managment: Goal: General experience of comfort will improve Outcome: Progressing   Problem: Safety: Goal: Ability to remain free from injury will improve Outcome: Progressing   

## 2021-08-02 NOTE — Care Plan (Signed)
Ortho Bundle Case Management Note  Patient Details  Name: Betta Balla MRN: [id number] Date of Birth: [birth year]                  L TKA on 08-02-21 DCP: Home with wife DME: No needs, has a RW PT: [facility]. Referral fax for [facility] on 08-06-21.   DME Arranged:  N/A DME Agency:     HH Arranged:    HH Agency:     Additional Comments: Please contact me with any questions of if this plan should need to change.  Ennis Forts, RN,CCM [facility]  [phone] 08/02/2021, 1:38 PM

## 2021-08-02 NOTE — Progress Notes (Signed)
Orthopedic Tech Progress Note Patient Details:  Jeanette Rodriguez October 13, 1955 [id number]  CPM Left Knee CPM Left Knee: On Left Knee Flexion (Degrees): 40 Left Knee Extension (Degrees): 10  Post Interventions Patient Tolerated: Well Instructions Provided: Care of device, Adjustment of device  Saul Fordyce 08/02/2021, 11:01 AM

## 2021-08-02 NOTE — Progress Notes (Signed)
AssistedDr. Houser with left, adductor canal block. Side rails up, monitors on throughout procedure. See vital signs in flow sheet. Tolerated Procedure well.  

## 2021-08-03 ENCOUNTER — Other Ambulatory Visit (HOSPITAL_BASED_OUTPATIENT_CLINIC_OR_DEPARTMENT_OTHER): Payer: Self-pay

## 2021-08-03 ENCOUNTER — Encounter (HOSPITAL_COMMUNITY): Payer: Self-pay | Admitting: Orthopedic Surgery

## 2021-08-03 DIAGNOSIS — M1712 Unilateral primary osteoarthritis, left knee: Secondary | ICD-10-CM | POA: Diagnosis not present

## 2021-08-03 LAB — CBC
HCT: 35.8 % — ABNORMAL LOW (ref 36.0–46.0)
Hemoglobin: 11.7 g/dL — ABNORMAL LOW (ref 12.0–15.0)
MCH: 29.8 pg (ref 26.0–34.0)
MCHC: 32.7 g/dL (ref 30.0–36.0)
MCV: 91.1 fL (ref 80.0–100.0)
Platelets: 406 10*3/uL — ABNORMAL HIGH (ref 150–400)
RBC: 3.93 MIL/uL (ref 3.87–5.11)
RDW: 12.9 % (ref 11.5–15.5)
WBC: 15.6 10*3/uL — ABNORMAL HIGH (ref 4.0–10.5)
nRBC: 0 % (ref 0.0–0.2)

## 2021-08-03 LAB — BASIC METABOLIC PANEL
Anion gap: 6 (ref 5–15)
BUN: 18 mg/dL (ref 8–23)
CO2: 26 mmol/L (ref 22–32)
Calcium: 8.6 mg/dL — ABNORMAL LOW (ref 8.9–10.3)
Chloride: 111 mmol/L (ref 98–111)
Creatinine, Ser: 0.75 mg/dL (ref 0.44–1.00)
GFR, Estimated: >60 mL/min (ref >60)
Glucose, Bld: 135 mg/dL — ABNORMAL HIGH (ref 70–99)
Potassium: 3 mmol/L — ABNORMAL LOW (ref 3.5–5.1)
Sodium: 143 mmol/L (ref 135–145)

## 2021-08-03 MED ORDER — POTASSIUM CHLORIDE CRYS ER 20 MEQ PO TBCR
40.0000 meq | EXTENDED_RELEASE_TABLET | ORAL | Status: AC
  Administered 2021-08-03 (×3): 40 meq via ORAL
  Filled 2021-08-03 (×3): qty 2

## 2021-08-03 MED ORDER — METHOCARBAMOL 500 MG PO TABS
500.0000 mg | ORAL_TABLET | Freq: Four times a day (QID) | ORAL | 0 refills | Status: DC | PRN
  Filled 2021-08-03: qty 40, 10d supply, fill #0

## 2021-08-03 MED ORDER — TRAMADOL HCL 50 MG PO TABS
50.0000 mg | ORAL_TABLET | Freq: Four times a day (QID) | ORAL | 0 refills | Status: DC | PRN
  Filled 2021-08-03: qty 40, 5d supply, fill #0

## 2021-08-03 MED ORDER — ASPIRIN 325 MG PO TBEC
325.0000 mg | DELAYED_RELEASE_TABLET | Freq: Two times a day (BID) | ORAL | 0 refills | Status: AC
Start: 2021-08-03 — End: 2021-08-23
  Filled 2021-08-03: qty 40, 20d supply, fill #0

## 2021-08-03 MED ORDER — ROPIVACAINE HCL 5 MG/ML IJ SOLN
INTRAMUSCULAR | Status: DC | PRN
  Administered 2021-08-02: 30 mL via PERINEURAL

## 2021-08-03 MED ORDER — CLONIDINE HCL (ANALGESIA) 100 MCG/ML EP SOLN
EPIDURAL | Status: DC | PRN
  Administered 2021-08-02: 100 ug

## 2021-08-03 MED ORDER — HYDROMORPHONE HCL 2 MG PO TABS
2.0000 mg | ORAL_TABLET | ORAL | Status: DC | PRN
  Administered 2021-08-03 (×2): 4 mg via ORAL
  Filled 2021-08-03 (×2): qty 2

## 2021-08-03 MED ORDER — HYDROMORPHONE HCL 2 MG PO TABS
2.0000 mg | ORAL_TABLET | Freq: Four times a day (QID) | ORAL | 0 refills | Status: DC | PRN
  Filled 2021-08-03: qty 42, 6d supply, fill #0

## 2021-08-03 NOTE — Addendum Note (Signed)
Addendum  created 08/03/21 1135 by Barnet Glasgow, MD   Child order released for a procedure order, Clinical Note Signed, Intraprocedure Blocks edited, Intraprocedure Meds edited, Order Canceled from Note, SmartForm saved

## 2021-08-03 NOTE — TOC Transition Note (Signed)
Transition of Care (TOC) - CM/SW Discharge Note   Patient Details  Name: Jeanette Rodriguez MRN: [id number] Date of Birth: 10/15/1955  Transition of Care (TOC) CM/SW Contact:  HOYLE, LUCY, LCSW Phone Number: 08/03/2021, 9:34 AM   Clinical Narrative:    Met with pt and confirming she has all needed DME at home.  Plan for OPPT already set up with [facility].  No TOC needs.   Final next level of care: OP Rehab Barriers to Discharge: No Barriers Identified   Patient Goals and CMS Choice Patient states their goals for this hospitalization and ongoing recovery are:: return home      Discharge Placement                       Discharge Plan and Services                DME Arranged: N/A                    Social Determinants of Health (SDOH) Interventions     Readmission Risk Interventions     No data to display             

## 2021-08-03 NOTE — Progress Notes (Signed)
Provided discharge education/instructions, all questions and concerns addressed. Pt not ina cute distress, discharged home with belongings accompanied by wife. 

## 2021-08-03 NOTE — Anesthesia Procedure Notes (Signed)
Anesthesia Regional Block: Adductor canal block   Pre-Anesthetic Checklist: , timeout performed,  Correct Patient, Correct Site, Correct Laterality,  Correct Procedure, Correct Position, site marked,  Risks and benefits discussed,  Surgical consent,  Pre-op evaluation,  At surgeon's request and post-op pain management  Laterality: Lower and Left  Prep: chloraprep       Needles:  Injection technique: Single-shot  Needle Type: Echogenic Needle     Needle Length: 9cm  Needle Gauge: 22     Additional Needles:   Procedures:,,,, ultrasound used (permanent image in chart),,    Narrative:  Start time: 08/02/2021 9:35 AM End time: 08/02/2021 9:41 AM Injection made incrementally with aspirations every 5 mL.  Performed by: Personally  Anesthesiologist: Trevor Iha, MD  Additional Notes: Block assessed prior to surgery. Pt tolerated procedure well.

## 2021-08-03 NOTE — Progress Notes (Signed)
   Subjective: 1 Day Post-Op Procedure(s) (LRB): TOTAL KNEE ARTHROPLASTY (Left) Patient reports pain as mild.   Patient seen in rounds by Dr. Lequita Halt. Patient is well, and has had no acute complaints or problems No issues overnight. Denies chest pain, SOB, or calf pain. Foley catheter removed this AM.  We will continue therapy today, ambulated 40' yesterday.   Objective: Vital signs in last 24 hours: Temp:  [97.5 F (36.4 C)-98.4 F (36.9 C)] 97.7 F (36.5 C) (06/13 0635) Pulse Rate:  [55-81] 66 (06/13 0635) Resp:  [13-20] 18 (06/13 0635) BP: (105-152)/(65-89) 135/68 (06/13 0635) SpO2:  [94 %-100 %] 99 % (06/13 0635) Weight:  [76.2 kg] 76.2 kg (06/12 0828)  Intake/Output from previous day:  Intake/Output Summary (Last 24 hours) at 08/03/2021 0730 Last data filed at 08/03/2021 0645 Gross per 24 hour  Intake 3440 ml  Output 1775 ml  Net 1665 ml     Intake/Output this shift: No intake/output data recorded.  Labs: Recent Labs    08/03/21 0323  HGB 11.7*   Recent Labs    08/03/21 0323  WBC 15.6*  RBC 3.93  HCT 35.8*  PLT 406*   Recent Labs    08/03/21 0323  NA 143  K 3.0*  CL 111  CO2 26  BUN 18  CREATININE 0.75  GLUCOSE 135*  CALCIUM 8.6*   No results for input(s): "LABPT", "INR" in the last 72 hours.  Exam: General - Patient is Alert and Oriented Extremity - Neurologically intact Neurovascular intact Sensation intact distally Dorsiflexion/Plantar flexion intact Dressing - dressing C/D/I Motor Function - intact, moving foot and toes well on exam.   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   Weight as of this encounter: 76.2 kg. Advance diet Up with therapy D/C IV fluids   Patient's anticipated LOS is less than 2 midnights, meeting these requirements: - Lives within 1 hour of care - Has a competent adult at home to recover with post-op recover - NO history of  - Chronic pain requiring opioids  - Diabetes  - Coronary Artery Disease  - Heart failure  - Heart attack  - Stroke  - DVT/VTE  - Cardiac arrhythmia  - Respiratory Failure/COPD  - Renal failure  - Anemia  - Advanced Liver disease  DVT Prophylaxis - Aspirin Weight bearing as tolerated. Continue therapy.  Potassium 3.0 this AM, 3 doses of 40 mEq Kcl ordered.  Plan is to go Home after hospital stay. Plan for discharge later today if progresses with therapy and meeting goals. Scheduled for OPPT at [facility]). Follow-up in the office in 2 weeks.  The PDMP database was reviewed today prior to any opioid medications being prescribed to this patient.  [facility], PA-C Orthopedic Surgery [phone] 08/03/2021, 7:30 AM

## 2021-08-03 NOTE — Progress Notes (Signed)
Physical Therapy Treatment Patient Details Name: Jeanette Rodriguez MRN: [id number] DOB: [birth year] Today's Date: 08/03/2021   History of Present Illness 66 yo female s/p L TKA 08/02/21. PMH: R TKA, pna, multiple ankle surgeries    PT Comments    Pt progressing well. Tol incr amb and initiation of HEP, pain controlled. Will see for a second session to review stairs, pt is hopeful to d/c if pain remains controlled.   Recommendations for follow up therapy are one component of a multi-disciplinary discharge planning process, led by the attending physician.  Recommendations may be updated based on patient status, additional functional criteria and insurance authorization.  Follow Up Recommendations  Follow physician's recommendations for discharge plan and follow up therapies     Assistance Recommended at Discharge Intermittent Supervision/Assistance  Patient can return home with the following Assistance with cooking/housework;Assist for transportation;Help with stairs or ramp for entrance;A little help with bathing/dressing/bathroom   Equipment Recommendations  None recommended by PT    Recommendations for Other Services       Precautions / Restrictions Precautions Precautions: Knee Precaution Comments: independent SLR Required Braces or Orthoses: Knee Immobilizer - Left Knee Immobilizer - Left: Discontinue once straight leg raise with < 10 degree lag Restrictions Weight Bearing Restrictions: Yes LLE Weight Bearing: Weight bearing as tolerated Other Position/Activity Restrictions: WBAT     Mobility  Bed Mobility   Bed Mobility: Supine to Sit     Supine to sit: Modified independent (Device/Increase time)          Transfers Overall transfer level: Needs assistance Equipment used: Rolling walker (2 wheels) Transfers: Sit to/from Stand Sit to Stand: Min guard, Supervision           General transfer comment: cues for hand placement and LLE position     Ambulation/Gait Ambulation/Gait assistance: Min guard, Supervision Gait Distance (Feet): 180 Feet (15' x 2) Assistive device: Rolling walker (2 wheels) Gait Pattern/deviations: Step-to pattern, Antalgic, Decreased stance time - left       General Gait Details: cues for sequence and RW position from self.   Stairs             Wheelchair Mobility    Modified Rankin (Stroke Patients Only)       Balance                                            Cognition Arousal/Alertness: Awake/alert Behavior During Therapy: WFL for tasks assessed/performed Overall Cognitive Status: Within Functional Limits for tasks assessed                                          Exercises Total Joint Exercises Ankle Circles/Pumps: AROM, Both, 10 reps Quad Sets: AROM, 10 reps, Both Heel Slides: AAROM, Left, 5 reps Straight Leg Raises: AROM, Left, 5 reps    General Comments        Pertinent Vitals/Pain Pain Assessment Pain Assessment: 0-10 Pain Score: 5  Pain Location: left knee Pain Descriptors / Indicators: Aching, Sore, Constant Pain Intervention(s): Limited activity within patient's tolerance, Monitored during session, Premedicated before session, Repositioned    Home Living                          Prior Function  PT Goals (current goals can now be found in the care plan section) Acute Rehab PT Goals Patient Stated Goal: home, pain controlled PT Goal Formulation: With patient Time For Goal Achievement: 08/09/21 Potential to Achieve Goals: Good Progress towards PT goals: Progressing toward goals    Frequency    7X/week      PT Plan Current plan remains appropriate    Co-evaluation              AM-PAC PT "6 Clicks" Mobility   Outcome Measure  Help needed turning from your back to your side while in a flat bed without using bedrails?: None Help needed moving from lying on your back to sitting on  the side of a flat bed without using bedrails?: None Help needed moving to and from a bed to a chair (including a wheelchair)?: A Little Help needed standing up from a chair using your arms (e.g., wheelchair or bedside chair)?: A Little Help needed to walk in hospital room?: A Little Help needed climbing 3-5 steps with a railing? : A Little 6 Click Score: 20    End of Session Equipment Utilized During Treatment: Gait belt Activity Tolerance: Patient tolerated treatment well Patient left: in chair;with call bell/phone within reach;with chair alarm set;with nursing/sitter in room Nurse Communication: Mobility status PT Visit Diagnosis: Other abnormalities of gait and mobility (R26.89);Difficulty in walking, not elsewhere classified (R26.2)     Time: 0998-3382 PT Time Calculation (min) (ACUTE ONLY): 32 min  Charges:  $Gait Training: 8-22 mins $Therapeutic Exercise: 8-22 mins                     Delice Bison, PT  Acute Rehab Dept ([facility]) [phone] Pager [phone]  08/03/2021    [facility] 08/03/2021, 12:35 PM

## 2021-08-03 NOTE — Progress Notes (Signed)
08/03/21 1500  PT Visit Information  Last PT Received On 08/03/21  Assistance Needed +1  Pt meeting goals. See below for areas reviewed. Pt is ready to d/c home with family assist as needed   History of Present Illness 66 yo female s/p L TKA 08/02/21. PMH: R TKA, pna, multiple ankle surgeries  Subjective Data  Patient Stated Goal home, pain controlled  Precautions  Precautions Knee  Precaution Comments independent SLR  Required Braces or Orthoses Knee Immobilizer - Left  Knee Immobilizer - Left Discontinue once straight leg raise with < 10 degree lag  Restrictions  Other Position/Activity Restrictions WBAT  Pain Assessment  Pain Assessment 0-10  Pain Score 4  Pain Location left knee  Pain Descriptors / Indicators Aching;Sore;Constant  Pain Intervention(s) Limited activity within patient's tolerance;Monitored during session;Premedicated before session;Repositioned  Cognition  Arousal/Alertness Awake/alert  Behavior During Therapy WFL for tasks assessed/performed  Overall Cognitive Status Within Functional Limits for tasks assessed  Bed Mobility  General bed mobility comments in recliner  Transfers  Overall transfer level Needs assistance  Equipment used Rolling walker (2 wheels)  Transfers Sit to/from Stand  Sit to Stand Supervision  General transfer comment cues for hand placement and LLE position  Ambulation/Gait  Ambulation/Gait assistance Min guard;Supervision  Gait Distance (Feet) 40 Feet (10' more)  Assistive device Rolling walker (2 wheels)  Gait Pattern/deviations Step-to pattern;Antalgic;Decreased stance time - left  General Gait Details cues for sequence  Stairs Yes  Stairs assistance Min guard  Stair Management One rail Left;Step to pattern;Sideways  Number of Stairs 2  General stair comments verbal cues for sequence, KI used at pt request for incr knee stability. min/guard for safety. wife present for session, both comfortable with stair review; verbally  discussed posterior technique for steps to bed, pt and wife familiar from previous surgeries  PT - End of Session  Equipment Utilized During Treatment Gait belt  Activity Tolerance Patient tolerated treatment well  Patient left in chair;with call bell/phone within reach;with chair alarm set;with nursing/sitter in room  Nurse Communication Mobility status   PT - Assessment/Plan  PT Plan Current plan remains appropriate  PT Visit Diagnosis Other abnormalities of gait and mobility (R26.89);Difficulty in walking, not elsewhere classified (R26.2)  PT Frequency (ACUTE ONLY) 7X/week  Follow Up Recommendations Follow physician's recommendations for discharge plan and follow up therapies  Assistance recommended at discharge Intermittent Supervision/Assistance  Patient can return home with the following Assistance with cooking/housework;Assist for transportation;Help with stairs or ramp for entrance;A little help with bathing/dressing/bathroom  PT equipment None recommended by PT  AM-PAC PT "6 Clicks" Mobility Outcome Measure (Version 2)  Help needed turning from your back to your side while in a flat bed without using bedrails? 4  Help needed moving from lying on your back to sitting on the side of a flat bed without using bedrails? 4  Help needed moving to and from a bed to a chair (including a wheelchair)? 4  Help needed standing up from a chair using your arms (e.g., wheelchair or bedside chair)? 4  Help needed to walk in hospital room? 3  Help needed climbing 3-5 steps with a railing?  3  6 Click Score 22  Consider Recommendation of Discharge To: Home with no services  Progressive Mobility  What is the highest level of mobility based on the progressive mobility assessment? Level 5 (Walks with assist in room/hall) - Balance while stepping forward/back and can walk in room with assist - Complete  Activity  Ambulated with assistance in hallway  PT Goal Progression  Progress towards PT goals  Progressing toward goals  Acute Rehab PT Goals  PT Goal Formulation With patient  Time For Goal Achievement 08/09/21  Potential to Achieve Goals Good  PT Time Calculation  PT Start Time (ACUTE ONLY) 1519  PT Stop Time (ACUTE ONLY) 1548  PT Time Calculation (min) (ACUTE ONLY) 29 min  PT General Charges  $$ ACUTE PT VISIT 1 Visit  PT Treatments  $Gait Training 23-37 mins

## 2021-08-03 NOTE — Plan of Care (Signed)

## 2021-08-04 ENCOUNTER — Other Ambulatory Visit (HOSPITAL_BASED_OUTPATIENT_CLINIC_OR_DEPARTMENT_OTHER): Payer: Self-pay

## 2021-08-04 NOTE — Discharge Summary (Signed)
Patient ID: Jeanette Rodriguez MRN: [id number] DOB/AGE: October 21, 1955 66 y.o.  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 female who was admitted 08/02/2021 for operative treatment ofOsteoarthritis of left knee. Patient has severe unremitting pain that affects sleep, daily activities, and work/hobbies. After pre-op clearance the patient was taken to the operating room on 08/02/2021 and underwent  Procedure(s): TOTAL KNEE ARTHROPLASTY.    Patient was given perioperative antibiotics:  Anti-infectives (From admission, onward)    Start     Dose/Rate Route Frequency Ordered Stop   08/02/21 1600  ceFAZolin (ANCEF) IVPB 2g/100 mL premix        2 g 200 mL/hr over 30 Minutes Intravenous Every 6 hours 08/02/21 1237 08/03/21 1542   08/02/21 0815  ceFAZolin (ANCEF) IVPB 2g/100 mL premix        2 g 200 mL/hr over 30 Minutes Intravenous On call to O.R. 08/02/21 0802 08/02/21 0953        Patient was given sequential compression devices, early ambulation, and chemoprophylaxis to prevent DVT.  Patient benefited maximally from hospital stay and there were no complications.    Recent vital signs: Patient Vitals for the past 24 hrs:  BP Temp Temp src Pulse Resp SpO2  08/03/21 1606 (!) 159/87 -- -- 66 -- --  08/03/21 1548 (!) 172/92 (!) 97.5 F (36.4 C) Oral 68 17 98 %     Recent laboratory studies:  Recent Labs    08/03/21 0323  WBC 15.6*  HGB 11.7*  HCT 35.8*  PLT 406*  NA 143   K 3.0*  CL 111  CO2 26  BUN 18  CREATININE 0.75  GLUCOSE 135*  CALCIUM 8.6*     Discharge Medications:   Allergies as of 08/03/2021       Reactions   Levaquin [levofloxacin]    Leg swelling        Medication List     STOP taking these medications    cephALEXin 500 MG capsule Commonly known as: KEFLEX   diazepam 10 MG tablet Commonly known as: VALIUM   meloxicam 15 MG tablet Commonly known as: MOBIC   oxyCODONE-acetaminophen 10-325 MG tablet Commonly known as: Paramedic injection Generic drug: COVID-19 mRNA bivalent vaccine (Pf[employer])   predniSONE 20 MG tablet Commonly known as: DELTASONE       TAKE these medications    aspirin EC 325 MG tablet Take 1 tablet (325 mg total) by mouth 2 (two) times daily for 20 days. Then take one 81 mg aspirin once a day for three weeks. Then discontinue aspirin.   cetirizine 10 MG tablet Commonly known as: ZYRTEC Take 1 tablet by mouth once daily. (1 tablet Orally Once a day 90 days) Notes to patient: Resume home regimen   fluticasone 50 MCG/ACT nasal spray Commonly known as: FLONASE Use 1 spray in each nostril once daily. (1 spray in each nostril Nasally Once  a day 30 day(s)) Notes to patient: Resume home regimen   gabapentin 100 MG capsule Commonly known as: NEURONTIN Take 1 capsule by mouth 4 times daily (1 capsule Orally four times a day 90 days) What changed:  how much to take when to take this   hydrochlorothiazide 25 MG tablet Commonly known as: HYDRODIURIL Take 25 mg by mouth daily.   HYDROmorphone 2 MG tablet Commonly known as: DILAUDID Take 1-2 tablets (2-4 mg total) by mouth every 6 (six) hours as needed for severe pain. Notes to patient: Last dose given 06/13 02:15pm   Magnesium 500 MG Tabs Take 500 mg by mouth daily. Notes to patient: Resume home regimen   meclizine 25 MG tablet Commonly known as: ANTIVERT Take 1 tablet by  mouth every 12 hours as needed. (1  tablet as needed Orally every 12 hrs as needed 30 days) Notes to patient: Resume home regimen   methocarbamol 500 MG tablet Commonly known as: ROBAXIN Take 1 tablet (500 mg total) by mouth every 6 (six) hours as needed for muscle spasms. Notes to patient: Last dose given 06/28pm   montelukast 10 MG tablet Commonly known as: SINGULAIR Take 10 mg by mouth at bedtime. Notes to patient: Resume home regimen   Ozempic (2 MG/DOSE) 8 MG/3ML Sopn Generic drug: Semaglutide (2 MG/DOSE) Inject 2 mg under the skin once weekly. (2MG  Subcutaneous Once a week 90 days) Notes to patient: Resume home regimen   traMADol 50 MG tablet Commonly known as: ULTRAM Take 1-2 tablets (50-100 mg total) by mouth every 6 (six) hours as needed for moderate pain. Notes to patient: Last dose given 06/13 12:15pm               Discharge Care Instructions  (From admission, onward)           Start     Ordered   08/03/21 0000  Weight bearing as tolerated        08/03/21 0735   08/03/21 0000  Change dressing       Comments: You may remove the bulky bandage (ACE wrap and gauze) two days after surgery. You will have an adhesive waterproof bandage underneath. Leave this in place until your first follow-up appointment.   08/03/21 0735            Diagnostic Studies: No results found.  Disposition: Discharge disposition: 01-Home or Self Care       Discharge Instructions     Call MD / Call 911   Complete by: As directed    If you experience chest pain or shortness of breath, CALL 911 and be transported to the hospital emergency room.  If you develope a fever above 101 F, pus (white drainage) or increased drainage or redness at the wound, or calf pain, call your surgeon's office.   Change dressing   Complete by: As directed    You may remove the bulky bandage (ACE wrap and gauze) two days after surgery. You will have an adhesive waterproof bandage underneath. Leave this in place until your first  follow-up appointment.   Constipation Prevention   Complete by: As directed    Drink plenty of fluids.  Prune juice may be helpful.  You may use a stool softener, such as Colace (over the counter) 100 mg twice a day.  Use MiraLax (over the counter) for constipation as needed.   Diet - low sodium heart healthy   Complete by: As directed    Do not put a  pillow under the knee. Place it under the heel.   Complete by: As directed    Driving restrictions   Complete by: As directed    No driving for two weeks   Post-operative opioid taper instructions:   Complete by: As directed    POST-OPERATIVE OPIOID TAPER INSTRUCTIONS: It is important to wean off of your opioid medication as soon as possible. If you do not need pain medication after your surgery it is ok to stop day one. Opioids include: Codeine, Hydrocodone(Norco, Vicodin), Oxycodone(Percocet, oxycontin) and hydromorphone amongst others.  Long term and even short term use of opiods can cause: Increased pain response Dependence Constipation Depression Respiratory depression And more.  Withdrawal symptoms can include Flu like symptoms Nausea, vomiting And more Techniques to manage these symptoms Hydrate well Eat regular healthy meals Stay active Use relaxation techniques(deep breathing, meditating, yoga) Do Not substitute Alcohol to help with tapering If you have been on opioids for less than two weeks and do not have pain than it is ok to stop all together.  Plan to wean off of opioids This plan should start within one week post op of your joint replacement. Maintain the same interval or time between taking each dose and first decrease the dose.  Cut the total daily intake of opioids by one tablet each day Next start to increase the time between doses. The last dose that should be eliminated is the evening dose.      TED hose   Complete by: As directed    Use stockings (TED hose) for three weeks on both leg(s).  You may  remove them at night for sleeping.   Weight bearing as tolerated   Complete by: As directed         Follow-up Information     Gaynelle Arabian, MD. Go on 08/17/2021.   Specialty: Orthopedic Surgery Why: You are scheduled for first post op appointment on Tuesday June 27th at 2:30pm. Contact information: [address] [place] [address] [phone]                  Signed: Theresa Duty 08/04/2021, 2:49 PM

## 2021-08-05 NOTE — Therapy (Signed)
OUTPATIENT PHYSICAL THERAPY LOWER EXTREMITY EVALUATION   Patient Name: Jeanette Rodriguez MRN: [id number] DOB:03-Apr-1955, 66 y.o., female Today's Date: 08/06/2021   PT End of Session - 08/06/21 1014     Visit Number 1    Number of Visits 21    Date for PT Re-Evaluation 10/15/21    Authorization Type MCR    Progress Note Due on Visit 10    PT Start Time 1013    PT Stop Time 1102    PT Time Calculation (min) 49 min    Activity Tolerance Patient tolerated treatment well    Behavior During Therapy WFL for tasks assessed/performed             Past Medical History:  Diagnosis Date   Anxiety    Arthritis    Asthma    Depression    Fibromyalgia    GERD (gastroesophageal reflux disease)    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 [place]   Hypertension    Pneumonia    Past Surgical History:  Procedure Laterality Date   ANKLE SURGERY     x3 right   2007, 2009,2011   TONSILLECTOMY     TOTAL KNEE ARTHROPLASTY Right 08/03/2020   Procedure: TOTAL KNEE ARTHROPLASTY;  Surgeon: Ollen Gross, MD;  Location: [facility];  Service: Orthopedics;  Laterality: Right;    TOTAL KNEE ARTHROPLASTY Left 08/02/2021   Procedure: TOTAL KNEE ARTHROPLASTY;  Surgeon: Ollen Gross, MD;  Location: [facility];  Service: Orthopedics;  Laterality: Left;   Patient Active Problem List   Diagnosis Date Noted   Osteoarthritis of left knee 08/02/2021   Primary osteoarthritis of knee 08/04/2020   OA (osteoarthritis) of knee 08/03/2020    PCP: Mila Palmer, MD  REFERRING PROVIDER: Ollen Gross, MD  REFERRING DIAG: [phone] (ICD-10-CM) - Presence of left artificial knee joint  THERAPY DIAG:  Localized edema  Acute pain of left knee  Stiffness of left knee, not elsewhere classified  Difficulty in walking, not elsewhere classified  Rationale for Evaluation and Treatment Rehabilitation  ONSET DATE: DOS 08/02/21  SUBJECTIVE:   SUBJECTIVE STATEMENT: Arrived in straight leg brace  due to severe pain. Some numbness as expected. Lost 80lb in last year.   PERTINENT HISTORY: none  PAIN:  Are you having pain? Yes: NPRS scale: 8/10 Pain location: Lt knee Pain description: ache, sharp Aggravating factors: constant Relieving factors: rest, meds, ice  PRECAUTIONS: None  WEIGHT BEARING RESTRICTIONS No  FALLS:  Has patient fallen in last 6 months? No  LIVING ENVIRONMENT: Lives with: lives with their family and lives with their spouse Lives in: House/apartment  PLOF: Independent  PATIENT GOALS decrease pain, walk   OBJECTIVE:    PATIENT SURVEYS:  FOTO 26  COGNITION:  Overall cognitive status: Within functional limits for tasks assessed     EDEMA:  Circumferential: 46.5 cm      LOWER EXTREMITY ROM:  Passive ROM Left eval  Knee flexion 56  Knee extension -10  Ankle dorsiflexion   Ankle plantarflexion    (Blank rows = not tested)  LOWER EXTREMITY MMT:  Lacking quad activation on left side   GAIT: Distance walked: into clinic Assistive device utilized:  rollator in clinic Level of assistance: Modified independence Comments: flat foot strike, circumduction swing through    TODAY'S TREATMENT: EVAL Quad set Passive knee flexion Seated heel slide with strap Seated extension stretch Vaso 15 min, low compression, 34 deg, Lt knee in elevation   PATIENT EDUCATION:  Education details: Anatomy of condition, POC, HEP, exercise form/rationale  Person educated: Patient Education method: Explanation, Demonstration, Tactile cues, Verbal cues, and Handouts Education comprehension: verbalized understanding, returned demonstration, verbal cues required, tactile cues required, and needs further education   HOME EXERCISE PROGRAM: [email or web address]  ASSESSMENT:  CLINICAL IMPRESSION: Patient is a 65 y.o. F who was seen today for physical therapy evaluation and treatment for s/p TKA.    OBJECTIVE IMPAIRMENTS Abnormal gait, decreased activity  tolerance, decreased balance, decreased mobility, difficulty walking, decreased ROM, decreased strength, increased edema, increased muscle spasms, impaired flexibility, impaired sensation, improper body mechanics, and pain.   ACTIVITY LIMITATIONS standing, squatting, stairs, transfers, bed mobility, and locomotion level  PARTICIPATION LIMITATIONS: meal prep, cleaning, driving, shopping, and yard work  PERSONAL FACTORS  current vertigo  are also affecting patient's functional outcome.   REHAB POTENTIAL: Good  CLINICAL DECISION MAKING: Stable/uncomplicated  EVALUATION COMPLEXITY: Low   GOALS: Goals reviewed with patient? Yes  SHORT TERM GOALS: Target date: 09/02/2021  ROM 0-120 Baseline: Goal status: INITIAL  2.  Ambulation without AD in household distances Baseline:  Goal status: INITIAL   LONG TERM GOALS: Target date: 10/14/2021   Able to ambulate through daily activities with heel toe gait and without AD Baseline:  Goal status: INITIAL  2.  Pt will return to regular exercise program for long term strength Baseline:  Goal status: INITIAL  3.  Pt will meet FOTO goal Baseline:  Goal status: INITIAL  4.  Able to return to yard work without limitation by knee pain Baseline:  Goal status: INITIAL     PLAN: PT FREQUENCY: 2x/week  PT DURATION: 10 weeks  PLANNED INTERVENTIONS: Therapeutic exercises, Therapeutic activity, Neuromuscular re-education, Balance training, Gait training, Patient/Family education, Joint mobilization, Stair training, Aquatic Therapy, Dry Needling, Electrical stimulation, Cryotherapy, Moist heat, Taping, Manual therapy, and Re-evaluation  PLAN FOR NEXT SESSION: MD f/u 6/27 Continue ROM and gross LE muscle activation  Brisia Schuermann C. Corda Shutt PT, DPT 08/06/21 10:57 AM

## 2021-08-06 ENCOUNTER — Encounter (HOSPITAL_BASED_OUTPATIENT_CLINIC_OR_DEPARTMENT_OTHER): Payer: Self-pay | Admitting: Physical Therapy

## 2021-08-06 ENCOUNTER — Ambulatory Visit (HOSPITAL_BASED_OUTPATIENT_CLINIC_OR_DEPARTMENT_OTHER): Payer: Medicare Other | Attending: Orthopedic Surgery | Admitting: Physical Therapy

## 2021-08-06 DIAGNOSIS — R600 Localized edema: Secondary | ICD-10-CM | POA: Insufficient documentation

## 2021-08-06 DIAGNOSIS — M25562 Pain in left knee: Secondary | ICD-10-CM | POA: Insufficient documentation

## 2021-08-06 DIAGNOSIS — M25662 Stiffness of left knee, not elsewhere classified: Secondary | ICD-10-CM | POA: Insufficient documentation

## 2021-08-06 DIAGNOSIS — R262 Difficulty in walking, not elsewhere classified: Secondary | ICD-10-CM | POA: Insufficient documentation

## 2021-08-09 ENCOUNTER — Other Ambulatory Visit (HOSPITAL_BASED_OUTPATIENT_CLINIC_OR_DEPARTMENT_OTHER): Payer: Self-pay

## 2021-08-09 ENCOUNTER — Ambulatory Visit (HOSPITAL_BASED_OUTPATIENT_CLINIC_OR_DEPARTMENT_OTHER): Payer: Self-pay | Admitting: Physical Therapy

## 2021-08-12 ENCOUNTER — Encounter (HOSPITAL_BASED_OUTPATIENT_CLINIC_OR_DEPARTMENT_OTHER): Payer: Self-pay | Admitting: Physical Therapy

## 2021-08-12 ENCOUNTER — Encounter (HOSPITAL_BASED_OUTPATIENT_CLINIC_OR_DEPARTMENT_OTHER): Payer: Self-pay

## 2021-08-12 ENCOUNTER — Ambulatory Visit (HOSPITAL_BASED_OUTPATIENT_CLINIC_OR_DEPARTMENT_OTHER): Payer: Medicare Other | Admitting: Physical Therapy

## 2021-08-12 DIAGNOSIS — R600 Localized edema: Secondary | ICD-10-CM | POA: Diagnosis not present

## 2021-08-12 DIAGNOSIS — M25662 Stiffness of left knee, not elsewhere classified: Secondary | ICD-10-CM

## 2021-08-12 DIAGNOSIS — M25562 Pain in left knee: Secondary | ICD-10-CM

## 2021-08-12 DIAGNOSIS — R262 Difficulty in walking, not elsewhere classified: Secondary | ICD-10-CM

## 2021-08-12 NOTE — Therapy (Signed)
OUTPATIENT PHYSICAL THERAPY LOWER EXTREMITY EVALUATION   Patient Name: Jeanette Rodriguez MRN: [id number] DOB:29-Dec-1955, 66 y.o., female Today's Date: 08/12/2021   PT End of Session - 08/12/21 1221     Visit Number 2    Number of Visits 21    Date for PT Re-Evaluation 10/15/21    Authorization Type MCR    PT Start Time 0845    PT Stop Time 0937    PT Time Calculation (min) 52 min    Activity Tolerance Patient tolerated treatment well    Behavior During Therapy WFL for tasks assessed/performed              Past Medical History:  Diagnosis Date   Anxiety    Arthritis    Asthma    Depression    Fibromyalgia    GERD (gastroesophageal reflux disease)    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 [place]   Hypertension    Pneumonia    Past Surgical History:  Procedure Laterality Date   ANKLE SURGERY     x3 right   2007, 2009,2011   TONSILLECTOMY     TOTAL KNEE ARTHROPLASTY Right 08/03/2020   Procedure: TOTAL KNEE ARTHROPLASTY;  Surgeon: Ollen Gross, MD;  Location: [facility];  Service: Orthopedics;  Laterality: Right;    TOTAL KNEE ARTHROPLASTY Left 08/02/2021   Procedure: TOTAL KNEE ARTHROPLASTY;  Surgeon: Ollen Gross, MD;  Location: [facility];  Service: Orthopedics;  Laterality: Left;   Patient Active Problem List   Diagnosis Date Noted   Osteoarthritis of left knee 08/02/2021   Primary osteoarthritis of knee 08/04/2020   OA (osteoarthritis) of knee 08/03/2020    PCP: Mila Palmer, MD  REFERRING PROVIDER: Ollen Gross, MD  REFERRING DIAG: [phone] (ICD-10-CM) - Presence of left artificial knee joint  THERAPY DIAG:  Localized edema  Acute pain of left knee  Stiffness of left knee, not elsewhere classified  Difficulty in walking, not elsewhere classified  Rationale for Evaluation and Treatment Rehabilitation  ONSET DATE: DOS 08/02/21  SUBJECTIVE:   SUBJECTIVE STATEMENT: The patient reports her knee has improved.  PERTINENT  HISTORY: none  PAIN:  Are you having pain? Yes: NPRS scale: 8/10 Pain location: Lt knee Pain description: ache, sharp Aggravating factors: constant Relieving factors: rest, meds, ice  PRECAUTIONS: None  WEIGHT BEARING RESTRICTIONS No  FALLS:  Has patient fallen in last 6 months? No  LIVING ENVIRONMENT: Lives with: lives with their family and lives with their spouse Lives in: House/apartment  PLOF: Independent  PATIENT GOALS decrease pain, walk   OBJECTIVE:    PATIENT SURVEYS:  FOTO 26  COGNITION:  Overall cognitive status: Within functional limits for tasks assessed     EDEMA:  Circumferential: 46.5 cm      LOWER EXTREMITY ROM:  Passive ROM Left Eval - 6/16 Left 6/22  Knee flexion 56 85  Knee extension -10 -6  Ankle dorsiflexion    Ankle plantarflexion     (Blank rows = not tested)  LOWER EXTREMITY MMT:  Lacking quad activation on left side   GAIT: Distance walked: into clinic Assistive device utilized:  rollator in clinic Level of assistance: Modified independence Comments: flat foot strike, circumduction swing through    TODAY'S TREATMENT: 6/22 Nu-step 5 min with cuing for ROM  Manual: genetle PROM into flexion and extnesion. Trigger point release to the knee   Quad set 3x10  SAQ x10 ad some pain  SLR 3x5   Standing weight shift  2x10 able to go into her toes   Ice supine with leg raised 10 min      EVAL Quad set Passive knee flexion Seated heel slide with strap Seated extension stretch Vaso 15 min, low compression, 34 deg, Lt knee in elevation   PATIENT EDUCATION:  Education details: Teacher, music of condition, POC, HEP, exercise form/rationale  Person educated: Patient Education method: Explanation, Demonstration, Tactile cues, Verbal cues, and Handouts Education comprehension: verbalized understanding, returned demonstration, verbal cues required, tactile cues required, and needs further education   HOME EXERCISE  PROGRAM: [email or web address]  ASSESSMENT:  CLINICAL IMPRESSION: Patient made a huge jump in her knee flexion today. Her knee extension improved as well. She had some pain with her exercises but no significant increase. Therapy will continue to progress as tolerated.    OBJECTIVE IMPAIRMENTS Abnormal gait, decreased activity tolerance, decreased balance, decreased mobility, difficulty walking, decreased ROM, decreased strength, increased edema, increased muscle spasms, impaired flexibility, impaired sensation, improper body mechanics, and pain.   ACTIVITY LIMITATIONS standing, squatting, stairs, transfers, bed mobility, and locomotion level  PARTICIPATION LIMITATIONS: meal prep, cleaning, driving, shopping, and yard work  PERSONAL FACTORS  current vertigo  are also affecting patient's functional outcome.   REHAB POTENTIAL: Good  CLINICAL DECISION MAKING: Stable/uncomplicated  EVALUATION COMPLEXITY: Low   GOALS: Goals reviewed with patient? Yes  SHORT TERM GOALS: Target date: 09/02/2021  ROM 0-120 Baseline: Goal status: INITIAL  2.  Ambulation without AD in household distances Baseline:  Goal status: INITIAL   LONG TERM GOALS: Target date: 10/14/2021   Able to ambulate through daily activities with heel toe gait and without AD Baseline:  Goal status: INITIAL  2.  Pt will return to regular exercise program for long term strength Baseline:  Goal status: INITIAL  3.  Pt will meet FOTO goal Baseline:  Goal status: INITIAL  4.  Able to return to yard work without limitation by knee pain Baseline:  Goal status: INITIAL     PLAN: PT FREQUENCY: 2x/week  PT DURATION: 10 weeks  PLANNED INTERVENTIONS: Therapeutic exercises, Therapeutic activity, Neuromuscular re-education, Balance training, Gait training, Patient/Family education, Joint mobilization, Stair training, Aquatic Therapy, Dry Needling, Electrical stimulation, Cryotherapy, Moist heat, Taping, Manual therapy, and  Re-evaluation  PLAN FOR NEXT SESSION: MD f/u 6/27 Continue ROM and gross LE muscle activation  Jessica C. Hightower PT, DPT 08/12/21 1:02 PM

## 2021-08-16 ENCOUNTER — Other Ambulatory Visit (HOSPITAL_BASED_OUTPATIENT_CLINIC_OR_DEPARTMENT_OTHER): Payer: Self-pay

## 2021-08-16 MED ORDER — OXYCODONE-ACETAMINOPHEN 10-325 MG PO TABS
1.0000 | ORAL_TABLET | Freq: Three times a day (TID) | ORAL | 0 refills | Status: DC | PRN
  Filled 2021-08-16: qty 90, 30d supply, fill #0

## 2021-08-17 ENCOUNTER — Ambulatory Visit (HOSPITAL_BASED_OUTPATIENT_CLINIC_OR_DEPARTMENT_OTHER): Payer: Medicare Other | Admitting: Physical Therapy

## 2021-08-17 ENCOUNTER — Encounter (HOSPITAL_BASED_OUTPATIENT_CLINIC_OR_DEPARTMENT_OTHER): Payer: Self-pay | Admitting: Physical Therapy

## 2021-08-17 ENCOUNTER — Other Ambulatory Visit (HOSPITAL_BASED_OUTPATIENT_CLINIC_OR_DEPARTMENT_OTHER): Payer: Self-pay

## 2021-08-17 DIAGNOSIS — M25562 Pain in left knee: Secondary | ICD-10-CM

## 2021-08-17 DIAGNOSIS — M25662 Stiffness of left knee, not elsewhere classified: Secondary | ICD-10-CM

## 2021-08-17 DIAGNOSIS — R600 Localized edema: Secondary | ICD-10-CM

## 2021-08-17 DIAGNOSIS — R262 Difficulty in walking, not elsewhere classified: Secondary | ICD-10-CM

## 2021-08-17 MED ORDER — METHOCARBAMOL 500 MG PO TABS
ORAL_TABLET | ORAL | 0 refills | Status: DC
  Filled 2021-08-17: qty 40, 10d supply, fill #0

## 2021-08-17 MED ORDER — HYDROCHLOROTHIAZIDE 25 MG PO TABS
ORAL_TABLET | ORAL | 0 refills | Status: DC
  Filled 2021-08-17: qty 90, 90d supply, fill #0

## 2021-08-18 ENCOUNTER — Other Ambulatory Visit (HOSPITAL_BASED_OUTPATIENT_CLINIC_OR_DEPARTMENT_OTHER): Payer: Self-pay

## 2021-08-19 ENCOUNTER — Encounter (HOSPITAL_BASED_OUTPATIENT_CLINIC_OR_DEPARTMENT_OTHER): Payer: Self-pay | Admitting: Physical Therapy

## 2021-08-19 ENCOUNTER — Ambulatory Visit (HOSPITAL_BASED_OUTPATIENT_CLINIC_OR_DEPARTMENT_OTHER): Payer: Medicare Other | Admitting: Physical Therapy

## 2021-08-19 DIAGNOSIS — R600 Localized edema: Secondary | ICD-10-CM | POA: Diagnosis not present

## 2021-08-19 DIAGNOSIS — M25562 Pain in left knee: Secondary | ICD-10-CM

## 2021-08-19 DIAGNOSIS — M25662 Stiffness of left knee, not elsewhere classified: Secondary | ICD-10-CM

## 2021-08-19 DIAGNOSIS — R262 Difficulty in walking, not elsewhere classified: Secondary | ICD-10-CM

## 2021-08-19 NOTE — Therapy (Signed)
OUTPATIENT PHYSICAL THERAPY LOWER EXTREMITY EVALUATION   Patient Name: Jeanette Rodriguez MRN: [id number] DOB:[birth year], 66 y.o., female Today's Date: 08/19/2021   PT End of Session - 08/19/21 1527     Visit Number 4    Number of Visits 21    Date for PT Re-Evaluation 10/15/21    Authorization Type MCR    Progress Note Due on Visit 10    PT Start Time 1516    PT Stop Time 1557    PT Time Calculation (min) 41 min    Activity Tolerance Patient tolerated treatment well    Behavior During Therapy WFL for tasks assessed/performed               Past Medical History:  Diagnosis Date   Anxiety    Arthritis    Asthma    Depression    Fibromyalgia    GERD (gastroesophageal reflux disease)    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 [place]   Hypertension    Pneumonia    Past Surgical History:  Procedure Laterality Date   ANKLE SURGERY     x3 right   2007, 2009,2011   TONSILLECTOMY     TOTAL KNEE ARTHROPLASTY Right 08/03/2020   Procedure: TOTAL KNEE ARTHROPLASTY;  Surgeon: Ollen Gross, MD;  Location: [facility];  Service: Orthopedics;  Laterality: Right;    TOTAL KNEE ARTHROPLASTY Left 08/02/2021   Procedure: TOTAL KNEE ARTHROPLASTY;  Surgeon: Ollen Gross, MD;  Location: [facility];  Service: Orthopedics;  Laterality: Left;   Patient Active Problem List   Diagnosis Date Noted   Osteoarthritis of left knee 08/02/2021   Primary osteoarthritis of knee 08/04/2020   OA (osteoarthritis) of knee 08/03/2020    PCP: Mila Palmer, MD  REFERRING PROVIDER: Ollen Gross, MD  REFERRING DIAG: [phone] (ICD-10-CM) - Presence of left artificial knee joint  THERAPY DIAG:  Localized edema  Acute pain of left knee  Stiffness of left knee, not elsewhere classified  Difficulty in walking, not elsewhere classified  Rationale for Evaluation and Treatment Rehabilitation  ONSET DATE: DOS 08/02/21  SUBJECTIVE:   SUBJECTIVE STATEMENT: I did yard work today,  iced for about an hour and took pain meds and now I am ready to go. I am pretty happy with it.   PERTINENT HISTORY: none  PAIN:  Are you having pain? Yes: NPRS scale: 5/10 Pain location: Lt knee Pain description: ache, sharp Aggravating factors: constant Relieving factors: rest, meds, ice  PRECAUTIONS: None  WEIGHT BEARING RESTRICTIONS No  FALLS:  Has patient fallen in last 6 months? No  LIVING ENVIRONMENT: Lives with: lives with their family and lives with their spouse Lives in: House/apartment  PLOF: Independent  PATIENT GOALS decrease pain, walk   OBJECTIVE:    PATIENT SURVEYS:  FOTO 26  COGNITION:  Overall cognitive status: Within functional limits for tasks assessed     EDEMA:  Circumferential: 46.5 cm      LOWER EXTREMITY ROM:  Passive ROM Left Eval - 6/16 Left 6/22  Knee flexion 56 85  Knee extension -10 -6  Ankle dorsiflexion    Ankle plantarflexion     (Blank rows = not tested)  LOWER EXTREMITY MMT:  Lacking quad activation on left side   GAIT: Distance walked: into clinic Assistive device utilized:  rollator in clinic Level of assistance: Modified independence Comments: flat foot strike, circumduction swing through    TODAY'S TREATMENT: 6/29 Nu step 5 min L3 UE & LE Gait-  toe off+knee flexion in swing through Seated, alt LAQ 10 each Supine quad set + SLR with education on reducing quad lag Heel slide with strap- flexion to  96 deg today  Sidelying hip abd- progressed to circles Standing weight shift to midline- tends to favor Rt Mini squats at midline   6/27 Nu-step 5 min with cuing for ROM  Manual: genetle PROM into flexion and extnesion. TrP manual release of the quad and hamstrings  Quad set 3x10  SAQ x10 3x10 SLR 3x10  PF/DF weight shift x10 Standing marches x10 small range  Ice supine with leg raised 10 min    6/22 Nu-step 5 min with cuing for ROM  Manual: genetle PROM into flexion and extnesion. Trigger  point release to the knee   Quad set 3x10  SAQ x10 ad some pain  SLR 3x5   Standing weight shift 2x10 able to go into her toes Ice supine with leg raised 10 min    PATIENT EDUCATION:  Education details: Teacher, music of condition, POC, HEP, exercise form/rationale  Person educated: Patient Education method: Explanation, Demonstration, Tactile cues, Verbal cues, and Handouts Education comprehension: verbalized understanding, returned demonstration, verbal cues required, tactile cues required, and needs further education   HOME EXERCISE PROGRAM: [email or web address]  ASSESSMENT:  CLINICAL IMPRESSION: Approx 5 deg of quad lag in SLR and pt is experiencing some buckling in walking. Able to demo proper toe off to swing through with knee flexion with education.    OBJECTIVE IMPAIRMENTS Abnormal gait, decreased activity tolerance, decreased balance, decreased mobility, difficulty walking, decreased ROM, decreased strength, increased edema, increased muscle spasms, impaired flexibility, impaired sensation, improper body mechanics, and pain.   ACTIVITY LIMITATIONS standing, squatting, stairs, transfers, bed mobility, and locomotion level  PARTICIPATION LIMITATIONS: meal prep, cleaning, driving, shopping, and yard work  PERSONAL FACTORS  current vertigo  are also affecting patient's functional outcome.   REHAB POTENTIAL: Good  CLINICAL DECISION MAKING: Stable/uncomplicated  EVALUATION COMPLEXITY: Low   GOALS: Goals reviewed with patient? Yes  SHORT TERM GOALS: Target date: 09/02/2021  ROM 0-120 Baseline: Goal status: INITIAL  2.  Ambulation without AD in household distances Baseline:  Goal status: INITIAL   LONG TERM GOALS: Target date: 10/14/2021   Able to ambulate through daily activities with heel toe gait and without AD Baseline:  Goal status: INITIAL  2.  Pt will return to regular exercise program for long term strength Baseline:  Goal status: INITIAL  3.  Pt will meet FOTO  goal Baseline:  Goal status: INITIAL  4.  Able to return to yard work without limitation by knee pain Baseline:  Goal status: INITIAL     PLAN: PT FREQUENCY: 2x/week  PT DURATION: 10 weeks  PLANNED INTERVENTIONS: Therapeutic exercises, Therapeutic activity, Neuromuscular re-education, Balance training, Gait training, Patient/Family education, Joint mobilization, Stair training, Aquatic Therapy, Dry Needling, Electrical stimulation, Cryotherapy, Moist heat, Taping, Manual therapy, and Re-evaluation  PLAN FOR NEXT SESSION: cont CKC midline quad activation, review gait  Odessia Asleson C. Deangela Randleman PT, DPT 08/19/21 3:57 PM

## 2021-08-23 ENCOUNTER — Encounter (HOSPITAL_BASED_OUTPATIENT_CLINIC_OR_DEPARTMENT_OTHER): Payer: Self-pay

## 2021-08-23 ENCOUNTER — Ambulatory Visit (HOSPITAL_BASED_OUTPATIENT_CLINIC_OR_DEPARTMENT_OTHER): Payer: Medicare Other | Admitting: Physical Therapy

## 2021-08-26 ENCOUNTER — Ambulatory Visit (HOSPITAL_BASED_OUTPATIENT_CLINIC_OR_DEPARTMENT_OTHER): Payer: Medicare Other | Attending: Orthopedic Surgery | Admitting: Physical Therapy

## 2021-08-26 ENCOUNTER — Other Ambulatory Visit (HOSPITAL_BASED_OUTPATIENT_CLINIC_OR_DEPARTMENT_OTHER): Payer: Self-pay

## 2021-08-26 DIAGNOSIS — M25662 Stiffness of left knee, not elsewhere classified: Secondary | ICD-10-CM | POA: Diagnosis present

## 2021-08-26 DIAGNOSIS — R600 Localized edema: Secondary | ICD-10-CM | POA: Insufficient documentation

## 2021-08-26 DIAGNOSIS — R262 Difficulty in walking, not elsewhere classified: Secondary | ICD-10-CM | POA: Diagnosis present

## 2021-08-26 DIAGNOSIS — M25562 Pain in left knee: Secondary | ICD-10-CM | POA: Diagnosis present

## 2021-08-26 NOTE — Therapy (Signed)
OUTPATIENT PHYSICAL THERAPY LOWER EXTREMITY EVALUATION   Patient Name: Jeanette Rodriguez MRN: [id number] DOB:06-21-1955, 66 y.o., female Today's Date: 08/27/2021   PT End of Session - 08/27/21 1204     Visit Number 5    Number of Visits 21    Date for PT Re-Evaluation 10/15/21    Authorization Type MCR    Progress Note Due on Visit 10    PT Start Time 1433    PT Stop Time 1514    PT Time Calculation (min) 41 min    Activity Tolerance Patient tolerated treatment well    Behavior During Therapy WFL for tasks assessed/performed                Past Medical History:  Diagnosis Date   Anxiety    Arthritis    Asthma    Depression    Fibromyalgia    GERD (gastroesophageal reflux disease)    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 [place]   Hypertension    Pneumonia    Past Surgical History:  Procedure Laterality Date   ANKLE SURGERY     x3 right   2007, 2009,2011   TONSILLECTOMY     TOTAL KNEE ARTHROPLASTY Right 08/03/2020   Procedure: TOTAL KNEE ARTHROPLASTY;  Surgeon: Ollen Gross, MD;  Location: [facility];  Service: Orthopedics;  Laterality: Right;    TOTAL KNEE ARTHROPLASTY Left 08/02/2021   Procedure: TOTAL KNEE ARTHROPLASTY;  Surgeon: Ollen Gross, MD;  Location: [facility];  Service: Orthopedics;  Laterality: Left;   Patient Active Problem List   Diagnosis Date Noted   Osteoarthritis of left knee 08/02/2021   Primary osteoarthritis of knee 08/04/2020   OA (osteoarthritis) of knee 08/03/2020    PCP: Mila Palmer, MD  REFERRING PROVIDER: Ollen Gross, MD  REFERRING DIAG: [phone] (ICD-10-CM) - Presence of left artificial knee joint  THERAPY DIAG:  Localized edema  Acute pain of left knee  Stiffness of left knee, not elsewhere classified  Difficulty in walking, not elsewhere classified  Rationale for Evaluation and Treatment Rehabilitation  ONSET DATE: DOS 08/02/21  SUBJECTIVE:   SUBJECTIVE STATEMENT: I lifted our new  washer/dryer and didn't think about the knee and bent it as I squat down.   PERTINENT HISTORY: none  PAIN:  Are you having pain? Yes: NPRS scale: mild/10 Pain location: Lt knee Pain description: ache, sharp Aggravating factors: constant Relieving factors: rest, meds, ice  PRECAUTIONS: None  WEIGHT BEARING RESTRICTIONS No  FALLS:  Has patient fallen in last 6 months? No  LIVING ENVIRONMENT: Lives with: lives with their family and lives with their spouse Lives in: House/apartment  PLOF: Independent  PATIENT GOALS decrease pain, walk   OBJECTIVE:    PATIENT SURVEYS:  FOTO 26  COGNITION:  Overall cognitive status: Within functional limits for tasks assessed     EDEMA:  Circumferential: 46.5 cm      LOWER EXTREMITY ROM:  Passive ROM Left Eval - 6/16 Left 6/22 LEFT 7/6  Knee flexion 56 85 92  Knee extension -10 -6 0  Ankle dorsiflexion     Ankle plantarflexion      (Blank rows = not tested)  LOWER EXTREMITY MMT:  Lacking quad activation on left side   GAIT: 7/5- no AD, mild antalgic gait but able to reduce with cues to avoid circumduction    TODAY'S TREATMENT:  08/26/21 Nu step 5 min L3 Ue & LE Seated HSS Supine SLR to avoid quad lag Sidelying diagonal  hp abd Heel slide with strap Alt cone taps with chair to left for balance support Fwd step ups  6/29 Nu step 5 min L3 UE & LE Gait- toe off+knee flexion in swing through Seated, alt LAQ 10 each Supine quad set + SLR with education on reducing quad lag Heel slide with strap- flexion to  96 deg today  Sidelying hip abd- progressed to circles Standing weight shift to midline- tends to favor Rt Mini squats at midline      PATIENT EDUCATION:  Education details: Anatomy of condition, POC, HEP, exercise form/rationale  Person educated: Patient Education method: Explanation, Demonstration, Tactile cues, Verbal cues, and Handouts Education comprehension: verbalized understanding, returned  demonstration, verbal cues required, tactile cues required, and needs further education   HOME EXERCISE PROGRAM: [email or web address]  ASSESSMENT:  CLINICAL IMPRESSION: Was able to perform step up with proper form with cues and will work on this at home.    OBJECTIVE IMPAIRMENTS Abnormal gait, decreased activity tolerance, decreased balance, decreased mobility, difficulty walking, decreased ROM, decreased strength, increased edema, increased muscle spasms, impaired flexibility, impaired sensation, improper body mechanics, and pain.   ACTIVITY LIMITATIONS standing, squatting, stairs, transfers, bed mobility, and locomotion level  PARTICIPATION LIMITATIONS: meal prep, cleaning, driving, shopping, and yard work  PERSONAL FACTORS  current vertigo  are also affecting patient's functional outcome.   REHAB POTENTIAL: Good  CLINICAL DECISION MAKING: Stable/uncomplicated  EVALUATION COMPLEXITY: Low   GOALS: Goals reviewed with patient? Yes  SHORT TERM GOALS: Target date: 09/02/2021  ROM 0-120 Baseline: Goal status: INITIAL  2.  Ambulation without AD in household distances Baseline:  Goal status: INITIAL   LONG TERM GOALS: Target date: 10/14/2021   Able to ambulate through daily activities with heel toe gait and without AD Baseline:  Goal status: INITIAL  2.  Pt will return to regular exercise program for long term strength Baseline:  Goal status: INITIAL  3.  Pt will meet FOTO goal Baseline:  Goal status: INITIAL  4.  Able to return to yard work without limitation by knee pain Baseline:  Goal status: INITIAL     PLAN: PT FREQUENCY: 2x/week  PT DURATION: 10 weeks  PLANNED INTERVENTIONS: Therapeutic exercises, Therapeutic activity, Neuromuscular re-education, Balance training, Gait training, Patient/Family education, Joint mobilization, Stair training, Aquatic Therapy, Dry Needling, Electrical stimulation, Cryotherapy, Moist heat, Taping, Manual therapy, and  Re-evaluation  PLAN FOR NEXT SESSION: cont CKC midline quad activation, review gait, step down form  Padraig Nhan C. Lynore Coscia PT, DPT 08/27/21 12:07 PM

## 2021-08-27 ENCOUNTER — Encounter (HOSPITAL_BASED_OUTPATIENT_CLINIC_OR_DEPARTMENT_OTHER): Payer: Self-pay | Admitting: Physical Therapy

## 2021-08-30 ENCOUNTER — Ambulatory Visit (HOSPITAL_BASED_OUTPATIENT_CLINIC_OR_DEPARTMENT_OTHER): Payer: Medicare Other | Admitting: Physical Therapy

## 2021-08-30 ENCOUNTER — Encounter (HOSPITAL_BASED_OUTPATIENT_CLINIC_OR_DEPARTMENT_OTHER): Payer: Self-pay | Admitting: Physical Therapy

## 2021-08-30 DIAGNOSIS — R262 Difficulty in walking, not elsewhere classified: Secondary | ICD-10-CM

## 2021-08-30 DIAGNOSIS — M25562 Pain in left knee: Secondary | ICD-10-CM

## 2021-08-30 DIAGNOSIS — R600 Localized edema: Secondary | ICD-10-CM

## 2021-08-30 DIAGNOSIS — M25662 Stiffness of left knee, not elsewhere classified: Secondary | ICD-10-CM

## 2021-08-30 NOTE — Therapy (Signed)
OUTPATIENT PHYSICAL THERAPY LOWER EXTREMITY EVALUATION   Patient Name: Jeanette Rodriguez MRN: [id number] DOB:August 24, 1955, 66 y.o., female Today's Date: 08/30/2021   PT End of Session - 08/30/21 1433     Visit Number 6    Number of Visits 21    Date for PT Re-Evaluation 10/15/21    Authorization Type MCR    Progress Note Due on Visit 10    PT Start Time 1430    PT Stop Time 1515    PT Time Calculation (min) 45 min    Activity Tolerance Patient tolerated treatment well    Behavior During Therapy WFL for tasks assessed/performed                Past Medical History:  Diagnosis Date   Anxiety    Arthritis    Asthma    Depression    Fibromyalgia    GERD (gastroesophageal reflux disease)    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 [place]   Hypertension    Pneumonia    Past Surgical History:  Procedure Laterality Date   ANKLE SURGERY     x3 right   2007, 2009,2011   TONSILLECTOMY     TOTAL KNEE ARTHROPLASTY Right 08/03/2020   Procedure: TOTAL KNEE ARTHROPLASTY;  Surgeon: Ollen Gross, MD;  Location: [facility];  Service: Orthopedics;  Laterality: Right;    TOTAL KNEE ARTHROPLASTY Left 08/02/2021   Procedure: TOTAL KNEE ARTHROPLASTY;  Surgeon: Ollen Gross, MD;  Location: [facility];  Service: Orthopedics;  Laterality: Left;   Patient Active Problem List   Diagnosis Date Noted   Osteoarthritis of left knee 08/02/2021   Primary osteoarthritis of knee 08/04/2020   OA (osteoarthritis) of knee 08/03/2020    PCP: Mila Palmer, MD  REFERRING PROVIDER: Ollen Gross, MD  REFERRING DIAG: [phone] (ICD-10-CM) - Presence of left artificial knee joint  THERAPY DIAG:  Localized edema  Acute pain of left knee  Stiffness of left knee, not elsewhere classified  Difficulty in walking, not elsewhere classified  Rationale for Evaluation and Treatment Rehabilitation  ONSET DATE: DOS 08/02/21  SUBJECTIVE:   SUBJECTIVE STATEMENT: Poor weather over the  weekend made my whole body hurt. I did some exercises but not all of them. Did well going down stairs at home but going up had to use step to for ascending.   PERTINENT HISTORY: none  PAIN:  Are you having pain? Yes: NPRS scale: 6/10 Pain location: Lt knee Pain description: ache, sharp Aggravating factors: constant Relieving factors: rest, meds, ice  PRECAUTIONS: None  WEIGHT BEARING RESTRICTIONS No  FALLS:  Has patient fallen in last 6 months? No  LIVING ENVIRONMENT: Lives with: lives with their family and lives with their spouse Lives in: House/apartment  PLOF: Independent  PATIENT GOALS decrease pain, walk   OBJECTIVE:    PATIENT SURVEYS:  FOTO 26  COGNITION:  Overall cognitive status: Within functional limits for tasks assessed     EDEMA:  Circumferential: 46.5 cm      LOWER EXTREMITY ROM:  Passive ROM Left Eval - 6/16 Left 6/22 LEFT 7/6 LEFT 7/10  Knee flexion 56 85 92 100  Knee extension -10 -6 0 0  Ankle dorsiflexion      Ankle plantarflexion       (Blank rows = not tested)  LOWER EXTREMITY MMT:  Lacking quad activation on left side   GAIT: 7/5- no AD, mild antalgic gait but able to reduce with cues to avoid circumduction  TODAY'S TREATMENT:  08/30/21 Nu step 5 min L3 LE only MANUAL: edema mob around knee, scar mobilization, passive flexion- supine & seated with distraction SLR- alternating for awareness, neutral & in ER Theract: stair training Heel raises Gastroc stretch standing   08/26/21 Nu step 5 min L3 Ue & LE Seated HSS Supine SLR to avoid quad lag Sidelying diagonal hp abd Heel slide with strap Alt cone taps with chair to left for balance support Fwd step ups  6/29 Nu step 5 min L3 UE & LE Gait- toe off+knee flexion in swing through Seated, alt LAQ 10 each Supine quad set + SLR with education on reducing quad lag Heel slide with strap- flexion to  96 deg today  Sidelying hip abd- progressed to  circles Standing weight shift to midline- tends to favor Rt Mini squats at midline      PATIENT EDUCATION:  Education details: Anatomy of condition, POC, HEP, exercise form/rationale  Person educated: Patient Education method: Explanation, Demonstration, Tactile cues, Verbal cues, and Handouts Education comprehension: verbalized understanding, returned demonstration, verbal cues required, tactile cues required, and needs further education   HOME EXERCISE PROGRAM: [email or web address]  ASSESSMENT:  CLINICAL IMPRESSION: Edema mobilization decr sensation of tightness in  knee.    OBJECTIVE IMPAIRMENTS Abnormal gait, decreased activity tolerance, decreased balance, decreased mobility, difficulty walking, decreased ROM, decreased strength, increased edema, increased muscle spasms, impaired flexibility, impaired sensation, improper body mechanics, and pain.   ACTIVITY LIMITATIONS standing, squatting, stairs, transfers, bed mobility, and locomotion level  PARTICIPATION LIMITATIONS: meal prep, cleaning, driving, shopping, and yard work  PERSONAL FACTORS  current vertigo  are also affecting patient's functional outcome.   REHAB POTENTIAL: Good  CLINICAL DECISION MAKING: Stable/uncomplicated  EVALUATION COMPLEXITY: Low   GOALS: Goals reviewed with patient? Yes  SHORT TERM GOALS: Target date: 09/02/2021  ROM 0-120 Baseline: Goal status: INITIAL  2.  Ambulation without AD in household distances Baseline:  Goal status: achieved   LONG TERM GOALS: Target date: 10/14/2021   Able to ambulate through daily activities with heel toe gait and without AD Baseline:  Goal status: INITIAL  2.  Pt will return to regular exercise program for long term strength Baseline:  Goal status: INITIAL  3.  Pt will meet FOTO goal Baseline:  Goal status: INITIAL  4.  Able to return to yard work without limitation by knee pain Baseline:  Goal status: INITIAL     PLAN: PT FREQUENCY:  2x/week  PT DURATION: 10 weeks  PLANNED INTERVENTIONS: Therapeutic exercises, Therapeutic activity, Neuromuscular re-education, Balance training, Gait training, Patient/Family education, Joint mobilization, Stair training, Aquatic Therapy, Dry Needling, Electrical stimulation, Cryotherapy, Moist heat, Taping, Manual therapy, and Re-evaluation  PLAN FOR NEXT SESSION: cont CKC midline quad activation, review gait, step down form  Price Lachapelle C. Tiesha Marich PT, DPT 08/30/21 3:21 PM

## 2021-08-31 ENCOUNTER — Other Ambulatory Visit (HOSPITAL_BASED_OUTPATIENT_CLINIC_OR_DEPARTMENT_OTHER): Payer: Self-pay

## 2021-08-31 ENCOUNTER — Other Ambulatory Visit (HOSPITAL_COMMUNITY): Payer: Federal, State, Local not specified - PPO

## 2021-08-31 MED ORDER — METHOCARBAMOL 500 MG PO TABS
ORAL_TABLET | ORAL | 0 refills | Status: DC
  Filled 2021-08-31: qty 40, 10d supply, fill #0

## 2021-09-02 ENCOUNTER — Ambulatory Visit (HOSPITAL_BASED_OUTPATIENT_CLINIC_OR_DEPARTMENT_OTHER): Payer: Medicare Other | Admitting: Physical Therapy

## 2021-09-02 ENCOUNTER — Encounter (HOSPITAL_BASED_OUTPATIENT_CLINIC_OR_DEPARTMENT_OTHER): Payer: Self-pay | Admitting: Physical Therapy

## 2021-09-02 DIAGNOSIS — R262 Difficulty in walking, not elsewhere classified: Secondary | ICD-10-CM

## 2021-09-02 DIAGNOSIS — M25562 Pain in left knee: Secondary | ICD-10-CM

## 2021-09-02 DIAGNOSIS — M25662 Stiffness of left knee, not elsewhere classified: Secondary | ICD-10-CM

## 2021-09-02 DIAGNOSIS — R600 Localized edema: Secondary | ICD-10-CM | POA: Diagnosis not present

## 2021-09-02 NOTE — Therapy (Signed)
OUTPATIENT PHYSICAL THERAPY LOWER EXTREMITY EVALUATION   Patient Name: Jeanette Rodriguez MRN: [id number] DOB:09-Jan-1956, 66 y.o., female Today's Date: 09/02/2021   PT End of Session - 09/02/21 1458     Visit Number 7    Number of Visits 21    Date for PT Re-Evaluation 10/15/21    Authorization Type MCR    PT Start Time 1430    PT Stop Time 1512    PT Time Calculation (min) 42 min    Activity Tolerance Patient tolerated treatment well    Behavior During Therapy WFL for tasks assessed/performed                 Past Medical History:  Diagnosis Date   Anxiety    Arthritis    Asthma    Depression    Fibromyalgia    GERD (gastroesophageal reflux disease)    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 [place]   Hypertension    Pneumonia    Past Surgical History:  Procedure Laterality Date   ANKLE SURGERY     x3 right   2007, 2009,2011   TONSILLECTOMY     TOTAL KNEE ARTHROPLASTY Right 08/03/2020   Procedure: TOTAL KNEE ARTHROPLASTY;  Surgeon: Ollen Gross, MD;  Location: [facility];  Service: Orthopedics;  Laterality: Right;    TOTAL KNEE ARTHROPLASTY Left 08/02/2021   Procedure: TOTAL KNEE ARTHROPLASTY;  Surgeon: Ollen Gross, MD;  Location: [facility];  Service: Orthopedics;  Laterality: Left;   Patient Active Problem List   Diagnosis Date Noted   Osteoarthritis of left knee 08/02/2021   Primary osteoarthritis of knee 08/04/2020   OA (osteoarthritis) of knee 08/03/2020    PCP: Mila Palmer, MD  REFERRING PROVIDER: Ollen Gross, MD  REFERRING DIAG: [phone] (ICD-10-CM) - Presence of left artificial knee joint  THERAPY DIAG:  Localized edema  Acute pain of left knee  Stiffness of left knee, not elsewhere classified  Difficulty in walking, not elsewhere classified  Rationale for Evaluation and Treatment Rehabilitation  ONSET DATE: DOS 08/02/21  SUBJECTIVE:   SUBJECTIVE STATEMENT:  PERTINENT HISTORY: none  PAIN:  Are you having  pain? Yes: NPRS scale: 6/10 09/02/2021 Pain location: Lt knee Pain description: ache, sharp Aggravating factors: constant Relieving factors: rest, meds, ice  PRECAUTIONS: None  WEIGHT BEARING RESTRICTIONS No  FALLS:  Has patient fallen in last 6 months? No  LIVING ENVIRONMENT: Lives with: lives with their family and lives with their spouse Lives in: House/apartment  PLOF: Independent  PATIENT GOALS decrease pain, walk   OBJECTIVE:    PATIENT SURVEYS:  FOTO 26  COGNITION:  Overall cognitive status: Within functional limits for tasks assessed     EDEMA:  Circumferential: 46.5 cm      LOWER EXTREMITY ROM:  Passive ROM Left Eval - 6/16 Left 6/22 LEFT 7/6 LEFT 7/10  Knee flexion 56 85 92 100  Knee extension -10 -6 0 0  Ankle dorsiflexion      Ankle plantarflexion       (Blank rows = not tested)  LOWER EXTREMITY MMT:  Lacking quad activation on left side   GAIT: 7/5- no AD, mild antalgic gait but able to reduce with cues to avoid circumduction    TODAY'S TREATMENT:  09/02/21 Nu step 5 min L3 LE only MANUAL: edema mob around knee, scar mobilization, passive flexion- supine & seated with distraction SLR 2x10  Mini squats x20  Side-stepping 4 laps    08/30/21 Nu step 5  min L3 LE only MANUAL: edema mob around knee, scar mobilization, passive flexion- supine & seated with distraction SLR- alternating for awareness, neutral & in ER Theract: stair training Heel raises Gastroc stretch standing  08/26/21 Nu step 5 min L3 Ue & LE Seated HSS Supine SLR to avoid quad lag Sidelying diagonal hp abd Heel slide with strap Alt cone taps with chair to left for balance support Fwd step ups  6/29 Nu step 5 min L3 UE & LE Gait- toe off+knee flexion in swing through Seated, alt LAQ 10 each Supine quad set + SLR with education on reducing quad lag Heel slide with strap- flexion to  96 deg today  Sidelying hip abd- progressed to circles Standing weight  shift to midline- tends to favor Rt Mini squats at midline      PATIENT EDUCATION:  Education details: Anatomy of condition, POC, HEP, exercise form/rationale  Person educated: Patient Education method: Explanation, Demonstration, Tactile cues, Verbal cues, and Handouts Education comprehension: verbalized understanding, returned demonstration, verbal cues required, tactile cues required, and needs further education   HOME EXERCISE PROGRAM: [email or web address]  ASSESSMENT:  CLINICAL IMPRESSION: The patient is making goo dprogress. She tolerated exercises well. Therapy had her perform side stepping with a band she tolerated well. She noted some scar tissue around the most distal portion of the scar. She was shown how to work on it. She is squatting well at this time. She came in today without and AD. We will continue to progress functional strengthening as tolerated.    OBJECTIVE IMPAIRMENTS Abnormal gait, decreased activity tolerance, decreased balance, decreased mobility, difficulty walking, decreased ROM, decreased strength, increased edema, increased muscle spasms, impaired flexibility, impaired sensation, improper body mechanics, and pain.   ACTIVITY LIMITATIONS standing, squatting, stairs, transfers, bed mobility, and locomotion level  PARTICIPATION LIMITATIONS: meal prep, cleaning, driving, shopping, and yard work  PERSONAL FACTORS  current vertigo  are also affecting patient's functional outcome.   REHAB POTENTIAL: Good  CLINICAL DECISION MAKING: Stable/uncomplicated  EVALUATION COMPLEXITY: Low   GOALS: Goals reviewed with patient? Yes  SHORT TERM GOALS: Target date: 09/02/2021  ROM 0-120 Baseline: Goal status: INITIAL  2.  Ambulation without AD in household distances Baseline:  Goal status: achieved   LONG TERM GOALS: Target date: 10/14/2021   Able to ambulate through daily activities with heel toe gait and without AD Baseline:  Goal status: INITIAL  2.  Pt will  return to regular exercise program for long term strength Baseline:  Goal status: INITIAL  3.  Pt will meet FOTO goal Baseline:  Goal status: INITIAL  4.  Able to return to yard work without limitation by knee pain Baseline:  Goal status: INITIAL     PLAN: PT FREQUENCY: 2x/week  PT DURATION: 10 weeks  PLANNED INTERVENTIONS: Therapeutic exercises, Therapeutic activity, Neuromuscular re-education, Balance training, Gait training, Patient/Family education, Joint mobilization, Stair training, Aquatic Therapy, Dry Needling, Electrical stimulation, Cryotherapy, Moist heat, Taping, Manual therapy, and Re-evaluation  PLAN FOR NEXT SESSION: cont CKC midline quad activation, review gait, step down form  Lorayne Bender PT DPT  09/02/21 3:06 PM  Mathew Hamlet SPT 09/02/2021   During this treatment session, the therapist was present, participating in and directing the treatment.

## 2021-09-10 ENCOUNTER — Other Ambulatory Visit (HOSPITAL_BASED_OUTPATIENT_CLINIC_OR_DEPARTMENT_OTHER): Payer: Self-pay

## 2021-09-15 ENCOUNTER — Ambulatory Visit (HOSPITAL_BASED_OUTPATIENT_CLINIC_OR_DEPARTMENT_OTHER): Payer: Medicare Other | Admitting: Physical Therapy

## 2021-09-15 ENCOUNTER — Encounter (HOSPITAL_BASED_OUTPATIENT_CLINIC_OR_DEPARTMENT_OTHER): Payer: Self-pay | Admitting: Physical Therapy

## 2021-09-15 DIAGNOSIS — M25562 Pain in left knee: Secondary | ICD-10-CM

## 2021-09-15 DIAGNOSIS — R600 Localized edema: Secondary | ICD-10-CM | POA: Diagnosis not present

## 2021-09-15 DIAGNOSIS — R262 Difficulty in walking, not elsewhere classified: Secondary | ICD-10-CM

## 2021-09-15 DIAGNOSIS — M25662 Stiffness of left knee, not elsewhere classified: Secondary | ICD-10-CM

## 2021-09-15 NOTE — Therapy (Signed)
OUTPATIENT PHYSICAL THERAPY LOWER EXTREMITY EVALUATION   Patient Name: Jeanette Rodriguez MRN: [id number] DOB:Jul 10, 1955, 66 y.o., female Today's Date: 09/15/2021   PT End of Session - 09/15/21 1056     Visit Number 8    Number of Visits 21    Date for PT Re-Evaluation 10/15/21    Authorization Type MCR    PT Start Time 1030    PT Stop Time 1115    PT Time Calculation (min) 45 min    Activity Tolerance Patient tolerated treatment well    Behavior During Therapy WFL for tasks assessed/performed                 Past Medical History:  Diagnosis Date   Anxiety    Arthritis    Asthma    Depression    Fibromyalgia    GERD (gastroesophageal reflux disease)    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 [place]   Hypertension    Pneumonia    Past Surgical History:  Procedure Laterality Date   ANKLE SURGERY     x3 right   2007, 2009,2011   TONSILLECTOMY     TOTAL KNEE ARTHROPLASTY Right 08/03/2020   Procedure: TOTAL KNEE ARTHROPLASTY;  Surgeon: Ollen Gross, MD;  Location: [facility];  Service: Orthopedics;  Laterality: Right;    TOTAL KNEE ARTHROPLASTY Left 08/02/2021   Procedure: TOTAL KNEE ARTHROPLASTY;  Surgeon: Ollen Gross, MD;  Location: [facility];  Service: Orthopedics;  Laterality: Left;   Patient Active Problem List   Diagnosis Date Noted   Osteoarthritis of left knee 08/02/2021   Primary osteoarthritis of knee 08/04/2020   OA (osteoarthritis) of knee 08/03/2020    PCP: Mila Palmer, MD  REFERRING PROVIDER: Ollen Gross, MD  REFERRING DIAG: [phone] (ICD-10-CM) - Presence of left artificial knee joint  THERAPY DIAG:  Acute pain of left knee  Stiffness of left knee, not elsewhere classified  Difficulty in walking, not elsewhere classified  Rationale for Evaluation and Treatment Rehabilitation  ONSET DATE: DOS 08/02/21  SUBJECTIVE:   SUBJECTIVE STATEMENT:  PERTINENT HISTORY: none  PAIN:  Are you having pain? Yes: NPRS  scale: 6/10 09/02/2021 Pain location: Lt knee Pain description: ache, sharp Aggravating factors: constant Relieving factors: rest, meds, ice  PRECAUTIONS: None  WEIGHT BEARING RESTRICTIONS No  FALLS:  Has patient fallen in last 6 months? No  LIVING ENVIRONMENT: Lives with: lives with their family and lives with their spouse Lives in: House/apartment  PLOF: Independent  PATIENT GOALS decrease pain, walk   OBJECTIVE:    PATIENT SURVEYS:  FOTO 26  COGNITION:  Overall cognitive status: Within functional limits for tasks assessed     EDEMA:  Circumferential: 46.5 cm      LOWER EXTREMITY ROM:  Passive ROM Left Eval - 6/16 Left 6/22 LEFT 7/6 LEFT 7/10  Knee flexion 56 85 92 100  Knee extension -10 -6 0 0  Ankle dorsiflexion      Ankle plantarflexion       (Blank rows = not tested)  LOWER EXTREMITY MMT:  Lacking quad activation on left side   GAIT: 7/5- no AD, mild antalgic gait but able to reduce with cues to avoid circumduction    TODAY'S TREATMENT:  09/15/21 Pt seen for aquatic therapy today.  Treatment took place in water 3.25-4.5 ft in depth at the [facility] pool. Temp of water was 91.  Pt entered/exited the pool via stairs with hand rail step to gait.  Walking  forward, back and sidestepping multiple widths in 3.6 ft Stretching quad, hamstring and gastroc on step *adductor sets using BB 10s hold x 5  *STS from 3rd step x8. Cues for execution *STS from 3rd step with adductor set (BB) x5 *step ups forward leading R/L x 10, unsupported *CKC TKE R/L x10 *walking between  exercises for recovery *Cycling; scissor; skii sitting on noodle  Pt requires the buoyancy and hydrostatic pressure of water for support, and to offload joints by unweighting joint load by at least 50 % in naval deep water and by at least 75-80% in chest to neck deep water.  Viscosity of the water is needed for resistance of strengthening. Water current perturbations  provides challenge to standing balance requiring increased core activation.   09/02/21 Nu step 5 min L3 LE only MANUAL: edema mob around knee, scar mobilization, passive flexion- supine & seated with distraction SLR 2x10  Mini squats x20  Side-stepping 4 laps    08/30/21 Nu step 5 min L3 LE only MANUAL: edema mob around knee, scar mobilization, passive flexion- supine & seated with distraction SLR- alternating for awareness, neutral & in ER Theract: stair training Heel raises Gastroc stretch standing  08/26/21 Nu step 5 min L3 Ue & LE Seated HSS Supine SLR to avoid quad lag Sidelying diagonal hp abd Heel slide with strap Alt cone taps with chair to left for balance support Fwd step ups  6/29 Nu step 5 min L3 UE & LE Gait- toe off+knee flexion in swing through Seated, alt LAQ 10 each Supine quad set + SLR with education on reducing quad lag Heel slide with strap- flexion to  96 deg today  Sidelying hip abd- progressed to circles Standing weight shift to midline- tends to favor Rt Mini squats at midline      PATIENT EDUCATION:  Education details: Anatomy of condition, POC, HEP, exercise form/rationale  Person educated: Patient Education method: Explanation, Demonstration, Tactile cues, Verbal cues, and Handouts Education comprehension: verbalized understanding, returned demonstration, verbal cues required, tactile cues required, and needs further education   HOME EXERCISE PROGRAM: [email or web address]  ASSESSMENT:  CLINICAL IMPRESSION: Pt indep and safe in setting.  She was seen prior here for rehab of opposite knee (TKR).  Pt tolerates session with good intensity encouraging L knee ROM and strength. Moderate cues for VMO control with eccentric and concentric engagement. Pt teary/frustrated with year long knee issues (bilat).   Concerned of continuing discomfort in right knee which she is instructed is normal (intermittent).  She completes exercises well with good effort. Some  left knee discomfort with side step ups but "tolerable". Min cues and demonstration for proper execution of exercises. She reports compliance with HEP. She will return to [employer]) to follow through with aquatic HEP going forward. Goals ongoing    OBJECTIVE IMPAIRMENTS Abnormal gait, decreased activity tolerance, decreased balance, decreased mobility, difficulty walking, decreased ROM, decreased strength, increased edema, increased muscle spasms, impaired flexibility, impaired sensation, improper body mechanics, and pain.   ACTIVITY LIMITATIONS standing, squatting, stairs, transfers, bed mobility, and locomotion level  PARTICIPATION LIMITATIONS: meal prep, cleaning, driving, shopping, and yard work  PERSONAL FACTORS  current vertigo  are also affecting patient's functional outcome.   REHAB POTENTIAL: Good  CLINICAL DECISION MAKING: Stable/uncomplicated  EVALUATION COMPLEXITY: Low   GOALS: Goals reviewed with patient? Yes  SHORT TERM GOALS: Target date: 09/02/2021  ROM 0-120 Baseline: Goal status: INITIAL  2.  Ambulation without AD in household distances Baseline:  Goal status: achieved  LONG TERM GOALS: Target date: 10/14/2021   Able to ambulate through daily activities with heel toe gait and without AD Baseline:  Goal status: INITIAL  2.  Pt will return to regular exercise program for long term strength Baseline:  Goal status: INITIAL  3.  Pt will meet FOTO goal Baseline:  Goal status: INITIAL  4.  Able to return to yard work without limitation by knee pain Baseline:  Goal status: INITIAL     PLAN: PT FREQUENCY: 2x/week  PT DURATION: 10 weeks  PLANNED INTERVENTIONS: Therapeutic exercises, Therapeutic activity, Neuromuscular re-education, Balance training, Gait training, Patient/Family education, Joint mobilization, Stair training, Aquatic Therapy, Dry Needling, Electrical stimulation, Cryotherapy, Moist heat, Taping, Manual therapy, and  Re-evaluation  PLAN FOR NEXT SESSION: cont CKC midline quad activation, review gait, step down form Corrie Dandy Tomma Lightning) Seana Underwood MPT 09/15/21 10:58 AM

## 2021-09-17 ENCOUNTER — Ambulatory Visit (HOSPITAL_BASED_OUTPATIENT_CLINIC_OR_DEPARTMENT_OTHER): Payer: Medicare Other | Admitting: Physical Therapy

## 2021-09-17 ENCOUNTER — Other Ambulatory Visit (HOSPITAL_BASED_OUTPATIENT_CLINIC_OR_DEPARTMENT_OTHER): Payer: Self-pay

## 2021-09-17 ENCOUNTER — Encounter (HOSPITAL_BASED_OUTPATIENT_CLINIC_OR_DEPARTMENT_OTHER): Payer: Self-pay | Admitting: Physical Therapy

## 2021-09-17 DIAGNOSIS — R262 Difficulty in walking, not elsewhere classified: Secondary | ICD-10-CM

## 2021-09-17 DIAGNOSIS — R600 Localized edema: Secondary | ICD-10-CM | POA: Diagnosis not present

## 2021-09-17 DIAGNOSIS — M25562 Pain in left knee: Secondary | ICD-10-CM

## 2021-09-17 DIAGNOSIS — M25662 Stiffness of left knee, not elsewhere classified: Secondary | ICD-10-CM

## 2021-09-17 NOTE — Therapy (Signed)
OUTPATIENT PHYSICAL THERAPY LOWER EXTREMITY EVALUATION   Patient Name: Jeanette Rodriguez MRN: [id number] DOB:Mar 16, 1955, 66 y.o., female Today's Date: 09/17/2021   PT End of Session - 09/17/21 1205     Visit Number 9    Number of Visits 21    Date for PT Re-Evaluation 10/15/21    Authorization Type MCR    PT Start Time 1201    PT Stop Time 1245    PT Time Calculation (min) 44 min    Activity Tolerance Patient tolerated treatment well    Behavior During Therapy WFL for tasks assessed/performed                 Past Medical History:  Diagnosis Date   Anxiety    Arthritis    Asthma    Depression    Fibromyalgia    GERD (gastroesophageal reflux disease)    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 [place]   Hypertension    Pneumonia    Past Surgical History:  Procedure Laterality Date   ANKLE SURGERY     x3 right   2007, 2009,2011   TONSILLECTOMY     TOTAL KNEE ARTHROPLASTY Right 08/03/2020   Procedure: TOTAL KNEE ARTHROPLASTY;  Surgeon: Ollen Gross, MD;  Location: [facility];  Service: Orthopedics;  Laterality: Right;    TOTAL KNEE ARTHROPLASTY Left 08/02/2021   Procedure: TOTAL KNEE ARTHROPLASTY;  Surgeon: Ollen Gross, MD;  Location: [facility];  Service: Orthopedics;  Laterality: Left;   Patient Active Problem List   Diagnosis Date Noted   Osteoarthritis of left knee 08/02/2021   Primary osteoarthritis of knee 08/04/2020   OA (osteoarthritis) of knee 08/03/2020    PCP: Mila Palmer, MD  REFERRING PROVIDER: Ollen Gross, MD  REFERRING DIAG: [phone] (ICD-10-CM) - Presence of left artificial knee joint  THERAPY DIAG:  Acute pain of left knee  Stiffness of left knee, not elsewhere classified  Difficulty in walking, not elsewhere classified  Rationale for Evaluation and Treatment Rehabilitation  ONSET DATE: DOS 08/02/21  SUBJECTIVE:   SUBJECTIVE STATEMENT: "doing better today, have already worked for about an hour in the hot tub  and on land"  PERTINENT HISTORY: none  PAIN:  Are you having pain? Yes: NPRS scale: 5/10 current Pain location: Lt knee Pain description: ache, sharp Aggravating factors: constant Relieving factors: rest, meds, ice  PRECAUTIONS: None  WEIGHT BEARING RESTRICTIONS No  FALLS:  Has patient fallen in last 6 months? No  LIVING ENVIRONMENT: Lives with: lives with their family and lives with their spouse Lives in: House/apartment  PLOF: Independent  PATIENT GOALS decrease pain, walk   OBJECTIVE:    PATIENT SURVEYS:  FOTO 26  COGNITION:  Overall cognitive status: Within functional limits for tasks assessed     EDEMA:  Circumferential: 46.5 cm      LOWER EXTREMITY ROM:  Passive ROM Left Eval - 6/16 Left 6/22 LEFT 7/6 LEFT 7/10  Knee flexion 56 85 92 100  Knee extension -10 -6 0 0  Ankle dorsiflexion      Ankle plantarflexion       (Blank rows = not tested)  LOWER EXTREMITY MMT:  Lacking quad activation on left side   GAIT: 7/5- no AD, mild antalgic gait but able to reduce with cues to avoid circumduction    TODAY'S TREATMENT:  09/15/21 Pt seen for aquatic therapy today.  Treatment took place in water 3.25-4.5 ft in depth at the [facility] pool. Temp of water  was 91.  Pt entered/exited the pool via stairs with hand rail step to gait.  Walking forward, back and sidestepping multiple widths in 3.6 ft Stretching quad, hamstring and gastroc on step R/L 3x20-25s hold; knee flex stretch lunge on 2nd step *hip flex stretch foot supported on 2nd step *step ups forward leading R/L x 10, unsupported *CKC TKE R/L x10 bottom step *side lunges 6 widths ue add/abd with yellow hand buoys *Standing bottom step: toe raises x20 (gastroc), toe raises with knees flexed (soleus) x 10 *stair climbing x 5 steps ascend and descend x3.  Cues for execution/knee flex without circumduction *walking between  exercises for recovery  Pt requires the buoyancy and  hydrostatic pressure of water for support, and to offload joints by unweighting joint load by at least 50 % in naval deep water and by at least 75-80% in chest to neck deep water.  Viscosity of the water is needed for resistance of strengthening. Water current perturbations provides challenge to standing balance requiring increased core activation.  09/15/21 Pt seen for aquatic therapy today.  Treatment took place in water 3.25-4.5 ft in depth at the [facility] pool. Temp of water was 91.  Pt entered/exited the pool via stairs with hand rail step to gait.  Walking forward, back and sidestepping multiple widths in 3.6 ft Stretching quad, hamstring and gastroc on step *adductor sets using BB 10s hold x 5  *STS from 3rd step x8. Cues for execution *STS from 3rd step with adductor set (BB) x5 *step ups forward leading R/L x 10, unsupported *CKC TKE R/L x10 *walking between  exercises for recovery *Cycling; scissor; skii sitting on noodle  Pt requires the buoyancy and hydrostatic pressure of water for support, and to offload joints by unweighting joint load by at least 50 % in naval deep water and by at least 75-80% in chest to neck deep water.  Viscosity of the water is needed for resistance of strengthening. Water current perturbations provides challenge to standing balance requiring increased core activation.   09/02/21 Nu step 5 min L3 LE only MANUAL: edema mob around knee, scar mobilization, passive flexion- supine & seated with distraction SLR 2x10  Mini squats x20  Side-stepping 4 laps    08/30/21 Nu step 5 min L3 LE only MANUAL: edema mob around knee, scar mobilization, passive flexion- supine & seated with distraction SLR- alternating for awareness, neutral & in ER Theract: stair training Heel raises Gastroc stretch standing  08/26/21 Nu step 5 min L3 Ue & LE Seated HSS Supine SLR to avoid quad lag Sidelying diagonal hp abd Heel slide with strap Alt cone taps with  chair to left for balance support Fwd step ups    PATIENT EDUCATION:  Education details: Anatomy of condition, POC, HEP, exercise form/rationale  Person educated: Patient Education method: Explanation, Demonstration, Tactile cues, Verbal cues, and Handouts Education comprehension: verbalized understanding, returned demonstration, verbal cues required, tactile cues required, and needs further education   HOME EXERCISE PROGRAM: [email or web address]  ASSESSMENT:  CLINICAL IMPRESSION: Improving step down form (steps) when cadence slowed. Cues for eccentric control with TKE and step down on steps. She does report increased knee pain and pressure with task that ceases with completion of ex.  Min cues needed for level pelvis with CKC TKE. She has good toleration of session.  Reports fatigue upon completion.     OBJECTIVE IMPAIRMENTS Abnormal gait, decreased activity tolerance, decreased balance, decreased mobility, difficulty walking, decreased ROM, decreased strength, increased edema, increased muscle  spasms, impaired flexibility, impaired sensation, improper body mechanics, and pain.   ACTIVITY LIMITATIONS standing, squatting, stairs, transfers, bed mobility, and locomotion level  PARTICIPATION LIMITATIONS: meal prep, cleaning, driving, shopping, and yard work  PERSONAL FACTORS  current vertigo  are also affecting patient's functional outcome.   REHAB POTENTIAL: Good  CLINICAL DECISION MAKING: Stable/uncomplicated  EVALUATION COMPLEXITY: Low   GOALS: Goals reviewed with patient? Yes  SHORT TERM GOALS: Target date: 09/02/2021  ROM 0-120 Baseline: Goal status: INITIAL  2.  Ambulation without AD in household distances Baseline:  Goal status: achieved   LONG TERM GOALS: Target date: 10/14/2021   Able to ambulate through daily activities with heel toe gait and without AD Baseline:  Goal status: INITIAL  2.  Pt will return to regular exercise program for long term strength Baseline:   Goal status: INITIAL  3.  Pt will meet FOTO goal Baseline:  Goal status: INITIAL  4.  Able to return to yard work without limitation by knee pain Baseline:  Goal status: INITIAL     PLAN: PT FREQUENCY: 2x/week  PT DURATION: 10 weeks  PLANNED INTERVENTIONS: Therapeutic exercises, Therapeutic activity, Neuromuscular re-education, Balance training, Gait training, Patient/Family education, Joint mobilization, Stair training, Aquatic Therapy, Dry Needling, Electrical stimulation, Cryotherapy, Moist heat, Taping, Manual therapy, and Re-evaluation  PLAN FOR NEXT SESSION: cont CKC midline quad activation, review gait, step down form Corrie Dandy Tomma Lightning) Quiera Diffee MPT 09/17/21 1:27 PM

## 2021-09-20 ENCOUNTER — Encounter (HOSPITAL_BASED_OUTPATIENT_CLINIC_OR_DEPARTMENT_OTHER): Payer: Self-pay

## 2021-09-20 ENCOUNTER — Other Ambulatory Visit (HOSPITAL_BASED_OUTPATIENT_CLINIC_OR_DEPARTMENT_OTHER): Payer: Self-pay

## 2021-09-20 MED ORDER — GABAPENTIN 100 MG PO CAPS
ORAL_CAPSULE | ORAL | 1 refills | Status: DC
  Filled 2021-09-20: qty 360, 90d supply, fill #0

## 2021-09-20 MED ORDER — MECLIZINE HCL 25 MG PO TABS
ORAL_TABLET | ORAL | 0 refills | Status: DC
  Filled 2021-09-20: qty 15, 7d supply, fill #0

## 2021-09-20 MED ORDER — FLUTICASONE PROPIONATE 50 MCG/ACT NA SUSP
NASAL | 0 refills | Status: DC
  Filled 2021-09-20: qty 16, 60d supply, fill #0

## 2021-09-21 ENCOUNTER — Encounter (HOSPITAL_BASED_OUTPATIENT_CLINIC_OR_DEPARTMENT_OTHER): Payer: Self-pay | Admitting: Physical Therapy

## 2021-09-21 ENCOUNTER — Ambulatory Visit (HOSPITAL_BASED_OUTPATIENT_CLINIC_OR_DEPARTMENT_OTHER): Payer: Medicare Other | Attending: Orthopedic Surgery | Admitting: Physical Therapy

## 2021-09-21 ENCOUNTER — Other Ambulatory Visit (HOSPITAL_BASED_OUTPATIENT_CLINIC_OR_DEPARTMENT_OTHER): Payer: Self-pay

## 2021-09-21 DIAGNOSIS — R262 Difficulty in walking, not elsewhere classified: Secondary | ICD-10-CM | POA: Insufficient documentation

## 2021-09-21 DIAGNOSIS — M25562 Pain in left knee: Secondary | ICD-10-CM | POA: Insufficient documentation

## 2021-09-21 DIAGNOSIS — R600 Localized edema: Secondary | ICD-10-CM | POA: Insufficient documentation

## 2021-09-21 DIAGNOSIS — M25662 Stiffness of left knee, not elsewhere classified: Secondary | ICD-10-CM | POA: Insufficient documentation

## 2021-09-21 MED ORDER — OZEMPIC (2 MG/DOSE) 8 MG/3ML ~~LOC~~ SOPN
PEN_INJECTOR | SUBCUTANEOUS | 2 refills | Status: DC
  Filled 2021-09-21: qty 6, 56d supply, fill #0
  Filled 2021-09-21: qty 6, fill #0

## 2021-09-21 MED ORDER — MONTELUKAST SODIUM 10 MG PO TABS
ORAL_TABLET | ORAL | 0 refills | Status: DC
  Filled 2021-09-21: qty 90, 90d supply, fill #0

## 2021-09-21 NOTE — Therapy (Signed)
OUTPATIENT PHYSICAL THERAPY LOWER EXTREMITY EVALUATION  Progress Note Reporting Period 08/06/21 to 09/21/21  See note below for Objective Data and Assessment of Progress/Goals.     Patient Name: Jeanette Rodriguez MRN: [id number] DOB:02-27-1955, 66 y.o., female Today's Date: 09/21/2021   PT End of Session - 09/21/21 1704     Visit Number 10    Number of Visits 21    Date for PT Re-Evaluation 10/15/21    Authorization Type MCR    Progress Note Due on Visit 20    PT Start Time 1700    PT Stop Time 1745    PT Time Calculation (min) 45 min    Activity Tolerance Patient tolerated treatment well    Behavior During Therapy WFL for tasks assessed/performed                 Past Medical History:  Diagnosis Date   Anxiety    Arthritis    Asthma    Depression    Fibromyalgia    GERD (gastroesophageal reflux disease)    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 [place]   Hypertension    Pneumonia    Past Surgical History:  Procedure Laterality Date   ANKLE SURGERY     x3 right   2007, 2009,2011   TONSILLECTOMY     TOTAL KNEE ARTHROPLASTY Right 08/03/2020   Procedure: TOTAL KNEE ARTHROPLASTY;  Surgeon: Ollen Gross, MD;  Location: [facility];  Service: Orthopedics;  Laterality: Right;    TOTAL KNEE ARTHROPLASTY Left 08/02/2021   Procedure: TOTAL KNEE ARTHROPLASTY;  Surgeon: Ollen Gross, MD;  Location: [facility];  Service: Orthopedics;  Laterality: Left;   Patient Active Problem List   Diagnosis Date Noted   Osteoarthritis of left knee 08/02/2021   Primary osteoarthritis of knee 08/04/2020   OA (osteoarthritis) of knee 08/03/2020    PCP: Mila Palmer, MD  REFERRING PROVIDER: Ollen Gross, MD  REFERRING DIAG: [phone] (ICD-10-CM) - Presence of left artificial knee joint  THERAPY DIAG:  Acute pain of left knee  Stiffness of left knee, not elsewhere classified  Difficulty in walking, not elsewhere classified  Rationale for Evaluation and  Treatment Rehabilitation  ONSET DATE: DOS 08/02/21  SUBJECTIVE:   SUBJECTIVE STATEMENT: "doing better today, have already worked for about an hour in the hot tub and on land"  PERTINENT HISTORY: none  PAIN:  Are you having pain? Yes: NPRS scale: 5/10 current Pain location: Lt knee Pain description: ache, sharp Aggravating factors: constant Relieving factors: rest, meds, ice  PRECAUTIONS: None  WEIGHT BEARING RESTRICTIONS No  FALLS:  Has patient fallen in last 6 months? No  LIVING ENVIRONMENT: Lives with: lives with their family and lives with their spouse Lives in: House/apartment  PLOF: Independent  PATIENT GOALS decrease pain, walk   OBJECTIVE:    PATIENT SURVEYS:  FOTO 26 09/21/21: 52  COGNITION:  Overall cognitive status: Within functional limits for tasks assessed     EDEMA:  Circumferential: 46.5 cm 09/21/21 44.0 cm      LOWER EXTREMITY ROM:  Passive ROM Left Eval - 6/16 Left 6/22 LEFT 7/6 LEFT 7/10 Left 8/1  Knee flexion 56 85 92 100 112  Knee extension -10 -6 0 0 0  Ankle dorsiflexion       Ankle plantarflexion        (Blank rows = not tested)  LOWER EXTREMITY MMT:  Lacking quad activation on left side   GAIT: 7/5- no AD, mild antalgic  gait but able to reduce with cues to avoid circumduction    TODAY'S TREATMENT:  09/21/21 Pt seen for aquatic therapy today.  Treatment took place in water 3.25-4.5 ft in depth at the [facility] pool. Temp of water was 91.  Pt entered/exited the pool via stairs with hand rail step to gait.  Walking forward, back and sidestepping multiple widths in 3.6 ft Stretching quad, hamstring and gastroc on step R/L 3x20-25s hold; knee flex stretch lunge on 2nd step *hip flex stretch foot supported on 2nd step *step ups 1 step forward leading R/L x 10, unsupported. Then 2nd step supported x 5 *CKC TKE L x15 bottom step *side lunges 6 widths ue add/abd with yellow hand buoys 3 ft *curtsy lunge x  10 *Standing bottom step: toe raises x20 (gastroc), toe raises with knees flexed (soleus) x 15. Cues for core stabilization *stair climbing x 5 steps ascend and descend x3.  Cues for execution/knee flex without circumduction SLS 3 ft then 1st step.  R/L able to hold >30 s *walking between  exercises for recovery  Pt requires the buoyancy and hydrostatic pressure of water for support, and to offload joints by unweighting joint load by at least 50 % in naval deep water and by at least 75-80% in chest to neck deep water.  Viscosity of the water is needed for resistance of strengthening. Water current perturbations provides challenge to standing balance requiring increased core activation.  09/15/21 Pt seen for aquatic therapy today.  Treatment took place in water 3.25-4.5 ft in depth at the [facility] pool. Temp of water was 91.  Pt entered/exited the pool via stairs with hand rail step to gait.  Walking forward, back and sidestepping multiple widths in 3.6 ft Stretching quad, hamstring and gastroc on step R/L 3x20-25s hold; knee flex stretch lunge on 2nd step *hip flex stretch foot supported on 2nd step *step ups forward leading R/L x 10, unsupported *CKC TKE R/L x10 bottom step *side lunges 6 widths ue add/abd with yellow hand buoys 3 ft *Standing bottom step: toe raises x20 (gastroc), toe raises with knees flexed (soleus) x 10 *stair climbing x 5 steps ascend and descend x3.  Cues for execution/knee flex without circumduction *walking between  exercises for recovery  Pt requires the buoyancy and hydrostatic pressure of water for support, and to offload joints by unweighting joint load by at least 50 % in naval deep water and by at least 75-80% in chest to neck deep water.  Viscosity of the water is needed for resistance of strengthening. Water current perturbations provides challenge to standing balance requiring increased core activation.  09/15/21 Pt seen for aquatic therapy today.   Treatment took place in water 3.25-4.5 ft in depth at the [facility] pool. Temp of water was 91.  Pt entered/exited the pool via stairs with hand rail step to gait.  Walking forward, back and sidestepping multiple widths in 3.6 ft Stretching quad, hamstring and gastroc on step *adductor sets using BB 10s hold x 5  *STS from 3rd step x8. Cues for execution *STS from 3rd step with adductor set (BB) x5 *step ups forward leading R/L x 10, unsupported *CKC TKE R/L x10 *walking between  exercises for recovery *Cycling; scissor; skii sitting on noodle  Pt requires the buoyancy and hydrostatic pressure of water for support, and to offload joints by unweighting joint load by at least 50 % in naval deep water and by at least 75-80% in chest to neck deep  water.  Viscosity of the water is needed for resistance of strengthening. Water current perturbations provides challenge to standing balance requiring increased core activation.   09/02/21 Nu step 5 min L3 LE only MANUAL: edema mob around knee, scar mobilization, passive flexion- supine & seated with distraction SLR 2x10  Mini squats x20  Side-stepping 4 laps    08/30/21 Nu step 5 min L3 LE only MANUAL: edema mob around knee, scar mobilization, passive flexion- supine & seated with distraction SLR- alternating for awareness, neutral & in ER Theract: stair training Heel raises Gastroc stretch standing  08/26/21 Nu step 5 min L3 Ue & LE Seated HSS Supine SLR to avoid quad lag Sidelying diagonal hp abd Heel slide with strap Alt cone taps with chair to left for balance support Fwd step ups    PATIENT EDUCATION:  Education details: Anatomy of condition, POC, HEP, exercise form/rationale  Person educated: Patient Education method: Explanation, Demonstration, Tactile cues, Verbal cues, and Handouts Education comprehension: verbalized understanding, returned demonstration, verbal cues required, tactile cues required, and needs  further education   HOME EXERCISE PROGRAM: [email or web address]  ASSESSMENT:  CLINICAL IMPRESSION: Progress: Pt reports returning to yard work without limitation. Works through discomfort. She has good left quad activation. Able to minimally oscillate patella with contraction. Left knee flex 112d improved from ~60 at intial assessment. She amb greater than household distances venturing out into community grocery shopping without AD. She demonstrates normal heel toe gait pattern with amb in clinic although does report discomfort with initial amb after sitting for extended time. Foto improved to 52 from 26 demonstrating improved functional mobility. Her edema left knee has decreased (see chart). Pain continues to hover around 5/10 on daily basis as she is increasing her activity.  No interruption in her sleep due to pain. She will continue to benefit form skilled therapy to progress towards meeting all goals, returning to PLOF.     OBJECTIVE IMPAIRMENTS Abnormal gait, decreased activity tolerance, decreased balance, decreased mobility, difficulty walking, decreased ROM, decreased strength, increased edema, increased muscle spasms, impaired flexibility, impaired sensation, improper body mechanics, and pain.   ACTIVITY LIMITATIONS standing, squatting, stairs, transfers, bed mobility, and locomotion level  PARTICIPATION LIMITATIONS: meal prep, cleaning, driving, shopping, and yard work  PERSONAL FACTORS  current vertigo  are also affecting patient's functional outcome.   REHAB POTENTIAL: Good  CLINICAL DECISION MAKING: Stable/uncomplicated  EVALUATION COMPLEXITY: Low   GOALS: Goals reviewed with patient? Yes  SHORT TERM GOALS: Target date: 09/02/2021  ROM 0-120 Baseline:112 on 09/22/18 Goal status: ongoing  2.  Ambulation without AD in household distances Baseline:  Goal status: achieved   LONG TERM GOALS: Target date: 10/14/2021   Able to ambulate through daily activities with heel toe gait  and without AD Baseline:  Goal status: ongoing  2.  Pt will return to regular exercise program for long term strength Baseline:  Goal status: Ongoing  3.  Pt will meet FOTO goal Baseline: 26 Goal status: ongoing  4.  Able to return to yard work without limitation by knee pain Baseline:  Goal status: Achieved     PLAN: PT FREQUENCY: 2x/week  PT DURATION: 10 weeks  PLANNED INTERVENTIONS: Therapeutic exercises, Therapeutic activity, Neuromuscular re-education, Balance training, Gait training, Patient/Family education, Joint mobilization, Stair training, Aquatic Therapy, Dry Needling, Electrical stimulation, Cryotherapy, Moist heat, Taping, Manual therapy, and Re-evaluation  PLAN FOR NEXT SESSION: cont CKC midline quad activation, review gait, step down form Corrie Dandy Tomma Lightning) Izzac Rockett MPT 09/21/21 6:14 PM

## 2021-09-23 ENCOUNTER — Ambulatory Visit (HOSPITAL_BASED_OUTPATIENT_CLINIC_OR_DEPARTMENT_OTHER): Payer: Medicare Other | Admitting: Physical Therapy

## 2021-09-23 ENCOUNTER — Encounter (HOSPITAL_BASED_OUTPATIENT_CLINIC_OR_DEPARTMENT_OTHER): Payer: Self-pay | Admitting: Physical Therapy

## 2021-09-23 DIAGNOSIS — M25562 Pain in left knee: Secondary | ICD-10-CM | POA: Diagnosis not present

## 2021-09-23 DIAGNOSIS — M25662 Stiffness of left knee, not elsewhere classified: Secondary | ICD-10-CM

## 2021-09-23 DIAGNOSIS — R262 Difficulty in walking, not elsewhere classified: Secondary | ICD-10-CM

## 2021-09-23 NOTE — Therapy (Signed)
OUTPATIENT PHYSICAL THERAPY LOWER EXTREMITY EVALUATION     Patient Name: Jeanette Rodriguez MRN: [id number] DOB:[birth year], 66 y.o., female Today's Date: 09/23/2021   PT End of Session - 09/23/21 1409     Visit Number 11    Number of Visits 21    Date for PT Re-Evaluation 10/15/21    Authorization Type MCR    Progress Note Due on Visit 20    PT Start Time 1406    PT Stop Time 1445    PT Time Calculation (min) 39 min    Activity Tolerance Patient tolerated treatment well    Behavior During Therapy WFL for tasks assessed/performed                 Past Medical History:  Diagnosis Date   Anxiety    Arthritis    Asthma    Depression    Fibromyalgia    GERD (gastroesophageal reflux disease)    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 [place]   Hypertension    Pneumonia    Past Surgical History:  Procedure Laterality Date   ANKLE SURGERY     x3 right   2007, 2009,2011   TONSILLECTOMY     TOTAL KNEE ARTHROPLASTY Right 08/03/2020   Procedure: TOTAL KNEE ARTHROPLASTY;  Surgeon: Ollen Gross, MD;  Location: [facility];  Service: Orthopedics;  Laterality: Right;    TOTAL KNEE ARTHROPLASTY Left 08/02/2021   Procedure: TOTAL KNEE ARTHROPLASTY;  Surgeon: Ollen Gross, MD;  Location: [facility];  Service: Orthopedics;  Laterality: Left;   Patient Active Problem List   Diagnosis Date Noted   Osteoarthritis of left knee 08/02/2021   Primary osteoarthritis of knee 08/04/2020   OA (osteoarthritis) of knee 08/03/2020    PCP: Mila Palmer, MD  REFERRING PROVIDER: Ollen Gross, MD  REFERRING DIAG: [phone] (ICD-10-CM) - Presence of left artificial knee joint  THERAPY DIAG:  Acute pain of left knee  Stiffness of left knee, not elsewhere classified  Difficulty in walking, not elsewhere classified  Rationale for Evaluation and Treatment Rehabilitation  ONSET DATE: DOS 08/02/21  SUBJECTIVE:   SUBJECTIVE STATEMENT: "Woke up this morning and was really  stiff"  PERTINENT HISTORY: none  PAIN:  Are you having pain? Yes: NPRS scale: 6/10 current Pain location: Lt knee Pain description: ache, sharp Aggravating factors: constant Relieving factors: rest, meds, ice  PRECAUTIONS: None  WEIGHT BEARING RESTRICTIONS No  FALLS:  Has patient fallen in last 6 months? No  LIVING ENVIRONMENT: Lives with: lives with their family and lives with their spouse Lives in: House/apartment  PLOF: Independent  PATIENT GOALS decrease pain, walk   OBJECTIVE:    PATIENT SURVEYS:  FOTO 26 09/21/21: 52  COGNITION:  Overall cognitive status: Within functional limits for tasks assessed     EDEMA:  Circumferential: 46.5 cm 09/21/21 44.0 cm      LOWER EXTREMITY ROM:  Passive ROM Left Eval - 6/16 Left 6/22 LEFT 7/6 LEFT 7/10 Left 8/1  Knee flexion 56 85 92 100 112  Knee extension -10 -6 0 0 0  Ankle dorsiflexion       Ankle plantarflexion        (Blank rows = not tested)  LOWER EXTREMITY MMT:  Lacking quad activation on left side   GAIT: 7/5- no AD, mild antalgic gait but able to reduce with cues to avoid circumduction    TODAY'S TREATMENT:  09/23/21 Pt seen for aquatic therapy today.  Treatment took place in  water 3.25-4.5 ft in depth at the [facility] pool. Temp of water was 91.  Pt entered/exited the pool via stairs with hand rail step to gait.  Walking forward, back and sidestepping multiple widths in 3.6 ft Stretching quad, hamstring and gastroc on step R/L 3x20-25s hold; knee flex stretch lunge on 2nd step *hip flex stretch foot supported on 2nd step R/L *step ups 1 step forward leading R/L x 10, unsupported. Then 2nd step supported x 10 R/L *CKC TKE R/L 2 x10 bottom step *side lunges 6 widths ue add/abd with yellow hand buoys 3 ft *forward amb with straight leg kicks x 4 widths *Monster walk x 3 widths *hurdle walk x 3 widths *Seated : flutter; add/abd 4x 25 reps    *curtsy lunge x 10 *Standing  bottom step: toe raises x20 (gastroc), toe raises with knees flexed (soleus) x 15. Cues for core stabilization *stair climbing x 5 steps ascend and descend x3.  Cues for execution/knee flex without circumduction SLS 3 ft then 1st step.  R/L able to hold >30 s *walking between exercises for recovery  Pt requires the buoyancy and hydrostatic pressure of water for support, and to offload joints by unweighting joint load by at least 50 % in naval deep water and by at least 75-80% in chest to neck deep water.  Viscosity of the water is needed for resistance of strengthening. Water current perturbations provides challenge to standing balance requiring increased core activation.      09/02/21 Nu step 5 min L3 LE only MANUAL: edema mob around knee, scar mobilization, passive flexion- supine & seated with distraction SLR 2x10  Mini squats x20  Side-stepping 4 laps      PATIENT EDUCATION:  Education details: Anatomy of condition, POC, HEP, exercise form/rationale  Person educated: Patient Education method: Explanation, Demonstration, Tactile cues, Verbal cues, and Handouts Education comprehension: verbalized understanding, returned demonstration, verbal cues required, tactile cues required, and needs further education   HOME EXERCISE PROGRAM: [email or web address] Added aquatics 09/23/21  ASSESSMENT:  CLINICAL IMPRESSION: Pt reports compliance with HEP. Improvement with TKE left completing step ups. Gaining complete extension with ea rep. Added focus on proximal strength (hip) and aerobic capacity. She tolerates session very well.  Progress: Pt reports returning to yard work without limitation. Works through discomfort. She has good left quad activation. Able to minimally oscillate patella with contraction. Left knee flex 112d improved from ~60 at intial assessment. She amb greater than household distances venturing out into community grocery shopping without AD. She demonstrates normal heel toe gait  pattern with amb in clinic although does report discomfort with initial amb after sitting for extended time. Foto improved to 52 from 26 demonstrating improved functional mobility. Her edema left knee has decreased (see chart). Pain continues to hover around 5/10 on daily basis as she is increasing her activity.  No interruption in her sleep due to pain. She will continue to benefit form skilled therapy to progress towards meeting all goals, returning to PLOF.     OBJECTIVE IMPAIRMENTS Abnormal gait, decreased activity tolerance, decreased balance, decreased mobility, difficulty walking, decreased ROM, decreased strength, increased edema, increased muscle spasms, impaired flexibility, impaired sensation, improper body mechanics, and pain.   ACTIVITY LIMITATIONS standing, squatting, stairs, transfers, bed mobility, and locomotion level  PARTICIPATION LIMITATIONS: meal prep, cleaning, driving, shopping, and yard work  PERSONAL FACTORS  current vertigo  are also affecting patient's functional outcome.   REHAB POTENTIAL: Good  CLINICAL DECISION MAKING: Stable/uncomplicated  EVALUATION COMPLEXITY: Low  GOALS: Goals reviewed with patient? Yes  SHORT TERM GOALS: Target date: 09/02/2021  ROM 0-120 Baseline:112 on 09/22/18 Goal status: ongoing  2.  Ambulation without AD in household distances Baseline:  Goal status: achieved   LONG TERM GOALS: Target date: 10/14/2021   Able to ambulate through daily activities with heel toe gait and without AD Baseline:  Goal status: ongoing  2.  Pt will return to regular exercise program for long term strength Baseline:  Goal status: Ongoing  3.  Pt will meet FOTO goal Baseline: 26 Goal status: ongoing  4.  Able to return to yard work without limitation by knee pain Baseline:  Goal status: Achieved     PLAN: PT FREQUENCY: 2x/week  PT DURATION: 10 weeks  PLANNED INTERVENTIONS: Therapeutic exercises, Therapeutic activity, Neuromuscular  re-education, Balance training, Gait training, Patient/Family education, Joint mobilization, Stair training, Aquatic Therapy, Dry Needling, Electrical stimulation, Cryotherapy, Moist heat, Taping, Manual therapy, and Re-evaluation  PLAN FOR NEXT SESSION: Will laminate and instruct HEP. cont CKC midline quad activation, review gait, step down form Corrie Dandy Tomma Lightning) Jarious Lyon MPT 09/23/21 2:46 PM

## 2021-09-28 ENCOUNTER — Ambulatory Visit (HOSPITAL_BASED_OUTPATIENT_CLINIC_OR_DEPARTMENT_OTHER): Payer: Medicare Other | Admitting: Physical Therapy

## 2021-09-28 ENCOUNTER — Encounter (HOSPITAL_BASED_OUTPATIENT_CLINIC_OR_DEPARTMENT_OTHER): Payer: Self-pay | Admitting: Physical Therapy

## 2021-09-28 DIAGNOSIS — R262 Difficulty in walking, not elsewhere classified: Secondary | ICD-10-CM

## 2021-09-28 DIAGNOSIS — M25562 Pain in left knee: Secondary | ICD-10-CM

## 2021-09-28 DIAGNOSIS — M25662 Stiffness of left knee, not elsewhere classified: Secondary | ICD-10-CM

## 2021-09-28 NOTE — Therapy (Signed)
OUTPATIENT PHYSICAL THERAPY LOWER EXTREMITY      Patient Name: Jeanette Rodriguez MRN: [id number] DOB:10-19-1955, 66 y.o., female Today's Date: 09/28/2021   PT End of Session - 09/28/21 1000     Visit Number 12    Number of Visits 21    Date for PT Re-Evaluation 10/15/21    Authorization Type MCR    Progress Note Due on Visit 20    PT Start Time 0950    PT Stop Time 1030    PT Time Calculation (min) 40 min    Activity Tolerance Patient tolerated treatment well    Behavior During Therapy WFL for tasks assessed/performed                 Past Medical History:  Diagnosis Date   Anxiety    Arthritis    Asthma    Depression    Fibromyalgia    GERD (gastroesophageal reflux disease)    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 [place]   Hypertension    Pneumonia    Past Surgical History:  Procedure Laterality Date   ANKLE SURGERY     x3 right   2007, 2009,2011   TONSILLECTOMY     TOTAL KNEE ARTHROPLASTY Right 08/03/2020   Procedure: TOTAL KNEE ARTHROPLASTY;  Surgeon: Ollen Gross, MD;  Location: [facility];  Service: Orthopedics;  Laterality: Right;    TOTAL KNEE ARTHROPLASTY Left 08/02/2021   Procedure: TOTAL KNEE ARTHROPLASTY;  Surgeon: Ollen Gross, MD;  Location: [facility];  Service: Orthopedics;  Laterality: Left;   Patient Active Problem List   Diagnosis Date Noted   Osteoarthritis of left knee 08/02/2021   Primary osteoarthritis of knee 08/04/2020   OA (osteoarthritis) of knee 08/03/2020    PCP: Mila Palmer, MD  REFERRING PROVIDER: Ollen Gross, MD  REFERRING DIAG: [phone] (ICD-10-CM) - Presence of left artificial knee joint  THERAPY DIAG:  Acute pain of left knee  Stiffness of left knee, not elsewhere classified  Difficulty in walking, not elsewhere classified  Rationale for Evaluation and Treatment Rehabilitation  ONSET DATE: DOS 08/02/21  SUBJECTIVE:   SUBJECTIVE STATEMENT: "Just got up and I am stiff, pain up a  little"  PERTINENT HISTORY: none  PAIN:  Are you having pain? Yes: NPRS scale: 6/10 current Pain location: Lt knee Pain description: ache, sharp Aggravating factors: constant Relieving factors: rest, meds, ice  PRECAUTIONS: None  WEIGHT BEARING RESTRICTIONS No  FALLS:  Has patient fallen in last 6 months? No  LIVING ENVIRONMENT: Lives with: lives with their family and lives with their spouse Lives in: House/apartment  PLOF: Independent  PATIENT GOALS decrease pain, walk   OBJECTIVE:    PATIENT SURVEYS:  FOTO 26 09/21/21: 52  COGNITION:  Overall cognitive status: Within functional limits for tasks assessed     EDEMA:  Circumferential: 46.5 cm 09/21/21 44.0 cm      LOWER EXTREMITY ROM:  Passive ROM Left Eval - 6/16 Left 6/22 LEFT 7/6 LEFT 7/10 Left 8/1  Knee flexion 56 85 92 100 112  Knee extension -10 -6 0 0 0  Ankle dorsiflexion       Ankle plantarflexion        (Blank rows = not tested)  LOWER EXTREMITY MMT:  Lacking quad activation on left side   GAIT: 7/5- no AD, mild antalgic gait but able to reduce with cues to avoid circumduction    TODAY'S TREATMENT:  09/23/21 Pt seen for aquatic therapy today.  Treatment  took place in water 3.25-4.5 ft in depth at the [facility] pool. Temp of water was 91.  Pt entered/exited the pool via stairs with hand rail step to gait.  Walking forward, back and sidestepping multiple widths in 3.6 ft *curtsy lunge x 10 *side lunges 6 widths ue add/abd with yellow hand buoys 3 ft *CKC TKE R/L 2 x10 bottom step *stair climbing x 5 steps ascend and descend x2.   *step ups 1 step backward leading R x 10, unsupported. Forward 2nd step supported x 5 R/L Stretching quad, hamstring and gastroc on step R/L 3x20-25s hold; knee flex stretch lunge on 2nd step *hip flex stretch foot supported on 2nd step R/L    Pt requires the buoyancy and hydrostatic pressure of water for support, and to offload joints by  unweighting joint load by at least 50 % in naval deep water and by at least 75-80% in chest to neck deep water.  Viscosity of the water is needed for resistance of strengthening. Water current perturbations provides challenge to standing balance requiring increased core activation.      09/02/21 Nu step 5 min L3 LE only MANUAL: edema mob around knee, scar mobilization, passive flexion- supine & seated with distraction SLR 2x10  Mini squats x20  Side-stepping 4 laps      PATIENT EDUCATION:  Education details: Anatomy of condition, POC, HEP, exercise form/rationale  Person educated: Patient Education method: Explanation, Demonstration, Tactile cues, Verbal cues, and Handouts Education comprehension: verbalized understanding, returned demonstration, verbal cues required, tactile cues required, and needs further education   HOME EXERCISE PROGRAM: [email or web address] Added aquatics 09/23/21  ASSESSMENT:  CLINICAL IMPRESSION: Continued limiting pain lat hamstring tendon..  Unable to tolerate step ups to 2nd step. Pt instructed on using ice at home either pack or ice massage. Pt compliant with HEP. Gaining full Right knee extension with stepping onto step with improved VMO strength.     Progress: Pt reports returning to yard work without limitation. Works through discomfort. She has good left quad activation. Able to minimally oscillate patella with contraction. Left knee flex 112d improved from ~60 at intial assessment. She amb greater than household distances venturing out into community grocery shopping without AD. She demonstrates normal heel toe gait pattern with amb in clinic although does report discomfort with initial amb after sitting for extended time. Foto improved to 52 from 26 demonstrating improved functional mobility. Her edema left knee has decreased (see chart). Pain continues to hover around 5/10 on daily basis as she is increasing her activity.  No interruption in her sleep due to  pain. She will continue to benefit form skilled therapy to progress towards meeting all goals, returning to PLOF.     OBJECTIVE IMPAIRMENTS Abnormal gait, decreased activity tolerance, decreased balance, decreased mobility, difficulty walking, decreased ROM, decreased strength, increased edema, increased muscle spasms, impaired flexibility, impaired sensation, improper body mechanics, and pain.   ACTIVITY LIMITATIONS standing, squatting, stairs, transfers, bed mobility, and locomotion level  PARTICIPATION LIMITATIONS: meal prep, cleaning, driving, shopping, and yard work  PERSONAL FACTORS  current vertigo  are also affecting patient's functional outcome.   REHAB POTENTIAL: Good  CLINICAL DECISION MAKING: Stable/uncomplicated  EVALUATION COMPLEXITY: Low   GOALS: Goals reviewed with patient? Yes  SHORT TERM GOALS: Target date: 09/02/2021  ROM 0-120 Baseline:112 on 09/22/18 Goal status: ongoing  2.  Ambulation without AD in household distances Baseline:  Goal status: achieved   LONG TERM GOALS: Target date: 10/14/2021   Able to ambulate  through daily activities with heel toe gait and without AD Baseline:  Goal status: ongoing  2.  Pt will return to regular exercise program for long term strength Baseline:  Goal status: Ongoing  3.  Pt will meet FOTO goal Baseline: 26 Goal status: ongoing  4.  Able to return to yard work without limitation by knee pain Baseline:  Goal status: Achieved     PLAN: PT FREQUENCY: 2x/week  PT DURATION: 10 weeks  PLANNED INTERVENTIONS: Therapeutic exercises, Therapeutic activity, Neuromuscular re-education, Balance training, Gait training, Patient/Family education, Joint mobilization, Stair training, Aquatic Therapy, Dry Needling, Electrical stimulation, Cryotherapy, Moist heat, Taping, Manual therapy, and Re-evaluation  PLAN FOR NEXT SESSION: Will laminate and instruct HEP. cont CKC midline quad activation, review gait, step down  form Corrie Dandy Tomma Lightning) Tranesha Lessner MPT 09/28/21 11:28 AM

## 2021-09-30 ENCOUNTER — Ambulatory Visit (HOSPITAL_BASED_OUTPATIENT_CLINIC_OR_DEPARTMENT_OTHER): Payer: Self-pay | Admitting: Physical Therapy

## 2021-09-30 ENCOUNTER — Other Ambulatory Visit (HOSPITAL_BASED_OUTPATIENT_CLINIC_OR_DEPARTMENT_OTHER): Payer: Self-pay

## 2021-09-30 MED ORDER — OXYCODONE-ACETAMINOPHEN 10-325 MG PO TABS
1.0000 | ORAL_TABLET | Freq: Three times a day (TID) | ORAL | 0 refills | Status: DC | PRN
  Filled 2021-09-30: qty 90, 30d supply, fill #0

## 2021-10-05 ENCOUNTER — Other Ambulatory Visit (HOSPITAL_BASED_OUTPATIENT_CLINIC_OR_DEPARTMENT_OTHER): Payer: Self-pay

## 2021-10-05 MED ORDER — FLUTICASONE PROPIONATE 50 MCG/ACT NA SUSP
NASAL | 3 refills | Status: DC
  Filled 2021-10-22: qty 16, 60d supply, fill #0
  Filled 2021-12-23: qty 48, 90d supply, fill #0
  Filled 2022-04-25 – 2022-04-26 (×2): qty 48, 90d supply, fill #1

## 2021-10-05 MED ORDER — HYDROCHLOROTHIAZIDE 25 MG PO TABS
ORAL_TABLET | ORAL | 3 refills | Status: DC
  Filled 2021-10-05 – 2021-12-23 (×2): qty 90, 90d supply, fill #0
  Filled 2022-04-19: qty 90, 90d supply, fill #1
  Filled 2022-07-19: qty 90, 90d supply, fill #2

## 2021-10-06 ENCOUNTER — Encounter (HOSPITAL_BASED_OUTPATIENT_CLINIC_OR_DEPARTMENT_OTHER): Payer: Self-pay | Admitting: Physical Therapy

## 2021-10-06 ENCOUNTER — Encounter (HOSPITAL_BASED_OUTPATIENT_CLINIC_OR_DEPARTMENT_OTHER): Payer: Self-pay

## 2021-10-08 ENCOUNTER — Encounter (HOSPITAL_BASED_OUTPATIENT_CLINIC_OR_DEPARTMENT_OTHER): Payer: Self-pay | Admitting: Physical Therapy

## 2021-10-08 ENCOUNTER — Ambulatory Visit (HOSPITAL_BASED_OUTPATIENT_CLINIC_OR_DEPARTMENT_OTHER): Payer: Medicare Other | Admitting: Physical Therapy

## 2021-10-08 DIAGNOSIS — M25662 Stiffness of left knee, not elsewhere classified: Secondary | ICD-10-CM

## 2021-10-08 DIAGNOSIS — M25562 Pain in left knee: Secondary | ICD-10-CM | POA: Diagnosis not present

## 2021-10-08 DIAGNOSIS — R262 Difficulty in walking, not elsewhere classified: Secondary | ICD-10-CM

## 2021-10-08 DIAGNOSIS — R600 Localized edema: Secondary | ICD-10-CM

## 2021-10-08 NOTE — Therapy (Signed)
OUTPATIENT PHYSICAL THERAPY LOWER EXTREMITY      Patient Name: Jeanette Rodriguez MRN: [id number] DOB:1956/02/19, 66 y.o., female Today's Date: 10/08/2021   PT End of Session - 10/08/21 0942     Visit Number 13    Number of Visits 21    Date for PT Re-Evaluation 10/15/21    Authorization Type MCR    Progress Note Due on Visit 20    PT Start Time 0931    PT Stop Time 1015    PT Time Calculation (min) 44 min    Activity Tolerance Patient tolerated treatment well    Behavior During Therapy WFL for tasks assessed/performed                 Past Medical History:  Diagnosis Date   Anxiety    Arthritis    Asthma    Depression    Fibromyalgia    GERD (gastroesophageal reflux disease)    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 [place]   Hypertension    Pneumonia    Past Surgical History:  Procedure Laterality Date   ANKLE SURGERY     x3 right   2007, 2009,2011   TONSILLECTOMY     TOTAL KNEE ARTHROPLASTY Right 08/03/2020   Procedure: TOTAL KNEE ARTHROPLASTY;  Surgeon: Ollen Gross, MD;  Location: [facility];  Service: Orthopedics;  Laterality: Right;    TOTAL KNEE ARTHROPLASTY Left 08/02/2021   Procedure: TOTAL KNEE ARTHROPLASTY;  Surgeon: Ollen Gross, MD;  Location: [facility];  Service: Orthopedics;  Laterality: Left;   Patient Active Problem List   Diagnosis Date Noted   Osteoarthritis of left knee 08/02/2021   Primary osteoarthritis of knee 08/04/2020   OA (osteoarthritis) of knee 08/03/2020    PCP: Mila Palmer, MD  REFERRING PROVIDER: Ollen Gross, MD  REFERRING DIAG: [phone] (ICD-10-CM) - Presence of left artificial knee joint  THERAPY DIAG:  Acute pain of left knee  Stiffness of left knee, not elsewhere classified  Difficulty in walking, not elsewhere classified  Localized edema  Rationale for Evaluation and Treatment Rehabilitation  ONSET DATE: DOS 08/02/21  SUBJECTIVE:   SUBJECTIVE STATEMENT: the back of my knee  hurts  PERTINENT HISTORY: none  PAIN:  Are you having pain? Yes: NPRS scale: 6/10 current Pain location: Lt knee, posterior  Pain description: ache, sharp Aggravating factors: constant Relieving factors: rest, meds, ice  PRECAUTIONS: None  WEIGHT BEARING RESTRICTIONS No  FALLS:  Has patient fallen in last 6 months? No  LIVING ENVIRONMENT: Lives with: lives with their family and lives with their spouse Lives in: House/apartment  PLOF: Independent  PATIENT GOALS decrease pain, walk   OBJECTIVE:    PATIENT SURVEYS:  FOTO 26 09/21/21: 52  COGNITION:  Overall cognitive status: Within functional limits for tasks assessed     EDEMA:  Circumferential: 46.5 cm 09/21/21 44.0 cm      LOWER EXTREMITY ROM:  Passive ROM Left Eval - 6/16 Left 6/22 LEFT 7/6 LEFT 7/10 Left 8/1  Knee flexion 56 85 92 100 112  Knee extension -10 -6 0 0 0  Ankle dorsiflexion       Ankle plantarflexion        (Blank rows = not tested)  LOWER EXTREMITY MMT:  Lacking quad activation on left side   GAIT: 7/5- no AD, mild antalgic gait but able to reduce with cues to avoid circumduction    TODAY'S TREATMENT:  8/18 Nu step 7 min IASTM Lt hamstrings and  gastroc Shuttle: double leg press 2x25, Lt leg press 25+12 Slant board 3*30s     PATIENT EDUCATION:  Education details: Anatomy of condition, POC, HEP, exercise form/rationale  Person educated: Patient Education method: Explanation, Demonstration, Tactile cues, Verbal cues, and Handouts Education comprehension: verbalized understanding, returned demonstration, verbal cues required, tactile cues required, and needs further education   HOME EXERCISE PROGRAM: [email or web address] Added aquatics 09/23/21  ASSESSMENT:  CLINICAL IMPRESSION: Bakers cyst noted bilaterally and creating significant discomfort on Lt side. LT gastroc soleus is very tight and she still has a tendency to avoid knee flexion in swing through. Will work on these  things to help move fluid.     OBJECTIVE IMPAIRMENTS Abnormal gait, decreased activity tolerance, decreased balance, decreased mobility, difficulty walking, decreased ROM, decreased strength, increased edema, increased muscle spasms, impaired flexibility, impaired sensation, improper body mechanics, and pain.   ACTIVITY LIMITATIONS standing, squatting, stairs, transfers, bed mobility, and locomotion level  PARTICIPATION LIMITATIONS: meal prep, cleaning, driving, shopping, and yard work  PERSONAL FACTORS  current vertigo  are also affecting patient's functional outcome.   REHAB POTENTIAL: Good  CLINICAL DECISION MAKING: Stable/uncomplicated  EVALUATION COMPLEXITY: Low   GOALS: Goals reviewed with patient? Yes  SHORT TERM GOALS: Target date: 09/02/2021  ROM 0-120 Baseline:112 on 09/22/18 Goal status: ongoing  2.  Ambulation without AD in household distances Baseline:  Goal status: achieved   LONG TERM GOALS: Target date: 10/14/2021   Able to ambulate through daily activities with heel toe gait and without AD Baseline:  Goal status: ongoing  2.  Pt will return to regular exercise program for long term strength Baseline:  Goal status: Ongoing  3.  Pt will meet FOTO goal Baseline: 26 Goal status: ongoing  4.  Able to return to yard work without limitation by knee pain Baseline:  Goal status: Achieved     PLAN: PT FREQUENCY: 2x/week  PT DURATION: 10 weeks  PLANNED INTERVENTIONS: Therapeutic exercises, Therapeutic activity, Neuromuscular re-education, Balance training, Gait training, Patient/Family education, Joint mobilization, Stair training, Aquatic Therapy, Dry Needling, Electrical stimulation, Cryotherapy, Moist heat, Taping, Manual therapy, and Re-evaluation  PLAN FOR NEXT SESSION: cont gastroc STM, step ups  Torsten Weniger C. Masaki Rothbauer PT, DPT 10/08/21 10:24 AM

## 2021-10-13 ENCOUNTER — Ambulatory Visit (INDEPENDENT_AMBULATORY_CARE_PROVIDER_SITE_OTHER): Payer: Medicare Other | Admitting: Psychiatry

## 2021-10-13 ENCOUNTER — Encounter (HOSPITAL_BASED_OUTPATIENT_CLINIC_OR_DEPARTMENT_OTHER): Payer: Self-pay | Admitting: Physical Therapy

## 2021-10-13 ENCOUNTER — Ambulatory Visit (HOSPITAL_BASED_OUTPATIENT_CLINIC_OR_DEPARTMENT_OTHER): Payer: Medicare Other | Admitting: Physical Therapy

## 2021-10-13 VITALS — BP 130/78 | HR 75 | Ht 61.5 in | Wt 164.5 lb

## 2021-10-13 DIAGNOSIS — M25562 Pain in left knee: Secondary | ICD-10-CM

## 2021-10-13 DIAGNOSIS — R42 Dizziness and giddiness: Secondary | ICD-10-CM | POA: Diagnosis not present

## 2021-10-13 DIAGNOSIS — M25662 Stiffness of left knee, not elsewhere classified: Secondary | ICD-10-CM

## 2021-10-13 DIAGNOSIS — R262 Difficulty in walking, not elsewhere classified: Secondary | ICD-10-CM

## 2021-10-13 NOTE — Therapy (Signed)
OUTPATIENT PHYSICAL THERAPY LOWER EXTREMITY      Patient Name: Jeanette Rodriguez MRN: [id number] DOB:1955/06/02, 66 y.o., female Today's Date: 10/13/2021   PT End of Session - 10/13/21 0931     Visit Number 14    Number of Visits 21    Date for PT Re-Evaluation 10/15/21    Authorization Type MCR    Progress Note Due on Visit 20    PT Start Time 0930    PT Stop Time 1015    PT Time Calculation (min) 45 min    Activity Tolerance Patient tolerated treatment well    Behavior During Therapy WFL for tasks assessed/performed                 Past Medical History:  Diagnosis Date   Anxiety    Arthritis    Asthma    Depression    Fibromyalgia    GERD (gastroesophageal reflux disease)    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 [place]   Hypertension    Pneumonia    Past Surgical History:  Procedure Laterality Date   ANKLE SURGERY     x3 right   2007, 2009,2011   TONSILLECTOMY     TOTAL KNEE ARTHROPLASTY Right 08/03/2020   Procedure: TOTAL KNEE ARTHROPLASTY;  Surgeon: Ollen Gross, MD;  Location: [facility];  Service: Orthopedics;  Laterality: Right;    TOTAL KNEE ARTHROPLASTY Left 08/02/2021   Procedure: TOTAL KNEE ARTHROPLASTY;  Surgeon: Ollen Gross, MD;  Location: [facility];  Service: Orthopedics;  Laterality: Left;   Patient Active Problem List   Diagnosis Date Noted   Osteoarthritis of left knee 08/02/2021   Primary osteoarthritis of knee 08/04/2020   OA (osteoarthritis) of knee 08/03/2020    PCP: Mila Palmer, MD  REFERRING PROVIDER: Ollen Gross, MD  REFERRING DIAG: [phone] (ICD-10-CM) - Presence of left artificial knee joint  THERAPY DIAG:  Acute pain of left knee  Stiffness of left knee, not elsewhere classified  Difficulty in walking, not elsewhere classified  Rationale for Evaluation and Treatment Rehabilitation  ONSET DATE: DOS 08/02/21  SUBJECTIVE:   SUBJECTIVE STATEMENT: not too bad today, hard time putting on shoes.    PERTINENT HISTORY: none  PAIN:  Are you having pain? Yes: NPRS scale: 4/10 current Pain location: Lt knee, posterior  Pain description: ache, sharp Aggravating factors: constant Relieving factors: rest, meds, ice  PRECAUTIONS: None  WEIGHT BEARING RESTRICTIONS No  FALLS:  Has patient fallen in last 6 months? No  LIVING ENVIRONMENT: Lives with: lives with their family and lives with their spouse Lives in: House/apartment  PLOF: Independent  PATIENT GOALS decrease pain, walk   OBJECTIVE:    PATIENT SURVEYS:  FOTO 26 09/21/21: 52  COGNITION:  Overall cognitive status: Within functional limits for tasks assessed     EDEMA:  Circumferential: 46.5 cm 09/21/21 44.0 cm    LOWER EXTREMITY ROM:  Passive ROM Left Eval - 6/16 Left 6/22 LEFT 7/6 LEFT 7/10 Left 8/1 LEFT 8/23  Knee flexion 56 85 92 100 112 90 Beg of session  Knee extension -10 -6 0 0 0   Ankle dorsiflexion        Ankle plantarflexion         (Blank rows = not tested)  LOWER EXTREMITY MMT:  Lacking quad activation on left side   GAIT: 7/5- no AD, mild antalgic gait but able to reduce with cues to avoid circumduction    TODAY'S TREATMENT:  8/23  IASTM gastroc & hamstrings Recumb bike 5 min Prone, alternating Hs curls x10 each Sidelying hip abduction 3x15 Lateral step ups 4" Heel raises on step  THERACT: stair training  8/18 Nu step 7 min IASTM Lt hamstrings and gastroc Shuttle: double leg press 2x25, Lt leg press 25+12 Slant board 3*30s     PATIENT EDUCATION:  Education details: Anatomy of condition, POC, HEP, exercise form/rationale  Person educated: Patient Education method: Explanation, Demonstration, Tactile cues, Verbal cues, and Handouts Education comprehension: verbalized understanding, returned demonstration, verbal cues required, tactile cues required, and needs further education   HOME EXERCISE PROGRAM: [email or web address] Added aquatics  09/23/21  ASSESSMENT:  CLINICAL IMPRESSION: 6 deg increase in flexion ROM with IASTM before doing any warmup. Knee flexion to 100 on bike but pt became dizzy during bike.     OBJECTIVE IMPAIRMENTS Abnormal gait, decreased activity tolerance, decreased balance, decreased mobility, difficulty walking, decreased ROM, decreased strength, increased edema, increased muscle spasms, impaired flexibility, impaired sensation, improper body mechanics, and pain.   ACTIVITY LIMITATIONS standing, squatting, stairs, transfers, bed mobility, and locomotion level  PARTICIPATION LIMITATIONS: meal prep, cleaning, driving, shopping, and yard work  PERSONAL FACTORS  current vertigo  are also affecting patient's functional outcome.   REHAB POTENTIAL: Good  CLINICAL DECISION MAKING: Stable/uncomplicated  EVALUATION COMPLEXITY: Low   GOALS: Goals reviewed with patient? Yes  SHORT TERM GOALS: Target date: 09/02/2021  ROM 0-120 Baseline:112 on 09/22/18 Goal status: ongoing  2.  Ambulation without AD in household distances Baseline:  Goal status: achieved   LONG TERM GOALS: Target date: 10/14/2021   Able to ambulate through daily activities with heel toe gait and without AD Baseline:  Goal status: ongoing  2.  Pt will return to regular exercise program for long term strength Baseline:  Goal status: Ongoing  3.  Pt will meet FOTO goal Baseline: 26 Goal status: ongoing  4.  Able to return to yard work without limitation by knee pain Baseline:  Goal status: Achieved     PLAN: PT FREQUENCY: 2x/week  PT DURATION: 10 weeks  PLANNED INTERVENTIONS: Therapeutic exercises, Therapeutic activity, Neuromuscular re-education, Balance training, Gait training, Patient/Family education, Joint mobilization, Stair training, Aquatic Therapy, Dry Needling, Electrical stimulation, Cryotherapy, Moist heat, Taping, Manual therapy, and Re-evaluation  PLAN FOR NEXT SESSION: cont aggressive flexion  ROM  Henretta Quist C. Jatavian Calica PT, DPT 10/13/21 10:16 AM

## 2021-10-13 NOTE — Progress Notes (Signed)
[facility]  PATIENT: Jeanette Rodriguez DOB: 1955-11-17  REFERRING CLINICIAN: Camie Patience, FNP HISTORY FROM: self REASON FOR VISIT: vertigo   HISTORICAL  CHIEF COMPLAINT:  Chief Complaint  Patient presents with   New Patient (Initial Visit)    Pt reports feeling okay. She states she just came from physical therapy for her knee so she is a bit tired. She reports feeling dizzy and unable to get up and walk. She states she was treated with antibiotics that gave her no relief. Room 1 alone    HISTORY OF PRESENT ILLNESS:  66 year old female who presents for evaluation of vertigo which began in May 2023. She had intermittent vertigo from May through July. Vertigo was associated with pressure in her left ear and mild muffled hearing. States she could feel fluid moving in her ear. Vertigo would be triggered every time she moved, and was worse when lying in bed. It would last for a few seconds at a time. She was found to have a sinus infection and was treated with multiple antibiotics. She was also given meclizine which was ineffective. Initially vertigo did not improve with antibiotics. However it resolved in July and not returned this month.   OTHER MEDICAL CONDITIONS: osteoarthritis, HLD, HTN, anxiety   REVIEW OF SYSTEMS: Full 14 system review of systems performed and negative with exception of: vertigo  ALLERGIES: Allergies  Allergen Reactions   Levaquin [Levofloxacin]     Leg swelling    HOME MEDICATIONS: Outpatient Medications Prior to Visit  Medication Sig Dispense Refill   cetirizine (ZYRTEC) 10 MG tablet 1 tablet Orally Once a day 90 days 100 tablet 0   fluticasone (FLONASE) 50 MCG/ACT nasal spray 1 spray in each nostril Nasally Once a day 90 days 48 g 3   gabapentin (NEURONTIN) 100 MG capsule 1 capsule Orally four times a day 90 days (Patient taking differently: Take 100 mg by mouth 3 (three) times daily.) 360 capsule 1   hydrochlorothiazide  (HYDRODIURIL) 25 MG tablet Take 25 mg by mouth daily.     Magnesium 500 MG TABS Take 500 mg by mouth daily.     methocarbamol (ROBAXIN) 500 MG tablet 1 tablet by mouth every 6 hours as needed for muscle spasms 40 tablet 0   montelukast (SINGULAIR) 10 MG tablet Take 10 mg by mouth at bedtime.     oxyCODONE-acetaminophen (PERCOCET) 10-325 MG tablet Take 1 tablet by mouth three times a day as needed 90 tablet 0   Semaglutide, 2 MG/DOSE, (OZEMPIC, 2 MG/DOSE,) 8 MG/3ML SOPN 2MG  Subcutaneous Once a week 90 days 9 mL 3   HYDROmorphone (DILAUDID) 2 MG tablet Take 1-2 tablets (2-4 mg total) by mouth every 6 (six) hours as needed for severe pain. 42 tablet 0   meclizine (ANTIVERT) 25 MG tablet Take 1 tablet by mouth every 12 hrs as needed (Patient not taking: Reported on 10/13/2021) 15 tablet 0   methocarbamol (ROBAXIN) 500 MG tablet Take 1 tablet (500 mg total) by mouth every 6 (six) hours as needed for muscle spasms. 40 tablet 0   methocarbamol (ROBAXIN) 500 MG tablet 1 tablet by mouth every 6 hours as needed for muscle spasms 40 tablet 0   traMADol (ULTRAM) 50 MG tablet Take 1-2 tablets (50-100 mg total) by mouth every 6 (six) hours as needed for moderate pain. 40 tablet 0   gabapentin (NEURONTIN) 100 MG capsule Take 1 capsule by mouth four times a day for 90 days 360 capsule 1  hydrochlorothiazide (HYDRODIURIL) 25 MG tablet 1 tablet in the morning Orally Once a day 90 days 90 tablet 3   montelukast (SINGULAIR) 10 MG tablet Take 1 tablet by mouth once daily. 90 tablet 0   Semaglutide, 2 MG/DOSE, (OZEMPIC, 2 MG/DOSE,) 8 MG/3ML SOPN Inject 2 milligrams under the skin once weekly 6 mL 2   No facility-administered medications prior to visit.    PAST MEDICAL HISTORY: Past Medical History:  Diagnosis Date   Anxiety    Arthritis    Asthma    Depression    Fibromyalgia    GERD (gastroesophageal reflux disease)    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 [place]   Hypertension     Pneumonia     PAST SURGICAL HISTORY: Past Surgical History:  Procedure Laterality Date   ANKLE SURGERY     x3 right   2007, 2009,2011   TONSILLECTOMY     TOTAL KNEE ARTHROPLASTY Right 08/03/2020   Procedure: TOTAL KNEE ARTHROPLASTY;  Surgeon: Ollen Gross, MD;  Location: [facility];  Service: Orthopedics;  Laterality: Right;    TOTAL KNEE ARTHROPLASTY Left 08/02/2021   Procedure: TOTAL KNEE ARTHROPLASTY;  Surgeon: Ollen Gross, MD;  Location: [facility];  Service: Orthopedics;  Laterality: Left;    FAMILY HISTORY: Mother had a brain tumor  SOCIAL HISTORY: Social History   Socioeconomic History   Marital status: Married    Spouse name: Not on file   Number of children: Not on file   Years of education: Not on file   Highest education level: Not on file  Occupational History   Not on file  Tobacco Use   Smoking status: Former    Years: 10.00    Types: Cigarettes   Smokeless tobacco: Never   Tobacco comments:    quit 2015  Vaping Use   Vaping Use: Never used  Substance and Sexual Activity   Alcohol use: No    Comment: rarely   Drug use: Never   Sexual activity: Not Currently  Other Topics Concern   Not on file  Social History Narrative   Not on file   Social Determinants of Health   Financial Resource Strain: Not on file  Food Insecurity: Not on file  Transportation Needs: Not on file  Physical Activity: Not on file  Stress: Not on file  Social Connections: Not on file  Intimate Partner Violence: Not on file     PHYSICAL EXAM  GENERAL EXAM/CONSTITUTIONAL: Vitals:  Vitals:   10/13/21 1250  BP: 130/78  Pulse: 75  Weight: 164 lb 8 oz (74.6 kg)  Height: 5' 1.5" (1.562 m)   Body mass index is 30.58 kg/m. Wt Readings from Last 3 Encounters:  10/13/21 164 lb 8 oz (74.6 kg)  08/02/21 168 lb (76.2 kg)  07/21/21 168 lb (76.2 kg)    NEUROLOGIC: MENTAL STATUS:  awake, alert, oriented to person, place and time recent and remote memory intact normal  attention and concentration  CRANIAL NERVE:  2nd, 3rd, 4th, 6th - pupils equal and reactive to light, visual fields full to confrontation, extraocular muscles intact, no nystagmus 5th - facial sensation symmetric 7th - facial strength symmetric 8th - hearing intact 9th - palate elevates symmetrically, uvula midline 11th - shoulder shrug symmetric 12th - tongue protrusion midline  MOTOR:  normal bulk and tone, full strength in the BUE, BLE  SENSORY:  Decreased sensation RUE (baseline from cervical radiculopathy and carpal tunnel), otherwise sensation intact to light  touch throughout  COORDINATION:  finger-nose-finger intact bilaterally  REFLEXES:  deep tendon reflexes present and symmetric  GAIT/STATION:  Walks with right limp due to recent right knee surgery  Dix Hallpike negative   DIAGNOSTIC DATA (LABS, IMAGING, TESTING) - I reviewed patient records, labs, notes, testing and imaging myself where available.  Lab Results  Component Value Date   WBC 15.6 (H) 08/03/2021   HGB 11.7 (L) 08/03/2021   HCT 35.8 (L) 08/03/2021   MCV 91.1 08/03/2021   PLT 406 (H) 08/03/2021      Component Value Date/Time   NA 143 08/03/2021 0323   K 3.0 (L) 08/03/2021 0323   CL 111 08/03/2021 0323   CO2 26 08/03/2021 0323   GLUCOSE 135 (H) 08/03/2021 0323   BUN 18 08/03/2021 0323   CREATININE 0.75 08/03/2021 0323   CALCIUM 8.6 (L) 08/03/2021 0323   PROT 7.3 07/27/2020 0910   ALBUMIN 4.4 07/27/2020 0910   AST 18 07/27/2020 0910   ALT 22 07/27/2020 0910   ALKPHOS 56 07/27/2020 0910   BILITOT 0.7 07/27/2020 0910   GFRNONAA >60 08/03/2021 0323     ASSESSMENT AND PLAN  66 y.o. year old female with a history of osteoarthritis, HLD, HTN, anxiety who presents for evaluation of vertigo. Neurological exam today is normal other than some baseline decreased sensation from cervical radiculopathy and carpal tunnel. Suspect this was related to her sinus infection as it has resolved after  treatment with antibiotics. She does not have vertigo today and vertigo/nystagmus is not induced by Dix-Hallpike maneuver. Will have her return to clinic if vertigo reoccurs.   1. Vertigo      PLAN: -Return to clinic if vertigo reoccurs   Return if symptoms worsen or fail to improve.    Ocie Doyne, MD 8/32/23 1:28 PM  I spent an average of 33 minutes chart reviewing and counseling the patient, with at least 50% of the time face to face with the patient.   [facility] [address] [place], [place] [address] [phone]

## 2021-10-14 ENCOUNTER — Encounter (HOSPITAL_BASED_OUTPATIENT_CLINIC_OR_DEPARTMENT_OTHER): Payer: Self-pay | Admitting: Pharmacist

## 2021-10-14 ENCOUNTER — Other Ambulatory Visit (HOSPITAL_BASED_OUTPATIENT_CLINIC_OR_DEPARTMENT_OTHER): Payer: Self-pay

## 2021-10-14 MED ORDER — OXYCODONE-ACETAMINOPHEN 5-325 MG PO TABS
1.0000 | ORAL_TABLET | Freq: Three times a day (TID) | ORAL | 0 refills | Status: DC | PRN
  Filled 2022-02-17: qty 10, 4d supply, fill #0

## 2021-10-15 ENCOUNTER — Ambulatory Visit (HOSPITAL_BASED_OUTPATIENT_CLINIC_OR_DEPARTMENT_OTHER): Payer: Medicare Other | Admitting: Physical Therapy

## 2021-10-15 ENCOUNTER — Encounter (HOSPITAL_BASED_OUTPATIENT_CLINIC_OR_DEPARTMENT_OTHER): Payer: Self-pay | Admitting: Physical Therapy

## 2021-10-15 ENCOUNTER — Other Ambulatory Visit (HOSPITAL_BASED_OUTPATIENT_CLINIC_OR_DEPARTMENT_OTHER): Payer: Self-pay

## 2021-10-15 DIAGNOSIS — M25662 Stiffness of left knee, not elsewhere classified: Secondary | ICD-10-CM

## 2021-10-15 DIAGNOSIS — R262 Difficulty in walking, not elsewhere classified: Secondary | ICD-10-CM

## 2021-10-15 DIAGNOSIS — M25562 Pain in left knee: Secondary | ICD-10-CM | POA: Diagnosis not present

## 2021-10-15 DIAGNOSIS — R600 Localized edema: Secondary | ICD-10-CM

## 2021-10-15 NOTE — Therapy (Signed)
OUTPATIENT PHYSICAL THERAPY LOWER EXTREMITY RECERT     Patient Name: Jeanette Rodriguez MRN: [id number] DOB:[birth year], 66 y.o., female Today's Date: 10/15/2021   PT End of Session - 10/15/21 1011     Visit Number 15    Number of Visits 27    Date for PT Re-Evaluation 11/17/21    Authorization Type MCR    Progress Note Due on Visit 20    PT Start Time 0931    PT Stop Time 1011    PT Time Calculation (min) 40 min    Activity Tolerance Patient tolerated treatment well    Behavior During Therapy WFL for tasks assessed/performed                  Past Medical History:  Diagnosis Date   Anxiety    Arthritis    Asthma    Depression    Fibromyalgia    GERD (gastroesophageal reflux disease)    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 [place]   Hypertension    Pneumonia    Past Surgical History:  Procedure Laterality Date   ANKLE SURGERY     x3 right   2007, 2009,2011   TONSILLECTOMY     TOTAL KNEE ARTHROPLASTY Right 08/03/2020   Procedure: TOTAL KNEE ARTHROPLASTY;  Surgeon: Gaynelle Arabian, MD;  Location: [facility];  Service: Orthopedics;  Laterality: Right;  87min   TOTAL KNEE ARTHROPLASTY Left 08/02/2021   Procedure: TOTAL KNEE ARTHROPLASTY;  Surgeon: Gaynelle Arabian, MD;  Location: [facility];  Service: Orthopedics;  Laterality: Left;   Patient Active Problem List   Diagnosis Date Noted   Osteoarthritis of left knee 08/02/2021   Primary osteoarthritis of knee 08/04/2020   OA (osteoarthritis) of knee 08/03/2020    PCP: Jonathon Jordan, MD  REFERRING PROVIDER: Gaynelle Arabian, MD  REFERRING DIAG: [phone] (ICD-10-CM) - Presence of left artificial knee joint  THERAPY DIAG:  Acute pain of left knee - Plan: PT plan of care cert/re-cert  Stiffness of left knee, not elsewhere classified - Plan: PT plan of care cert/re-cert  Difficulty in walking, not elsewhere classified - Plan: PT plan of care cert/re-cert  Localized edema - Plan: PT plan of care  cert/re-cert  Rationale for Evaluation and Treatment Rehabilitation  ONSET DATE: DOS 08/02/21  SUBJECTIVE:   SUBJECTIVE STATEMENT: things are going alright, not great, the cyst behind my knee is still causing me a lot of issues and keeps me from going up stairs and things like that. Knee buckles because of the cyst. Doctor thinks I need to build some more strength.   PERTINENT HISTORY: none  PAIN:  Are you having pain? Yes: NPRS scale: 5/10 current Pain location: Lt knee, posterior  Pain description: ache, sharp Aggravating factors: up and on it  Relieving factors: heat and ice   PRECAUTIONS: None  WEIGHT BEARING RESTRICTIONS No  FALLS:  Has patient fallen in last 6 months? No  LIVING ENVIRONMENT: Lives with: lives with their family and lives with their spouse Lives in: House/apartment  PLOF: Independent  PATIENT GOALS decrease pain, walk   OBJECTIVE:    PATIENT SURVEYS:  FOTO 26 09/21/21: 52 8/25: 50.1  COGNITION:  Overall cognitive status: Within functional limits for tasks assessed     EDEMA:  Circumferential: 46.5 cm 09/21/21 44.0 cm    LOWER EXTREMITY ROM:  Passive ROM Left Eval - 6/16 Left 6/22 LEFT 7/6 LEFT 7/10 Left 8/1 LEFT 8/23 L 8/25  Knee flexion 56 85  92 100 112 90 Beg of session 90 beginning of session; 94 at best today due to pain   Knee extension -10 -6 0 0 0    Ankle dorsiflexion         Ankle plantarflexion          (Blank rows = not tested)  LOWER EXTREMITY MMT:  Lacking quad activation on left side                    8/25                   Ankle DF: 5/5 B                   Knee extension: R 5/5 L 4+/5                    Knee flexion: R 4/5 L 3/5                    Hip flexion: R 4+/5, L 4/5     GAIT: 7/5- no AD, mild antalgic gait but able to reduce with cues to avoid circumduction    TODAY'S TREATMENT:  8/25  Objective measures- ROM/MMT and goal review, education as appropriate   SciFit bike seat 8, partial  rotations to tolerance Shuttle: Double leg press 25# x15 then 50# x15; single leg press L LE 25# 1x10 then 37# 1x8 Prone HS curls 2x10 B  Self knee flexion stretch sitting 10x5 second holds    8/23  IASTM gastroc & hamstrings Recumb bike 5 min Prone, alternating Hs curls x10 each Sidelying hip abduction 3x15 Lateral step ups 4" Heel raises on step  THERACT: stair training  8/18 Nu step 7 min IASTM Lt hamstrings and gastroc Shuttle: double leg press 2x25, Lt leg press 25+12 Slant board 3*30s     PATIENT EDUCATION:  Education details: Geophysicist/field seismologist of condition, POC, HEP, exercise form/rationale  Person educated: Patient Education method: Explanation, Demonstration, Tactile cues, Verbal cues, and Handouts Education comprehension: verbalized understanding, returned demonstration, verbal cues required, tactile cues required, and needs further education   HOME EXERCISE PROGRAM: [email or web address] Added aquatics 09/23/21  ASSESSMENT:  CLINICAL IMPRESSION:   Jeanette Rodriguez arrives today doing OK, still having a lot of limitations with L knee flexion and buckling. Took updated objective measures for recert, otherwise continued working on aggressive flexion ROM as able and tolerated today. Only able to get to 90 degrees flexion at EOS. Will benefit from continuation of skilled PT services to keep working on functional strength, ROM, and to improve functional task performance as well as to reduce buckling/reduce fall risk during her regular activities.     OBJECTIVE IMPAIRMENTS Abnormal gait, decreased activity tolerance, decreased balance, decreased mobility, difficulty walking, decreased ROM, decreased strength, increased edema, increased muscle spasms, impaired flexibility, impaired sensation, improper body mechanics, and pain.   ACTIVITY LIMITATIONS standing, squatting, stairs, transfers, bed mobility, and locomotion level  PARTICIPATION LIMITATIONS: meal prep, cleaning, driving, shopping, and yard  work  PERSONAL FACTORS  current vertigo  are also affecting patient's functional outcome.   REHAB POTENTIAL: Good  CLINICAL DECISION MAKING: Stable/uncomplicated  EVALUATION COMPLEXITY: Low   GOALS: Goals reviewed with patient? Yes  SHORT TERM GOALS: Target date: 09/02/2021  ROM 0-120 Baseline:112 on 09/22/18 Goal status: ongoing  2.  Ambulation without AD in household distances Baseline:  Goal status: achieved   LONG TERM GOALS: Target date: 10/14/2021   Able to ambulate  through daily activities with heel toe gait and without AD Baseline: 8/25- depends on tightness of knee, varies  Goal status: ongoing  2.  Pt will return to regular exercise program for long term strength Baseline: 8/25- not there yet  Goal status: Ongoing  3.  Pt will meet FOTO goal Baseline: 8/25-50 Goal status: ongoing  4.  Able to return to yard work without limitation by knee pain Baseline: 8/25- varies  Goal status: Partially Met      PLAN: PT FREQUENCY: 2x/week  PT DURATION: other: 5 weeks  PLANNED INTERVENTIONS: Therapeutic exercises, Therapeutic activity, Neuromuscular re-education, Balance training, Gait training, Patient/Family education, Joint mobilization, Stair training, Aquatic Therapy, Dry Needling, Electrical stimulation, Cryotherapy, Moist heat, Taping, Manual therapy, and Re-evaluation  PLAN FOR NEXT SESSION: cont aggressive flexion ROM   Armondo Cech U PT DPT PN2  10/15/2021, 12:13 PM

## 2021-10-18 ENCOUNTER — Other Ambulatory Visit (HOSPITAL_BASED_OUTPATIENT_CLINIC_OR_DEPARTMENT_OTHER): Payer: Self-pay

## 2021-10-20 ENCOUNTER — Encounter (HOSPITAL_BASED_OUTPATIENT_CLINIC_OR_DEPARTMENT_OTHER): Payer: Self-pay | Admitting: Physical Therapy

## 2021-10-20 ENCOUNTER — Ambulatory Visit (HOSPITAL_BASED_OUTPATIENT_CLINIC_OR_DEPARTMENT_OTHER): Payer: Medicare Other | Admitting: Physical Therapy

## 2021-10-20 DIAGNOSIS — M25562 Pain in left knee: Secondary | ICD-10-CM

## 2021-10-20 DIAGNOSIS — R600 Localized edema: Secondary | ICD-10-CM

## 2021-10-20 DIAGNOSIS — R262 Difficulty in walking, not elsewhere classified: Secondary | ICD-10-CM

## 2021-10-20 DIAGNOSIS — M25662 Stiffness of left knee, not elsewhere classified: Secondary | ICD-10-CM

## 2021-10-20 NOTE — Therapy (Signed)
OUTPATIENT PHYSICAL THERAPY LOWER EXTREMITY RECERT     Patient Name: Jeanette Rodriguez MRN: [id number] DOB:[birth year], 66 y.o., female Today's Date: 10/20/2021   PT End of Session - 10/20/21 0808     Visit Number 16    Number of Visits 27    Date for PT Re-Evaluation 11/17/21    Authorization Type MCR    PT Start Time 0803    PT Stop Time 0845    PT Time Calculation (min) 42 min    Activity Tolerance Patient tolerated treatment well    Behavior During Therapy WFL for tasks assessed/performed                   Past Medical History:  Diagnosis Date   Anxiety    Arthritis    Asthma    Depression    Fibromyalgia    GERD (gastroesophageal reflux disease)    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 [place]   Hypertension    Pneumonia    Past Surgical History:  Procedure Laterality Date   ANKLE SURGERY     x3 right   2007, 2009,2011   TONSILLECTOMY     TOTAL KNEE ARTHROPLASTY Right 08/03/2020   Procedure: TOTAL KNEE ARTHROPLASTY;  Surgeon: Gaynelle Arabian, MD;  Location: [facility];  Service: Orthopedics;  Laterality: Right;  2mn   TOTAL KNEE ARTHROPLASTY Left 08/02/2021   Procedure: TOTAL KNEE ARTHROPLASTY;  Surgeon: AGaynelle Arabian MD;  Location: [facility];  Service: Orthopedics;  Laterality: Left;   Patient Active Problem List   Diagnosis Date Noted   Osteoarthritis of left knee 08/02/2021   Primary osteoarthritis of knee 08/04/2020   OA (osteoarthritis) of knee 08/03/2020    PCP: SJonathon Jordan MD  REFERRING PROVIDER: AGaynelle Arabian MD  REFERRING DIAG: [phone](ICD-10-CM) - Presence of left artificial knee joint  THERAPY DIAG:  Acute pain of left knee  Stiffness of left knee, not elsewhere classified  Difficulty in walking, not elsewhere classified  Localized edema  Rationale for Evaluation and Treatment Rehabilitation  ONSET DATE: DOS 08/02/21  SUBJECTIVE:   SUBJECTIVE STATEMENT:  The patient comes in today not putting much  weight on the right leg. She reports she feels like that leg is loosing ROM. Seh continues to have pain in her posterior knee.   PERTINENT HISTORY: none  PAIN:  Are you having pain? Yes: NPRS scale: 5/10 current 8/30 Pain location: Lt knee, posterior  Pain description: ache, sharp Aggravating factors: up and on it  Relieving factors: heat and ice   PRECAUTIONS: None  WEIGHT BEARING RESTRICTIONS No  FALLS:  Has patient fallen in last 6 months? No  LIVING ENVIRONMENT: Lives with: lives with their family and lives with their spouse Lives in: House/apartment  PLOF: Independent  PATIENT GOALS decrease pain, walk   OBJECTIVE:    PATIENT SURVEYS:  FOTO 26 09/21/21: 52 8/25: 50.1  COGNITION:  Overall cognitive status: Within functional limits for tasks assessed     EDEMA:  Circumferential: 46.5 cm 09/21/21 44.0 cm    LOWER EXTREMITY ROM:  Passive ROM Left Eval - 6/16 Left 6/22 LEFT 7/6 LEFT 7/10 Left 8/1 LEFT 8/23 L 8/25 L 8/30  Knee flexion 56 85 92 100 112 90 Beg of session 90 beginning of session; 94 at best today due to pain  103   Knee extension -10 -6 0 0 0     Ankle dorsiflexion          Ankle  plantarflexion           (Blank rows = not tested)  LOWER EXTREMITY MMT:  Lacking quad activation on left side                    8/25                   Ankle DF: 5/5 B                   Knee extension: R 5/5 L 4+/5                    Knee flexion: R 4/5 L 3/5                    Hip flexion: R 4+/5, L 4/5     GAIT: 7/5- no AD, mild antalgic gait but able to reduce with cues to avoid circumduction    TODAY'S TREATMENT: 8/30 Grade II and III PA and AP mobilizations of the knee; PROM into flexion  SAQ 3x10  SLR 3x10            Shuttle: Double leg press 25# x15 then 50# x15; single leg press L LE 25# 1x10 then 37# 1x8 Step ups 4 icn 2x10  Side steps 2x10  Step downs 2 inch 2x10     8/25  Objective measures- ROM/MMT and goal review, education  as appropriate   SciFit bike seat 8, partial rotations to tolerance Shuttle: Double leg press 25# x15 then 50# x15; single leg press L LE 25# 1x10 then 37# 1x8 Prone HS curls 2x10 B  Self knee flexion stretch sitting 10x5 second holds    8/23  IASTM gastroc & hamstrings Recumb bike 5 min Prone, alternating Hs curls x10 each Sidelying hip abduction 3x15 Lateral step ups 4" Heel raises on step  THERACT: stair training  8/18 Nu step 7 min IASTM Lt hamstrings and gastroc Shuttle: double leg press 2x25, Lt leg press 25+12 Slant board 3*30s     PATIENT EDUCATION:  Education details: Geophysicist/field seismologist of condition, POC, HEP, exercise form/rationale  Person educated: Patient Education method: Explanation, Demonstration, Tactile cues, Verbal cues, and Handouts Education comprehension: verbalized understanding, returned demonstration, verbal cues required, tactile cues required, and needs further education   HOME EXERCISE PROGRAM: [email or web address] Added aquatics 09/23/21  ASSESSMENT:  CLINICAL IMPRESSION:  The patient reports she is still having difficulty with stairs. We worked on IT trainer today and quad strengthening. She was stiff to start but her range opened up well with mobilization. She was advised that even if it hurts, at this point she is not hurting anything. She was encouraged to do exercises at home on both legs if she feels like her right is backtracking.    OBJECTIVE IMPAIRMENTS Abnormal gait, decreased activity tolerance, decreased balance, decreased mobility, difficulty walking, decreased ROM, decreased strength, increased edema, increased muscle spasms, impaired flexibility, impaired sensation, improper body mechanics, and pain.   ACTIVITY LIMITATIONS standing, squatting, stairs, transfers, bed mobility, and locomotion level  PARTICIPATION LIMITATIONS: meal prep, cleaning, driving, shopping, and yard work  PERSONAL FACTORS  current vertigo  are also affecting patient's  functional outcome.   REHAB POTENTIAL: Good  CLINICAL DECISION MAKING: Stable/uncomplicated  EVALUATION COMPLEXITY: Low   GOALS: Goals reviewed with patient? Yes  SHORT TERM GOALS: Target date: 09/02/2021  ROM 0-120 Baseline:112 on 09/22/18 Goal status: ongoing  2.  Ambulation without AD in household distances Baseline:  Goal status: achieved   LONG TERM GOALS: Target date: 10/14/2021   Able to ambulate through daily activities with heel toe gait and without AD Baseline: 8/25- depends on tightness of knee, varies  Goal status: ongoing  2.  Pt will return to regular exercise program for long term strength Baseline: 8/25- not there yet  Goal status: Ongoing  3.  Pt will meet FOTO goal Baseline: 8/25-50 Goal status: ongoing  4.  Able to return to yard work without limitation by knee pain Baseline: 8/25- varies  Goal status: Partially Met      PLAN: PT FREQUENCY: 2x/week  PT DURATION: other: 5 weeks  PLANNED INTERVENTIONS: Therapeutic exercises, Therapeutic activity, Neuromuscular re-education, Balance training, Gait training, Patient/Family education, Joint mobilization, Stair training, Aquatic Therapy, Dry Needling, Electrical stimulation, Cryotherapy, Moist heat, Taping, Manual therapy, and Re-evaluation  PLAN FOR NEXT SESSION: cont aggressive flexion ROM   Carolyne Littles PT DPT  10/20/2021, 8:09 AM

## 2021-10-22 ENCOUNTER — Other Ambulatory Visit (HOSPITAL_BASED_OUTPATIENT_CLINIC_OR_DEPARTMENT_OTHER): Payer: Self-pay

## 2021-10-27 ENCOUNTER — Ambulatory Visit (HOSPITAL_BASED_OUTPATIENT_CLINIC_OR_DEPARTMENT_OTHER): Payer: Medicare Other | Attending: Orthopedic Surgery | Admitting: Physical Therapy

## 2021-10-27 ENCOUNTER — Encounter (HOSPITAL_BASED_OUTPATIENT_CLINIC_OR_DEPARTMENT_OTHER): Payer: Self-pay | Admitting: Physical Therapy

## 2021-10-27 DIAGNOSIS — R600 Localized edema: Secondary | ICD-10-CM | POA: Insufficient documentation

## 2021-10-27 DIAGNOSIS — M25562 Pain in left knee: Secondary | ICD-10-CM | POA: Insufficient documentation

## 2021-10-27 DIAGNOSIS — M25662 Stiffness of left knee, not elsewhere classified: Secondary | ICD-10-CM | POA: Diagnosis present

## 2021-10-27 DIAGNOSIS — R262 Difficulty in walking, not elsewhere classified: Secondary | ICD-10-CM | POA: Insufficient documentation

## 2021-10-27 NOTE — Therapy (Signed)
OUTPATIENT PHYSICAL THERAPY LOWER EXTREMITY RECERT     Patient Name: Jeanette Rodriguez MRN: [id number] DOB:Apr 30, 1955, 66 y.o., female Today's Date: 10/27/2021   PT End of Session - 10/27/21 0923     Visit Number 17    Number of Visits 27    Date for PT Re-Evaluation 11/17/21    Authorization Type MCR    Progress Note Due on Visit 20    PT Start Time 0930    PT Stop Time 1012    PT Time Calculation (min) 42 min    Activity Tolerance Patient tolerated treatment well    Behavior During Therapy WFL for tasks assessed/performed                   Past Medical History:  Diagnosis Date   Anxiety    Arthritis    Asthma    Depression    Fibromyalgia    GERD (gastroesophageal reflux disease)    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 [place]   Hypertension    Pneumonia    Past Surgical History:  Procedure Laterality Date   ANKLE SURGERY     x3 right   2007, 2009,2011   TONSILLECTOMY     TOTAL KNEE ARTHROPLASTY Right 08/03/2020   Procedure: TOTAL KNEE ARTHROPLASTY;  Surgeon: Gaynelle Arabian, MD;  Location: [facility];  Service: Orthopedics;  Laterality: Right;  52min   TOTAL KNEE ARTHROPLASTY Left 08/02/2021   Procedure: TOTAL KNEE ARTHROPLASTY;  Surgeon: Gaynelle Arabian, MD;  Location: [facility];  Service: Orthopedics;  Laterality: Left;   Patient Active Problem List   Diagnosis Date Noted   Osteoarthritis of left knee 08/02/2021   Primary osteoarthritis of knee 08/04/2020   OA (osteoarthritis) of knee 08/03/2020    PCP: Jonathon Jordan, MD  REFERRING PROVIDER: Gaynelle Arabian, MD  REFERRING DIAG: [phone] (ICD-10-CM) - Presence of left artificial knee joint  THERAPY DIAG:  Acute pain of left knee  Stiffness of left knee, not elsewhere classified  Difficulty in walking, not elsewhere classified  Localized edema  Rationale for Evaluation and Treatment Rehabilitation  ONSET DATE: DOS 08/02/21  SUBJECTIVE:   SUBJECTIVE STATEMENT:  I have been  working it like crazy  PERTINENT HISTORY: none  PAIN:  Are you having pain? Yes: NPRS scale: 5/10 Pain location: Lt knee, posterior  Pain description: ache, sharp Aggravating factors: up and on it  Relieving factors: heat and ice   PRECAUTIONS: None  WEIGHT BEARING RESTRICTIONS No  FALLS:  Has patient fallen in last 6 months? No  LIVING ENVIRONMENT: Lives with: lives with their family and lives with their spouse Lives in: House/apartment  PLOF: Independent  PATIENT GOALS decrease pain, walk   OBJECTIVE:    PATIENT SURVEYS:  FOTO 26 09/21/21: 52 8/25: 50.1  COGNITION:  Overall cognitive status: Within functional limits for tasks assessed     EDEMA:  Circumferential: 46.5 cm 09/21/21 44.0 cm    LOWER EXTREMITY ROM:  Passive ROM Left Eval - 6/16 Left 6/22 LEFT 7/6 LEFT 7/10 Left 8/1 LEFT 8/23 L 8/25 L 8/30  Knee flexion 56 85 92 100 112 90 Beg of session 90 beginning of session; 94 at best today due to pain  103   Knee extension -10 -6 0 0 0     Ankle dorsiflexion          Ankle plantarflexion           (Blank rows = not tested)  LOWER EXTREMITY  MMT:  Lacking quad activation on left side                    8/25                   Ankle DF: 5/5 B                   Knee extension: R 5/5 L 4+/5                    Knee flexion: R 4/5 L 3/5                    Hip flexion: R 4+/5, L 4/5     GAIT: 7/5- no AD, mild antalgic gait but able to reduce with cues to avoid circumduction    TODAY'S TREATMENT:  9/6 Nu step 5 min L4 Sit<>stand- cues for midline Lateral ecc step down 4" with Lt UE on chair 3x15 Single leg balance on airex 20s holds x5  each Seated HSS 3x5 deep breaths MANUAL: end range flexion PAs grade 4 2 rounds of 3/30s, scar mobilization and patellar inf mobs     PATIENT EDUCATION:  Education details: Geophysicist/field seismologist of condition, POC, HEP, exercise form/rationale  Person educated: Patient Education method: Explanation,  Demonstration, Tactile cues, Verbal cues, and Handouts Education comprehension: verbalized understanding, returned demonstration, verbal cues required, tactile cues required, and needs further education   HOME EXERCISE PROGRAM: [email or web address] Added aquatics 09/23/21  ASSESSMENT:  CLINICAL IMPRESSION:  Flexion to 108 with overpressure at end of session. C/o ant tib distal attachment soreness. Will continue to focus on knee flexibility and gross strength to reduce pain from post knee fluid buildup.    OBJECTIVE IMPAIRMENTS Abnormal gait, decreased activity tolerance, decreased balance, decreased mobility, difficulty walking, decreased ROM, decreased strength, increased edema, increased muscle spasms, impaired flexibility, impaired sensation, improper body mechanics, and pain.   ACTIVITY LIMITATIONS standing, squatting, stairs, transfers, bed mobility, and locomotion level  PARTICIPATION LIMITATIONS: meal prep, cleaning, driving, shopping, and yard work  PERSONAL FACTORS  current vertigo  are also affecting patient's functional outcome.   REHAB POTENTIAL: Good  CLINICAL DECISION MAKING: Stable/uncomplicated  EVALUATION COMPLEXITY: Low   GOALS: Goals reviewed with patient? Yes  SHORT TERM GOALS: Target date: 09/02/2021  ROM 0-120 Baseline:112 on 09/22/18 Goal status: ongoing  2.  Ambulation without AD in household distances Baseline:  Goal status: achieved   LONG TERM GOALS: Target date: 10/14/2021   Able to ambulate through daily activities with heel toe gait and without AD Baseline: 8/25- depends on tightness of knee, varies  Goal status: ongoing  2.  Pt will return to regular exercise program for long term strength Baseline: 8/25- not there yet  Goal status: Ongoing  3.  Pt will meet FOTO goal Baseline: 8/25-50 Goal status: ongoing  4.  Able to return to yard work without limitation by knee pain Baseline: 8/25- varies  Goal status: Partially Met      PLAN: PT  FREQUENCY: 2x/week  PT DURATION: other: 5 weeks  PLANNED INTERVENTIONS: Therapeutic exercises, Therapeutic activity, Neuromuscular re-education, Balance training, Gait training, Patient/Family education, Joint mobilization, Stair training, Aquatic Therapy, Dry Needling, Electrical stimulation, Cryotherapy, Moist heat, Taping, Manual therapy, and Re-evaluation  PLAN FOR NEXT SESSION: cont aggressive flexion ROM   Sweetie Giebler C. Cari Burgo PT, DPT 10/27/21 10:12 AM

## 2021-10-29 ENCOUNTER — Ambulatory Visit (HOSPITAL_BASED_OUTPATIENT_CLINIC_OR_DEPARTMENT_OTHER): Payer: Medicare Other | Admitting: Physical Therapy

## 2021-10-29 ENCOUNTER — Encounter (HOSPITAL_BASED_OUTPATIENT_CLINIC_OR_DEPARTMENT_OTHER): Payer: Self-pay | Admitting: Physical Therapy

## 2021-10-29 DIAGNOSIS — M25662 Stiffness of left knee, not elsewhere classified: Secondary | ICD-10-CM

## 2021-10-29 DIAGNOSIS — M25562 Pain in left knee: Secondary | ICD-10-CM | POA: Diagnosis not present

## 2021-10-29 NOTE — Therapy (Signed)
OUTPATIENT PHYSICAL THERAPY LOWER EXTREMITY RECERT     Patient Name: Jeanette Rodriguez MRN: [id number] DOB:12/20/1955, 66 y.o., female Today's Date: 10/29/2021   PT End of Session - 10/29/21 1015     Visit Number 18    Number of Visits 27    Date for PT Re-Evaluation 11/17/21    Authorization Type MCR    Progress Note Due on Visit 20    PT Start Time 1015    PT Stop Time 1053    PT Time Calculation (min) 38 min    Activity Tolerance Patient tolerated treatment well    Behavior During Therapy WFL for tasks assessed/performed                   Past Medical History:  Diagnosis Date   Anxiety    Arthritis    Asthma    Depression    Fibromyalgia    GERD (gastroesophageal reflux disease)    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 [place]   Hypertension    Pneumonia    Past Surgical History:  Procedure Laterality Date   ANKLE SURGERY     x3 right   2007, 2009,2011   TONSILLECTOMY     TOTAL KNEE ARTHROPLASTY Right 08/03/2020   Procedure: TOTAL KNEE ARTHROPLASTY;  Surgeon: Aluisio, Frank, MD;  Location: [facility];  Service: Orthopedics;  Laterality: Right;  50min   TOTAL KNEE ARTHROPLASTY Left 08/02/2021   Procedure: TOTAL KNEE ARTHROPLASTY;  Surgeon: Aluisio, Frank, MD;  Location: [facility];  Service: Orthopedics;  Laterality: Left;   Patient Active Problem List   Diagnosis Date Noted   Osteoarthritis of left knee 08/02/2021   Primary osteoarthritis of knee 08/04/2020   OA (osteoarthritis) of knee 08/03/2020    PCP: Sharon Wolters, MD  REFERRING PROVIDER: Aluisio, Frank, MD  REFERRING DIAG: Z96.652 (ICD-10-CM) - Presence of left artificial knee joint  THERAPY DIAG:  Acute pain of left knee  Stiffness of left knee, not elsewhere classified  Rationale for Evaluation and Treatment Rehabilitation  ONSET DATE: DOS 08/02/21  SUBJECTIVE:   SUBJECTIVE STATEMENT:  Feeling pretty good this morning. Took pain meds and muscle relaxer and stretched in  the hot tub this AM.   PERTINENT HISTORY: none  PAIN:  Are you having pain? Yes: NPRS scale: 0-1/10 Pain location: Lt knee, posterior  Pain description: ache, sharp Aggravating factors: up and on it  Relieving factors: heat and ice   PRECAUTIONS: None  WEIGHT BEARING RESTRICTIONS No  FALLS:  Has patient fallen in last 6 months? No  LIVING ENVIRONMENT: Lives with: lives with their family and lives with their spouse Lives in: House/apartment  PLOF: Independent  PATIENT GOALS decrease pain, walk   OBJECTIVE:    PATIENT SURVEYS:  FOTO 26 09/21/21: 52 8/25: 50.1  COGNITION:  Overall cognitive status: Within functional limits for tasks assessed     EDEMA:  Circumferential: 46.5 cm 09/21/21 44.0 cm    LOWER EXTREMITY ROM:  Passive ROM Left Eval - 6/16 Left 6/22 LEFT 7/6 LEFT 7/10 Left 8/1 LEFT 8/23 L 8/25 L 8/30  Knee flexion 56 85 92 100 112 90 Beg of session 90 beginning of session; 94 at best today due to pain  103   Knee extension -10 -6 0 0 0     Ankle dorsiflexion          Ankle plantarflexion           (Blank rows = not tested)    LOWER EXTREMITY MMT:  Lacking quad activation on left side                    8/25                   Ankle DF: 5/5 B                   Knee extension: R 5/5 L 4+/5                    Knee flexion: R 4/5 L 3/5                    Hip flexion: R 4+/5, L 4/5     GAIT: 7/5- no AD, mild antalgic gait but able to reduce with cues to avoid circumduction    TODAY'S TREATMENT:  9/8 LAQ red tband resisted 3x10 Lateral step up with eccentric tap 4" Fwd eccentric step tap 4" Prone quad stretch with strap 5x5 breaths Prone HS curls AROM x15 Recumb bike 3 min- unable to rotate around with either knee Shuttle 2*25+6 double leg press, sitting upright, scar mob at end range flexion      PATIENT EDUCATION:  Education details: Geophysicist/field seismologist of condition, POC, HEP, exercise form/rationale  Person educated: Patient Education  method: Explanation, Demonstration, Tactile cues, Verbal cues, and Handouts Education comprehension: verbalized understanding, returned demonstration, verbal cues required, tactile cues required, and needs further education   HOME EXERCISE PROGRAM: [email or web address] Added aquatics 09/23/21  ASSESSMENT:  CLINICAL IMPRESSION:  Decreasing tx to 1/week. Was unbale to [place] full rotation on bike but is very close. Notable lack of extensibility of skin & incision site in end range flexion.    OBJECTIVE IMPAIRMENTS Abnormal gait, decreased activity tolerance, decreased balance, decreased mobility, difficulty walking, decreased ROM, decreased strength, increased edema, increased muscle spasms, impaired flexibility, impaired sensation, improper body mechanics, and pain.   ACTIVITY LIMITATIONS standing, squatting, stairs, transfers, bed mobility, and locomotion level  PARTICIPATION LIMITATIONS: meal prep, cleaning, driving, shopping, and yard work  PERSONAL FACTORS  current vertigo  are also affecting patient's functional outcome.   REHAB POTENTIAL: Good  CLINICAL DECISION MAKING: Stable/uncomplicated  EVALUATION COMPLEXITY: Low   GOALS: Goals reviewed with patient? Yes  SHORT TERM GOALS: Target date: 09/02/2021  ROM 0-120 Baseline:112 on 09/22/18 Goal status: ongoing  2.  Ambulation without AD in household distances Baseline:  Goal status: achieved   LONG TERM GOALS: Target date: 11/17/21  Able to ambulate through daily activities with heel toe gait and without AD Baseline: 8/25- depends on tightness of knee, varies  Goal status: ongoing  2.  Pt will return to regular exercise program for long term strength Baseline: 8/25- not there yet  Goal status: Ongoing  3.  Pt will meet FOTO goal Baseline: 8/25-50 Goal status: ongoing  4.  Able to return to yard work without limitation by knee pain Baseline: 8/25- varies  Goal status: Partially Met      PLAN: PT FREQUENCY:  2x/week  PT DURATION: other: 5 weeks  PLANNED INTERVENTIONS: Therapeutic exercises, Therapeutic activity, Neuromuscular re-education, Balance training, Gait training, Patient/Family education, Joint mobilization, Stair training, Aquatic Therapy, Dry Needling, Electrical stimulation, Cryotherapy, Moist heat, Taping, Manual therapy, and Re-evaluation  PLAN FOR NEXT SESSION: cont aggressive flexion ROM   Sharnell Knight C. Takeyla Million PT, DPT 10/29/21 10:53 AM

## 2021-11-01 ENCOUNTER — Encounter (HOSPITAL_BASED_OUTPATIENT_CLINIC_OR_DEPARTMENT_OTHER): Payer: Self-pay

## 2021-11-01 ENCOUNTER — Ambulatory Visit (HOSPITAL_BASED_OUTPATIENT_CLINIC_OR_DEPARTMENT_OTHER): Payer: Medicare Other | Admitting: Physical Therapy

## 2021-11-02 NOTE — Therapy (Signed)
OUTPATIENT PHYSICAL THERAPY LOWER EXTREMITY RECERT     Patient Name: Jeanette Rodriguez MRN: [id number] DOB:01/22/1956, 66 y.o., female Today's Date: 11/04/2021   PT End of Session - 11/03/21 1022     Visit Number 19    Number of Visits 27    Date for PT Re-Evaluation 11/17/21    Authorization Type MCR    Progress Note Due on Visit 20    PT Start Time 0935    PT Stop Time 1018    PT Time Calculation (min) 43 min    Activity Tolerance Patient tolerated treatment well    Behavior During Therapy WFL for tasks assessed/performed                    Past Medical History:  Diagnosis Date   Anxiety    Arthritis    Asthma    Depression    Fibromyalgia    GERD (gastroesophageal reflux disease)    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 [place]   Hypertension    Pneumonia    Past Surgical History:  Procedure Laterality Date   ANKLE SURGERY     x3 right   2007, 2009,2011   TONSILLECTOMY     TOTAL KNEE ARTHROPLASTY Right 08/03/2020   Procedure: TOTAL KNEE ARTHROPLASTY;  Surgeon: Gaynelle Arabian, MD;  Location: [facility];  Service: Orthopedics;  Laterality: Right;  15mn   TOTAL KNEE ARTHROPLASTY Left 08/02/2021   Procedure: TOTAL KNEE ARTHROPLASTY;  Surgeon: AGaynelle Arabian MD;  Location: [facility];  Service: Orthopedics;  Laterality: Left;   Patient Active Problem List   Diagnosis Date Noted   Osteoarthritis of left knee 08/02/2021   Primary osteoarthritis of knee 08/04/2020   OA (osteoarthritis) of knee 08/03/2020    PCP: SJonathon Jordan MD  REFERRING PROVIDER: AGaynelle Arabian MD  REFERRING DIAG: [id number](ICD-10-CM) - Presence of left artificial knee joint  THERAPY DIAG:  Acute pain of left knee  Stiffness of left knee, not elsewhere classified  Difficulty in walking, not elsewhere classified  Rationale for Evaluation and Treatment Rehabilitation  ONSET DATE: DOS 08/02/21  SUBJECTIVE:   SUBJECTIVE STATEMENT:  Pt is 13 weeks and 2 days s/p L  TKA.  Pt states she is stiff this AM.  She has been trying to warm up her knee this AM.  She got in the hot tub this AM and also did the leg press prior to today's appt.  Pt states her knee didn't hurt worse than normal after prior Rx and she used ice when she returned home.  Pt states the ice helped.  Pt states she also has stiffness in R knee and reports her R knee was doing better prior compared to now.  Pt has increased pain in bilat knee when performing sit to stand transfers.    PERTINENT HISTORY: L TKA on 08/02/2021 R TKA on 08/03/2020  PAIN:  Are you having pain? Yes: NPRS scale: 1/10 Pain location: Lt knee, posterior  Pain description: ache, sharp Aggravating factors: up and on it  Relieving factors: heat and ice   PRECAUTIONS: None  WEIGHT BEARING RESTRICTIONS No  FALLS:  Has patient fallen in last 6 months? No  LIVING ENVIRONMENT: Lives with: lives with their family and lives with their spouse Lives in: House/apartment  PLOF: Independent  PATIENT GOALS decrease pain, walk   OBJECTIVE:      LOWER EXTREMITY ROM:  Passive ROM Left Eval - 6/16 Left 6/22 LEFT 7/6 LEFT  7/10 Left 8/1 LEFT 8/23 L 8/25 L 8/30 L AROM/PROM  Knee flexion 56 85 92 100 112 90 Beg of session 90 beginning of session; 94 at best today due to pain  103  88 to 92 / 94 to 98  Knee extension -10 -6 0 0 0    0  Ankle dorsiflexion           Ankle plantarflexion            (Blank rows = not tested)   TODAY'S TREATMENT:  Therapeutic Exercise: Recumb bike 5 min- unable to rotate around with either knee LAQ with 3# 2x10 Seated HS curl with RTB 2x10 reps Step ups 4 inch and 6 inch x10 reps each Lateral step up with eccentric tap 4" 2x10 Standing knee flexion stretch on step with 5-10 sec hold  PT educated pt concerning flexion stretches.    Assessed Pt's performance of stairs:  Pt ascended stairs with a reciprocal gait with bilat rails and descended stairs with a step thru gait with  bilat rails.     Manual Therapy: 4D patellar mobs f/b Grade II and III PA  jt mobs to L knee with knee flexed seated at EOT  Pt received L knee flexion and extension PROM in supine Pt received hold relax in sitting and in prone to improve knee flexion AROM    PATIENT EDUCATION:  Education details: Anatomy of condition, POC, HEP, exercise form/rationale.  PT answered pt's questions Person educated: Patient Education method: Explanation, Demonstration, Tactile cues, Verbal cues, and Handouts Education comprehension: verbalized understanding, returned demonstration, verbal cues required, tactile cues required, and needs further education   HOME EXERCISE PROGRAM: [email or web address] Added aquatics 09/23/21  ASSESSMENT:  CLINICAL IMPRESSION: Pt has good knee extension AROM, but continues to be very limited and tight in flexion.  Pt demonstrated improved knee flexion AROM and PROM by 4 deg after hold relax stretching.  Pt also has c/o's of R knee stiffness and reports her R knee was doing better prior.  Pt did well ascending stairs with bilat rails and had more difficulty with descending stairs.  Pt performed exercises well with cuing for correct form.  She responded well to Rx having no c/o's after Rx.          OBJECTIVE IMPAIRMENTS Abnormal gait, decreased activity tolerance, decreased balance, decreased mobility, difficulty walking, decreased ROM, decreased strength, increased edema, increased muscle spasms, impaired flexibility, impaired sensation, improper body mechanics, and pain.   ACTIVITY LIMITATIONS standing, squatting, stairs, transfers, bed mobility, and locomotion level  PARTICIPATION LIMITATIONS: meal prep, cleaning, driving, shopping, and yard work  PERSONAL FACTORS  current vertigo  are also affecting patient's functional outcome.   REHAB POTENTIAL: Good  CLINICAL DECISION MAKING: Stable/uncomplicated  EVALUATION COMPLEXITY: Low   GOALS: Goals reviewed with patient?  Yes  SHORT TERM GOALS: Target date: 09/02/2021  ROM 0-120 Baseline:112 on 09/22/18 Goal status: ongoing  2.  Ambulation without AD in household distances Baseline:  Goal status: achieved   LONG TERM GOALS: Target date: 11/17/21  Able to ambulate through daily activities with heel toe gait and without AD Baseline: 8/25- depends on tightness of knee, varies  Goal status: ongoing  2.  Pt will return to regular exercise program for long term strength Baseline: 8/25- not there yet  Goal status: Ongoing  3.  Pt will meet FOTO goal Baseline: 8/25-50 Goal status: ongoing  4.  Able to return to yard work without limitation by knee pain Baseline: 8/25-  varies  Goal status: Partially Met      PLAN: PT FREQUENCY: 2x/week  PT DURATION: other: 5 weeks  PLANNED INTERVENTIONS: Therapeutic exercises, Therapeutic activity, Neuromuscular re-education, Balance training, Gait training, Patient/Family education, Joint mobilization, Stair training, Aquatic Therapy, Dry Needling, Electrical stimulation, Cryotherapy, Moist heat, Taping, Manual therapy, and Re-evaluation  PLAN FOR NEXT SESSION: cont aggressive flexion ROM.  Cont with strengthening.     Selinda Michaels III PT, DPT 11/04/21 9:12 AM

## 2021-11-03 ENCOUNTER — Encounter (HOSPITAL_BASED_OUTPATIENT_CLINIC_OR_DEPARTMENT_OTHER): Payer: Self-pay | Admitting: Physical Therapy

## 2021-11-03 ENCOUNTER — Ambulatory Visit (HOSPITAL_BASED_OUTPATIENT_CLINIC_OR_DEPARTMENT_OTHER): Payer: Medicare Other | Admitting: Physical Therapy

## 2021-11-03 DIAGNOSIS — M25562 Pain in left knee: Secondary | ICD-10-CM

## 2021-11-03 DIAGNOSIS — M25662 Stiffness of left knee, not elsewhere classified: Secondary | ICD-10-CM

## 2021-11-03 DIAGNOSIS — R262 Difficulty in walking, not elsewhere classified: Secondary | ICD-10-CM

## 2021-11-08 ENCOUNTER — Other Ambulatory Visit (HOSPITAL_BASED_OUTPATIENT_CLINIC_OR_DEPARTMENT_OTHER): Payer: Self-pay

## 2021-11-08 ENCOUNTER — Encounter (HOSPITAL_BASED_OUTPATIENT_CLINIC_OR_DEPARTMENT_OTHER): Payer: Self-pay | Admitting: Physical Therapy

## 2021-11-08 MED ORDER — CETIRIZINE HCL 10 MG PO TABS
10.0000 mg | ORAL_TABLET | Freq: Every day | ORAL | 3 refills | Status: DC
Start: 2021-11-08 — End: 2023-02-02
  Filled 2021-11-08: qty 100, 100d supply, fill #0
  Filled 2022-02-17 – 2022-07-22 (×2): qty 100, 100d supply, fill #1

## 2021-11-08 MED ORDER — OXYCODONE-ACETAMINOPHEN 10-325 MG PO TABS
1.0000 | ORAL_TABLET | Freq: Three times a day (TID) | ORAL | 0 refills | Status: DC | PRN
  Filled 2021-11-08: qty 90, 30d supply, fill #0

## 2021-11-08 MED ORDER — METHOCARBAMOL 500 MG PO TABS
500.0000 mg | ORAL_TABLET | Freq: Three times a day (TID) | ORAL | 0 refills | Status: DC | PRN
  Filled 2021-11-08: qty 30, 10d supply, fill #0

## 2021-11-09 ENCOUNTER — Other Ambulatory Visit (HOSPITAL_BASED_OUTPATIENT_CLINIC_OR_DEPARTMENT_OTHER): Payer: Self-pay

## 2021-11-09 MED ORDER — OZEMPIC (2 MG/DOSE) 8 MG/3ML ~~LOC~~ SOPN: PEN_INJECTOR | SUBCUTANEOUS | 3 refills | Status: DC

## 2021-11-10 ENCOUNTER — Encounter (HOSPITAL_BASED_OUTPATIENT_CLINIC_OR_DEPARTMENT_OTHER): Payer: Self-pay | Admitting: Physical Therapy

## 2021-11-10 ENCOUNTER — Ambulatory Visit (HOSPITAL_BASED_OUTPATIENT_CLINIC_OR_DEPARTMENT_OTHER): Payer: Medicare Other | Admitting: Physical Therapy

## 2021-11-10 ENCOUNTER — Other Ambulatory Visit (HOSPITAL_BASED_OUTPATIENT_CLINIC_OR_DEPARTMENT_OTHER): Payer: Self-pay

## 2021-11-10 DIAGNOSIS — M25662 Stiffness of left knee, not elsewhere classified: Secondary | ICD-10-CM

## 2021-11-10 DIAGNOSIS — M25562 Pain in left knee: Secondary | ICD-10-CM | POA: Diagnosis not present

## 2021-11-10 DIAGNOSIS — R262 Difficulty in walking, not elsewhere classified: Secondary | ICD-10-CM

## 2021-11-10 NOTE — Therapy (Signed)
OUTPATIENT PHYSICAL THERAPY LOWER EXTREMITY RECERT     Patient Name: Jeanette Rodriguez MRN: [id number] DOB:03/04/1955, 66 y.o., female Today's Date: 11/10/2021   PT End of Session - 11/10/21 0848     Visit Number 20    Number of Visits 27    Date for PT Re-Evaluation 11/17/21    Authorization Type MCR    Progress Note Due on Visit 22    PT Start Time 0845    PT Stop Time 0929    PT Time Calculation (min) 44 min    Activity Tolerance Patient tolerated treatment well    Behavior During Therapy WFL for tasks assessed/performed                    Past Medical History:  Diagnosis Date   Anxiety    Arthritis    Asthma    Depression    Fibromyalgia    GERD (gastroesophageal reflux disease)    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 [place]   Hypertension    Pneumonia    Past Surgical History:  Procedure Laterality Date   ANKLE SURGERY     x3 right   2007, 2009,2011   TONSILLECTOMY     TOTAL KNEE ARTHROPLASTY Right 08/03/2020   Procedure: TOTAL KNEE ARTHROPLASTY;  Surgeon: Gaynelle Arabian, MD;  Location: [facility];  Service: Orthopedics;  Laterality: Right;  16mn   TOTAL KNEE ARTHROPLASTY Left 08/02/2021   Procedure: TOTAL KNEE ARTHROPLASTY;  Surgeon: AGaynelle Arabian MD;  Location: [facility];  Service: Orthopedics;  Laterality: Left;   Patient Active Problem List   Diagnosis Date Noted   Osteoarthritis of left knee 08/02/2021   Primary osteoarthritis of knee 08/04/2020   OA (osteoarthritis) of knee 08/03/2020    PCP: SJonathon Jordan MD  REFERRING PROVIDER: AGaynelle Arabian MD  REFERRING DIAG: [phone](ICD-10-CM) - Presence of left artificial knee joint  THERAPY DIAG:  Acute pain of left knee  Stiffness of left knee, not elsewhere classified  Difficulty in walking, not elsewhere classified  Rationale for Evaluation and Treatment Rehabilitation  ONSET DATE: DOS 08/02/21  SUBJECTIVE:  Progress Note Reporting Period 08/06/21 to  11/10/2021   See note below for Objective Data and Assessment of Progress/Goals.      SUBJECTIVE STATEMENT:  Took pain meds and muscle relaxer about 10 min before 8.   PERTINENT HISTORY: L TKA on 08/02/2021 R TKA on 08/03/2020  PAIN:  Are you having pain? Yes: NPRS scale: 1/10 Pain location: Lt knee, posterior  Pain description: ache, sharp Aggravating factors: up and on it  Relieving factors: heat and ice   PRECAUTIONS: None  WEIGHT BEARING RESTRICTIONS No  FALLS:  Has patient fallen in last 6 months? No  LIVING ENVIRONMENT: Lives with: lives with their family and lives with their spouse Lives in: House/apartment  PLOF: Independent  PATIENT GOALS decrease pain, walk   OBJECTIVE:   LOWER EXTREMITY MMT:  Via tindeq: 9/20- Rt 24.4 kg; Lt 15.4kg   LOWER EXTREMITY ROM:  Passive ROM Left Eval - 6/16 Left 6/22 LEFT 7/6 LEFT 7/10 Left 8/1 LEFT 8/23 L 8/25 L 8/30 L AROM/PROM  Knee flexion 56 85 92 100 112 90 Beg of session 90 beginning of session; 94 at best today due to pain  103  88 to 92 / 94 to 98  Knee extension -10 -6 0 0 0    0  Ankle dorsiflexion  Ankle plantarflexion            (Blank rows = not tested)   TODAY'S TREATMENT:  Bike 5 min Scar mobs over Rt patellar tendon Ktape to Rt patella for tracking and fat pad compression Ball bw knees- weight shfit out of chair to start to stand- hold for quad strengthening and midline weight placement Sit<>stand, slow movement with cues for form, ball bw knees  Manual Therapy: 4D patellar mobs f/b Grade II and III PA  jt mobs to L knee with knee flexed seated at EOT  Pt received L knee flexion and extension PROM in supine Pt received hold relax in sitting and in prone to improve knee flexion AROM    PATIENT EDUCATION:  Education details: Anatomy of condition, POC, HEP, exercise form/rationale.  PT answered pt's questions Person educated: Patient Education method: Explanation,  Demonstration, Tactile cues, Verbal cues, and Handouts Education comprehension: verbalized understanding, returned demonstration, verbal cues required, tactile cues required, and needs further education   HOME EXERCISE PROGRAM: [email or web address] Added aquatics 09/23/21  ASSESSMENT:  CLINICAL IMPRESSION: Patellar fat pad irritation noted on Rt side due to poor equity of strength in LEs. Training for midline posture in sit<>stand significantly reduced knee pain.    OBJECTIVE IMPAIRMENTS Abnormal gait, decreased activity tolerance, decreased balance, decreased mobility, difficulty walking, decreased ROM, decreased strength, increased edema, increased muscle spasms, impaired flexibility, impaired sensation, improper body mechanics, and pain.   ACTIVITY LIMITATIONS standing, squatting, stairs, transfers, bed mobility, and locomotion level  PARTICIPATION LIMITATIONS: meal prep, cleaning, driving, shopping, and yard work  PERSONAL FACTORS  current vertigo  are also affecting patient's functional outcome.   REHAB POTENTIAL: Good  CLINICAL DECISION MAKING: Stable/uncomplicated  EVALUATION COMPLEXITY: Low   GOALS: Goals reviewed with patient? Yes  SHORT TERM GOALS: Target date: 09/02/2021  ROM 0-120 Baseline:112 on 09/22/18 Goal status: ongoing  2.  Ambulation without AD in household distances Baseline:  Goal status: achieved   LONG TERM GOALS: Target date: 11/17/21  Able to ambulate through daily activities with heel toe gait and without AD Baseline: 8/25- depends on tightness of knee, varies  Goal status: ongoing  2.  Pt will return to regular exercise program for long term strength Baseline: 8/25- not there yet  Goal status: Ongoing  3.  Pt will meet FOTO goal Baseline: 8/25-50 Goal status: ongoing  4.  Able to return to yard work without limitation by knee pain Baseline: 8/25- varies  Goal status: Partially Met      PLAN: PT FREQUENCY: 2x/week  PT DURATION: other: 5  weeks  PLANNED INTERVENTIONS: Therapeutic exercises, Therapeutic activity, Neuromuscular re-education, Balance training, Gait training, Patient/Family education, Joint mobilization, Stair training, Aquatic Therapy, Dry Needling, Electrical stimulation, Cryotherapy, Moist heat, Taping, Manual therapy, and Re-evaluation  PLAN FOR NEXT SESSION: cont aggressive flexion ROM.  Cont with strengthening.    Sacred Roa C. Hava Massingale PT, DPT 11/10/21 9:30 AM

## 2021-11-15 ENCOUNTER — Encounter (HOSPITAL_BASED_OUTPATIENT_CLINIC_OR_DEPARTMENT_OTHER): Payer: Self-pay | Admitting: Physical Therapy

## 2021-11-17 ENCOUNTER — Encounter (HOSPITAL_BASED_OUTPATIENT_CLINIC_OR_DEPARTMENT_OTHER): Payer: Self-pay | Admitting: Physical Therapy

## 2021-11-19 ENCOUNTER — Other Ambulatory Visit (HOSPITAL_BASED_OUTPATIENT_CLINIC_OR_DEPARTMENT_OTHER): Payer: Self-pay

## 2021-11-22 ENCOUNTER — Ambulatory Visit (HOSPITAL_BASED_OUTPATIENT_CLINIC_OR_DEPARTMENT_OTHER): Payer: Medicare Other | Attending: Orthopedic Surgery | Admitting: Physical Therapy

## 2021-11-22 DIAGNOSIS — M25662 Stiffness of left knee, not elsewhere classified: Secondary | ICD-10-CM | POA: Insufficient documentation

## 2021-11-22 DIAGNOSIS — M25562 Pain in left knee: Secondary | ICD-10-CM | POA: Insufficient documentation

## 2021-11-22 NOTE — Therapy (Signed)
OUTPATIENT PHYSICAL THERAPY LOWER EXTREMITY d/c     Patient Name: Jeanette Rodriguez MRN: [id number] DOB:[birth year], 66 y.o., female Today's Date: 11/22/2021   PT End of Session - 11/22/21 0935     Visit Number 21    Number of Visits 27    Date for PT Re-Evaluation 11/26/21   per most recent auth sent   Authorization Type MCR    Progress Note Due on Visit 30    PT Start Time 0931    PT Stop Time 1013    PT Time Calculation (min) 42 min    Activity Tolerance Patient tolerated treatment well    Behavior During Therapy WFL for tasks assessed/performed                     Past Medical History:  Diagnosis Date   Anxiety    Arthritis    Asthma    Depression    Fibromyalgia    GERD (gastroesophageal reflux disease)    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 [place]   Hypertension    Pneumonia    Past Surgical History:  Procedure Laterality Date   ANKLE SURGERY     x3 right   2007, 2009,2011   TONSILLECTOMY     TOTAL KNEE ARTHROPLASTY Right 08/03/2020   Procedure: TOTAL KNEE ARTHROPLASTY;  Surgeon: Gaynelle Arabian, MD;  Location: [facility];  Service: Orthopedics;  Laterality: Right;  77mn   TOTAL KNEE ARTHROPLASTY Left 08/02/2021   Procedure: TOTAL KNEE ARTHROPLASTY;  Surgeon: AGaynelle Arabian MD;  Location: [facility];  Service: Orthopedics;  Laterality: Left;   Patient Active Problem List   Diagnosis Date Noted   Osteoarthritis of left knee 08/02/2021   Primary osteoarthritis of knee 08/04/2020   OA (osteoarthritis) of knee 08/03/2020    PCP: SJonathon Jordan MD  REFERRING PROVIDER: AGaynelle Arabian MD  REFERRING DIAG: [phone](ICD-10-CM) - Presence of left artificial knee joint  THERAPY DIAG:  Acute pain of left knee  Stiffness of left knee, not elsewhere classified  Rationale for Evaluation and Treatment Rehabilitation  ONSET DATE: DOS 08/02/21  SUBJECTIVE:   SUBJECTIVE STATEMENT:  I think I am doing pretty good.   PERTINENT HISTORY: L  TKA on 08/02/2021 R TKA on 08/03/2020  PAIN:  Are you having pain? Yes: NPRS scale: 1/10 Pain location: Lt knee, posterior  Pain description: ache, sharp Aggravating factors: up and on it  Relieving factors: heat and ice   PRECAUTIONS: None  WEIGHT BEARING RESTRICTIONS No  FALLS:  Has patient fallen in last 6 months? No  LIVING ENVIRONMENT: Lives with: lives with their family and lives with their spouse Lives in: House/apartment  PLOF: Independent  PATIENT GOALS decrease pain, walk   OBJECTIVE:   LOWER EXTREMITY MMT:  Via tindeq: 9/20- Rt 24.4 kg; Lt 15.4kg   LOWER EXTREMITY ROM:  Passive ROM Left Eval - 6/16 Left 6/22 LEFT 7/6 LEFT 7/10 Left 8/1 LEFT 8/23 L 8/25 L 8/30 L AROM/PROM  Knee flexion 56 85 92 100 112 90 Beg of session 90 beginning of session; 94 at best today due to pain  103  88 to 92 / 94 to 98  Knee extension -10 -6 0 0 0    0  Ankle dorsiflexion           Ankle plantarflexion            (Blank rows = not tested)   TODAY'S TREATMENT:  Treatment  11/22/21:   Nu step at L3- seat pressed close to front for end range stretch Child pose stretch Lateral step taps 6" Squat to tap table- 1 foot distance away SLS with head turns   Treatment                            11/10/21: Bike 5 min Scar mobs over Rt patellar tendon Ktape to Rt patella for tracking and fat pad compression Ball bw knees- weight shfit out of chair to start to stand- hold for quad strengthening and midline weight placement Sit<>stand, slow movement with cues for form, ball bw knees  Manual Therapy: 4D patellar mobs f/b Grade II and III PA  jt mobs to L knee with knee flexed seated at EOT  Pt received L knee flexion and extension PROM in supine Pt received hold relax in sitting and in prone to improve knee flexion AROM    PATIENT EDUCATION:  Education details: importance of continued HEP & long term changes in the knee Person educated:  Patient Education method: Explanation, Demonstration, Tactile cues, Verbal cues, and Handouts Education comprehension: verbalized understanding, returned demonstration, verbal cues required, tactile cues required, and needs further education   HOME EXERCISE PROGRAM: [email or web address] Added aquatics 09/23/21  ASSESSMENT:  CLINICAL IMPRESSION: Pt has made signifiant progress since beginning PT. While her range did not achieve 120 while in rehab, she is able to climb stairs, do yard work, get up and down from chairs and other functional activities well. She understands the importance of continued exercise and is a [employer] member so I will be available for questions when she is here.    OBJECTIVE IMPAIRMENTS Abnormal gait, decreased activity tolerance, decreased balance, decreased mobility, difficulty walking, decreased ROM, decreased strength, increased edema, increased muscle spasms, impaired flexibility, impaired sensation, improper body mechanics, and pain.   ACTIVITY LIMITATIONS standing, squatting, stairs, transfers, bed mobility, and locomotion level  PARTICIPATION LIMITATIONS: meal prep, cleaning, driving, shopping, and yard work  PERSONAL FACTORS  current vertigo  are also affecting patient's functional outcome.   REHAB POTENTIAL: Good  CLINICAL DECISION MAKING: Stable/uncomplicated  EVALUATION COMPLEXITY: Low   GOALS: Goals reviewed with patient? Yes  SHORT TERM GOALS: Target date: 09/02/2021  ROM 0-120 Baseline:112 on 09/22/18 Goal status: ongoing  2.  Ambulation without AD in household distances Baseline:  Goal status: achieved   LONG TERM GOALS: Target date: 11/17/21  Able to ambulate through daily activities with heel toe gait and without AD Baseline:  Goal status: achieved  2.  Pt will return to regular exercise program for long term strength Baseline:  Goal status: achieved  3.  Pt will meet FOTO goal Baseline: 57, goal 57 Goal status: progressed but not met  4.   Able to return to yard work without limitation by knee pain Baseline:  Goal status: achieved     PLAN: PT FREQUENCY: 2x/week  PT DURATION: other: 5 weeks  PLANNED INTERVENTIONS: Therapeutic exercises, Therapeutic activity, Neuromuscular re-education, Balance training, Gait training, Patient/Family education, Joint mobilization, Stair training, Aquatic Therapy, Dry Needling, Electrical stimulation, Cryotherapy, Moist heat, Taping, Manual therapy, and Re-evaluation  PLAN FOR NEXT SESSION: cont aggressive flexion ROM.  Cont with strengthening.   PHYSICAL THERAPY DISCHARGE SUMMARY  Visits from Start of Care: 21  Current functional level related to goals / functional outcomes: See above   Remaining deficits: See above   Education / Equipment: Anatomy of condition, POC, HEP, exercise form/rationale  Patient agrees to discharge. Patient goals were partially met. Patient is being discharged due to being pleased with the current functional level.  Nelline Lio C. Betul Brisky PT, DPT 11/22/21 12:39 PM

## 2021-11-26 ENCOUNTER — Other Ambulatory Visit (HOSPITAL_BASED_OUTPATIENT_CLINIC_OR_DEPARTMENT_OTHER): Payer: Self-pay

## 2021-11-26 MED ORDER — AMOXICILLIN-POT CLAVULANATE 875-125 MG PO TABS
1.0000 | ORAL_TABLET | Freq: Two times a day (BID) | ORAL | 0 refills | Status: DC
  Filled 2021-11-26: qty 20, 10d supply, fill #0

## 2021-12-08 ENCOUNTER — Other Ambulatory Visit (HOSPITAL_BASED_OUTPATIENT_CLINIC_OR_DEPARTMENT_OTHER): Payer: Self-pay

## 2021-12-08 MED ORDER — OXYCODONE-ACETAMINOPHEN 10-325 MG PO TABS
1.0000 | ORAL_TABLET | Freq: Three times a day (TID) | ORAL | 0 refills | Status: DC | PRN
  Filled 2021-12-08: qty 90, 30d supply, fill #0

## 2021-12-16 ENCOUNTER — Other Ambulatory Visit (HOSPITAL_BASED_OUTPATIENT_CLINIC_OR_DEPARTMENT_OTHER): Payer: Self-pay

## 2021-12-23 ENCOUNTER — Other Ambulatory Visit (HOSPITAL_BASED_OUTPATIENT_CLINIC_OR_DEPARTMENT_OTHER): Payer: Self-pay

## 2021-12-27 ENCOUNTER — Other Ambulatory Visit (HOSPITAL_BASED_OUTPATIENT_CLINIC_OR_DEPARTMENT_OTHER): Payer: Self-pay

## 2022-01-11 ENCOUNTER — Other Ambulatory Visit (HOSPITAL_BASED_OUTPATIENT_CLINIC_OR_DEPARTMENT_OTHER): Payer: Self-pay

## 2022-01-11 MED ORDER — OXYCODONE-ACETAMINOPHEN 10-325 MG PO TABS
1.0000 | ORAL_TABLET | Freq: Three times a day (TID) | ORAL | 0 refills | Status: DC | PRN
  Filled 2022-01-11: qty 90, 30d supply, fill #0

## 2022-01-12 ENCOUNTER — Other Ambulatory Visit (HOSPITAL_BASED_OUTPATIENT_CLINIC_OR_DEPARTMENT_OTHER): Payer: Self-pay

## 2022-01-18 ENCOUNTER — Other Ambulatory Visit (HOSPITAL_BASED_OUTPATIENT_CLINIC_OR_DEPARTMENT_OTHER): Payer: Self-pay

## 2022-02-09 ENCOUNTER — Other Ambulatory Visit (HOSPITAL_BASED_OUTPATIENT_CLINIC_OR_DEPARTMENT_OTHER): Payer: Self-pay

## 2022-02-16 ENCOUNTER — Other Ambulatory Visit (HOSPITAL_BASED_OUTPATIENT_CLINIC_OR_DEPARTMENT_OTHER): Payer: Self-pay

## 2022-02-17 ENCOUNTER — Other Ambulatory Visit: Payer: Self-pay

## 2022-02-17 ENCOUNTER — Other Ambulatory Visit (HOSPITAL_BASED_OUTPATIENT_CLINIC_OR_DEPARTMENT_OTHER): Payer: Self-pay

## 2022-02-17 MED ORDER — OXYCODONE-ACETAMINOPHEN 10-325 MG PO TABS
1.0000 | ORAL_TABLET | Freq: Three times a day (TID) | ORAL | 0 refills | Status: DC | PRN
  Filled 2022-02-17: qty 90, 30d supply, fill #0

## 2022-02-17 MED ORDER — GABAPENTIN 100 MG PO CAPS
300.0000 mg | ORAL_CAPSULE | Freq: Three times a day (TID) | ORAL | 1 refills | Status: DC
  Filled 2022-02-17: qty 270, 30d supply, fill #0
  Filled 2022-03-28: qty 270, 30d supply, fill #1

## 2022-02-17 MED ORDER — MONTELUKAST SODIUM 10 MG PO TABS
10.0000 mg | ORAL_TABLET | Freq: Every day | ORAL | 2 refills | Status: DC
  Filled 2022-02-17: qty 90, 90d supply, fill #0
  Filled 2022-05-31: qty 90, 90d supply, fill #1
  Filled 2022-08-13: qty 90, 90d supply, fill #2

## 2022-02-17 MED ORDER — CETIRIZINE HCL 10 MG PO TABS
10.0000 mg | ORAL_TABLET | Freq: Every day | ORAL | 0 refills | Status: DC
Start: 2022-02-17 — End: 2023-02-02
  Filled 2022-02-17: qty 100, 100d supply, fill #0

## 2022-02-17 MED ORDER — FLUTICASONE PROPIONATE 50 MCG/ACT NA SUSP
1.0000 | Freq: Every day | NASAL | 5 refills | Status: DC
  Filled 2022-02-17: qty 16, 30d supply, fill #0
  Filled 2022-08-11: qty 48, 90d supply, fill #0

## 2022-02-18 ENCOUNTER — Other Ambulatory Visit (HOSPITAL_BASED_OUTPATIENT_CLINIC_OR_DEPARTMENT_OTHER): Payer: Self-pay

## 2022-03-15 ENCOUNTER — Other Ambulatory Visit (HOSPITAL_BASED_OUTPATIENT_CLINIC_OR_DEPARTMENT_OTHER): Payer: Self-pay

## 2022-03-18 ENCOUNTER — Other Ambulatory Visit (HOSPITAL_BASED_OUTPATIENT_CLINIC_OR_DEPARTMENT_OTHER): Payer: Self-pay

## 2022-03-18 MED ORDER — OXYCODONE-ACETAMINOPHEN 10-325 MG PO TABS
1.0000 | ORAL_TABLET | Freq: Three times a day (TID) | ORAL | 0 refills | Status: DC | PRN
  Filled 2022-03-18: qty 90, 30d supply, fill #0

## 2022-03-28 ENCOUNTER — Other Ambulatory Visit (HOSPITAL_BASED_OUTPATIENT_CLINIC_OR_DEPARTMENT_OTHER): Payer: Self-pay

## 2022-03-29 ENCOUNTER — Other Ambulatory Visit: Payer: Self-pay | Admitting: Orthopedic Surgery

## 2022-03-29 ENCOUNTER — Other Ambulatory Visit (HOSPITAL_BASED_OUTPATIENT_CLINIC_OR_DEPARTMENT_OTHER): Payer: Self-pay

## 2022-03-29 DIAGNOSIS — M19011 Primary osteoarthritis, right shoulder: Secondary | ICD-10-CM

## 2022-03-30 ENCOUNTER — Other Ambulatory Visit: Payer: Self-pay

## 2022-03-30 ENCOUNTER — Other Ambulatory Visit (HOSPITAL_BASED_OUTPATIENT_CLINIC_OR_DEPARTMENT_OTHER): Payer: Self-pay

## 2022-03-30 MED ORDER — DIAZEPAM 10 MG PO TABS
10.0000 mg | ORAL_TABLET | ORAL | 0 refills | Status: DC | PRN
  Filled 2022-03-30 – 2022-03-31 (×2): qty 3, 3d supply, fill #0

## 2022-03-31 ENCOUNTER — Other Ambulatory Visit (HOSPITAL_BASED_OUTPATIENT_CLINIC_OR_DEPARTMENT_OTHER): Payer: Self-pay

## 2022-03-31 ENCOUNTER — Other Ambulatory Visit: Payer: Self-pay

## 2022-04-01 HISTORY — PX: SINUSOTOMY: SHX291

## 2022-04-07 NOTE — Therapy (Signed)
OUTPATIENT PHYSICAL THERAPY VESTIBULAR EVALUATION     Patient Name: Jeanette Rodriguez MRN: [id number] DOB:Jan 28, 1956, 67 y.o., female Today's Date: 04/08/2022  END OF SESSION:  PT End of Session - 04/08/22 1149     Visit Number 1    Number of Visits 5    Date for PT Re-Evaluation 05/08/22    Authorization Type Medicare - Part A & B    PT Start Time 0927    PT Stop Time 1015    PT Time Calculation (min) 48 min    Activity Tolerance Patient tolerated treatment well    Behavior During Therapy WFL for tasks assessed/performed             Past Medical History:  Diagnosis Date   Anxiety    Arthritis    Asthma    Depression    Fibromyalgia    GERD (gastroesophageal reflux disease)    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 [place]   Hypertension    Pneumonia    Past Surgical History:  Procedure Laterality Date   ANKLE SURGERY     x3 right   2007, 2009,2011   TONSILLECTOMY     TOTAL KNEE ARTHROPLASTY Right 08/03/2020   Procedure: TOTAL KNEE ARTHROPLASTY;  Surgeon: Gaynelle Arabian, MD;  Location: [facility];  Service: Orthopedics;  Laterality: Right;  42mn   TOTAL KNEE ARTHROPLASTY Left 08/02/2021   Procedure: TOTAL KNEE ARTHROPLASTY;  Surgeon: AGaynelle Arabian MD;  Location: [facility];  Service: Orthopedics;  Laterality: Left;   Patient Active Problem List   Diagnosis Date Noted   Osteoarthritis of left knee 08/02/2021   Primary osteoarthritis of knee 08/04/2020   OA (osteoarthritis) of knee 08/03/2020    PCP: WJonathon Jordan MD   REFERRING PROVIDER: BMelida Quitter MD   REFERRING DIAG: Diagnosis H81.10 (ICD-10-CM) - BPPV (benign paroxysmal positional vertigo)    THERAPY DIAG:  Dizziness and giddiness  BPPV (benign paroxysmal positional vertigo), right  ONSET DATE: 03/30/2022   Rationale for Evaluation and Treatment: Rehabilitation  SUBJECTIVE:   SUBJECTIVE STATEMENT: Has had dizziness since last May. Took a while to get into the ENT. Was  diagnosed with R posterior canal BPPV. Was not told of any maneuvers to do and was not treated with the Epley. Reports getting in and out of the bed makes her dizzy and has to close her eyes. Turning in bed or turning her head too quickly also brings it on. Feels like she is spinning. Feels it mainly when going to the R.  Also had surgery to clear out her sinuses from Dr. BRedmond Baseman Will be starting therapy for her knees soon. Had a fall at the end of Jan - fell off her bike when trying to get into the garage.    Pt accompanied by: self  PERTINENT HISTORY: PMH: Anxiety, Depression, Fibromyalgia, HTN, R total knee 07/2020, L total knee 2023   Sofiya reported the specific concern of motion provoked dizziness to her right side which began in May of 2023.   Recently saw Audiology on 03/21/22 and was diagnosed with positive DixHallpike to R   PAIN:  Are you having pain? Yes: NPRS scale: 7/10 Pain location: R shoulder Aggravating factors: Movement   Vitals:   04/08/22 0947  BP: 129/73  Pulse: 69     PRECAUTIONS: Fall  WEIGHT BEARING RESTRICTIONS: No  FALLS: Has patient fallen in last 6 months? Yes. Number of falls 1  LIVING ENVIRONMENT: Lives with: lives with their  spouse Lives in: House/apartment Stairs: Yes: Internal: 12 steps; on right going up  PLOF: Independent and takes incr time getting dressed due to R shoulder pain  PATIENT GOALS: Wants vertigo to go away   OBJECTIVE:     COGNITION: Overall cognitive status: Within functional limits for tasks assessed  POSTURE:  rounded shoulders and forward head  Cervical ROM:   WFL    GAIT: Gait pattern: step through pattern, antalgic, and wide BOS Distance walked: Clinic distances  Assistive device utilized: None Level of assistance: SBA Comments: Pt with antalgic gait pattern due to hx of knee replacements. Pt able to ambulate in and out of session with no issues.    PATIENT SURVEYS:  FOTO DPS: 46, DFS: 53.9    VESTIBULAR ASSESSMENT:  GENERAL OBSERVATION: Ambulates in with no AD, antalgic gait pattern due to knee discomfort and hx of total knee replacements (will be getting therapy for knees soon)   SYMPTOM BEHAVIOR:  Subjective history: See above  Non-Vestibular symptoms: changes in hearing, headaches, and thinks hearing changes are related to her sinuses   Type of dizziness: Spinning/Vertigo and "head feels foggy"   Frequency: Daily  Duration: Approx. 1 minute   Aggravating factors: Induced by position change: lying supine and supine to sit and Induced by motion: turning head quickly, getting in and out of bed will always make her dizzy.   Relieving factors: head stationary, closing eyes, and being still   Progression of symptoms: unchanged  OCULOMOTOR EXAM:  Ocular Alignment: normal  Ocular ROM: No Limitations  Spontaneous Nystagmus: absent  Gaze-Induced Nystagmus: absent  Smooth Pursuits: intact and pt reports some dizziness when going to the R   Saccades: intact   VESTIBULAR - OCULAR REFLEX:   Slow VOR: Normal  VOR Cancellation: Normal pt reporting some dizziness   Head-Impulse Test: HIT Right: negative HIT Left: negative    POSITIONAL TESTING: Right Dix-Hallpike: upbeating, right nystagmus and lasting approx. 15 seconds  Left Dix-Hallpike: no nystagmus   VESTIBULAR TREATMENT:                                                                                                   DATE: 04/08/22  Canalith Repositioning:  Epley Right: Number of Reps: 2, Response to Treatment: symptoms improved, and Comment: Pt with nystagmus and dizziness in 1st and 3rd position. When doing the Epley the 2nd time, pt with incr intensity of dizziness when laying back into position. Did not get to re-assess after 2nd rep of Epley.   PATIENT EDUCATION: Education details: Clinical findings, POC, Etiology of BPPV and purpose of Epley maneuver, reoccurrence rates with BPPV, what to expect after treatment,  answered additional questions from pt regarding BPPV.  Person educated: Patient Education method: Explanation Education comprehension: verbalized understanding  HOME EXERCISE PROGRAM: Will provide in future as appropriate.   GOALS: Goals reviewed with patient? Yes  SHORT TERM GOALS: ALL STGS = LTGS   LONG TERM GOALS: Target date: 05/06/2022  Pt will have negative positional testing in order to demo resolution of R BPPV to decr dizziness.  Baseline:  Goal  status: INITIAL  2.  Pt will improve DPS to at least a 59 in order to demo improved functional outcomes.  Baseline: 46 Goal status: INITIAL  3.  Pt will demo understanding of self Epley maneuver for home in case BPPV returns in the future.  Baseline:  Goal status: INITIAL    ASSESSMENT:  CLINICAL IMPRESSION: Patient is a 67 year old female referred to Neuro OPPT for R posterior canal BPPV.   Pt's PMH is significant for: Anxiety, Depression, Fibromyalgia, HTN, R total knee 07/2020, L total knee 2023. The following deficits were present during the exam: dizziness, R upbeating rotary nystagmus lasting approx. 15 seconds, indicating R posterior canal BPPV. Treated x2 reps of the Epley maneuver with pt having dizziness/nystagmus in positions 1 and 3. Did not get to re-assess after the 2nd rep due to time constraints. Pt able to ambulate out of session after tx with supervision and no issues. Pt would benefit from skilled PT to address these impairments and functional limitations to maximize functional mobility independence and decr dizziness.    OBJECTIVE IMPAIRMENTS: decreased activity tolerance, decreased balance, dizziness, and postural dysfunction.   ACTIVITY LIMITATIONS: bending, bed mobility, and locomotion level  PARTICIPATION LIMITATIONS: community activity  PERSONAL FACTORS: Behavior pattern, Past/current experiences, Time since onset of injury/illness/exacerbation, and 3+ comorbidities:  Anxiety, Depression, Fibromyalgia,  HTN, R total knee 07/2020, L total knee 2023   are also affecting patient's functional outcome.   REHAB POTENTIAL: Good  CLINICAL DECISION MAKING: Stable/uncomplicated  EVALUATION COMPLEXITY: Low   PLAN:  PT FREQUENCY: 1-2x/week  PT DURATION: 4 weeks  PLANNED INTERVENTIONS: Therapeutic exercises, Therapeutic activity, Neuromuscular re-education, Balance training, Gait training, Patient/Family education, Self Care, Vestibular training, and Canalith repositioning  PLAN FOR NEXT SESSION: Re-assess R posterior canal BPPV. Treat as appropriate.    Arliss Journey, PT, DPT  04/08/2022, 11:50 AM

## 2022-04-08 ENCOUNTER — Ambulatory Visit: Payer: Medicare Other | Attending: Otolaryngology | Admitting: Physical Therapy

## 2022-04-08 VITALS — BP 129/73 | HR 69

## 2022-04-08 DIAGNOSIS — R42 Dizziness and giddiness: Secondary | ICD-10-CM | POA: Insufficient documentation

## 2022-04-08 DIAGNOSIS — H8111 Benign paroxysmal vertigo, right ear: Secondary | ICD-10-CM | POA: Diagnosis present

## 2022-04-12 ENCOUNTER — Encounter: Payer: Self-pay | Admitting: Physical Therapy

## 2022-04-12 ENCOUNTER — Ambulatory Visit: Payer: Medicare Other | Admitting: Physical Therapy

## 2022-04-12 DIAGNOSIS — R42 Dizziness and giddiness: Secondary | ICD-10-CM | POA: Diagnosis not present

## 2022-04-12 DIAGNOSIS — H8111 Benign paroxysmal vertigo, right ear: Secondary | ICD-10-CM

## 2022-04-12 NOTE — Therapy (Signed)
OUTPATIENT PHYSICAL THERAPY VESTIBULAR TREATMENT     Patient Name: Jeanette Rodriguez MRN: [id number] DOB:05/11/1955, 67 y.o., female Today's Date: 04/12/2022  END OF SESSION:  PT End of Session - 04/12/22 1105     Visit Number 2    Number of Visits 5    Date for PT Re-Evaluation 05/08/22    Authorization Type Medicare - Part A & B    PT Start Time [id number]    PT Stop Time 1130   full time not used due to resolution of BPPV   PT Time Calculation (min) 27 min    Activity Tolerance Patient tolerated treatment well    Behavior During Therapy WFL for tasks assessed/performed             Past Medical History:  Diagnosis Date   Anxiety    Arthritis    Asthma    Depression    Fibromyalgia    GERD (gastroesophageal reflux disease)    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 [place]   Hypertension    Pneumonia    Past Surgical History:  Procedure Laterality Date   ANKLE SURGERY     x3 right   2007, 2009,2011   TONSILLECTOMY     TOTAL KNEE ARTHROPLASTY Right 08/03/2020   Procedure: TOTAL KNEE ARTHROPLASTY;  Surgeon: Gaynelle Arabian, MD;  Location: [facility];  Service: Orthopedics;  Laterality: Right;  45mn   TOTAL KNEE ARTHROPLASTY Left 08/02/2021   Procedure: TOTAL KNEE ARTHROPLASTY;  Surgeon: AGaynelle Arabian MD;  Location: [facility];  Service: Orthopedics;  Laterality: Left;   Patient Active Problem List   Diagnosis Date Noted   Osteoarthritis of left knee 08/02/2021   Primary osteoarthritis of knee 08/04/2020   OA (osteoarthritis) of knee 08/03/2020    PCP: WJonathon Jordan MD   REFERRING PROVIDER: BMelida Quitter MD   REFERRING DIAG: Diagnosis H81.10 (ICD-10-CM) - BPPV (benign paroxysmal positional vertigo)    THERAPY DIAG:  Dizziness and giddiness  BPPV (benign paroxysmal positional vertigo), right  ONSET DATE: 03/30/2022   Rationale for Evaluation and Treatment: Rehabilitation  SUBJECTIVE:   SUBJECTIVE STATEMENT:  Reports spinning dizziness  has gotten better. Getting in and out of bed has been better. It is not completely gone. Feels like she is in a fog- like the dizziness is about to come on. Sees Dr. BRedmond Basemanlater this week.     Pt accompanied by: self  PERTINENT HISTORY: PMH: Anxiety, Depression, Fibromyalgia, HTN, R total knee 07/2020, L total knee 2023   [name] reported the specific concern of motion provoked dizziness to her right side which began in May of 2023.   Recently saw Audiology on 03/21/22 and was diagnosed with positive DixHallpike to R   PAIN:  Are you having pain? Yes: NPRS scale: 5/10 Pain location: R shoulder Aggravating factors: Movement  Took pain pills earlier today.   There were no vitals filed for this visit.    PRECAUTIONS: Fall  WEIGHT BEARING RESTRICTIONS: No  FALLS: Has patient fallen in last 6 months? Yes. Number of falls 1  LIVING ENVIRONMENT: Lives with: lives with their spouse Lives in: House/apartment Stairs: Yes: Internal: 12 steps; on right going up  PLOF: Independent and takes incr time getting dressed due to R shoulder pain  PATIENT GOALS: Wants vertigo to go away   OBJECTIVE:     COGNITION: Overall cognitive status: Within functional limits for tasks assessed  POSTURE:  rounded shoulders and forward head  Cervical ROM:  WFL    GAIT: Gait pattern: step through pattern, antalgic, and wide BOS Distance walked: Clinic distances  Assistive device utilized: None Level of assistance: SBA Comments: Pt with antalgic gait pattern due to hx of knee replacements. Pt able to ambulate in and out of session with no issues.    PATIENT SURVEYS:  FOTO DPS: 46, DFS: 53.9   VESTIBULAR ASSESSMENT:    POSITIONAL TESTING: Right Dix-Hallpike: no nystagmus Left Dix-Hallpike: no nystagmus Right Roll Test: no nystagmus Left Roll Test: no nystagmus Right Sidelying: no nystagmus Left Sidelying: no nystagmus  No dizziness in any position.   VESTIBULAR TREATMENT:                                                                                                    DATE: 04/12/22  Educated on resolution of BPPV. Discussed reoccurrence rates of BPPV and that if BPPV returns in future, pt can perform with self Epley or can get a new referral to return to PT. Showed pt and provided handout on self-Epley for home. Answered pt's questions in regards to BPPV.   PATIENT EDUCATION: Education details: See above.  Person educated: Patient Education method: Customer service manager Education comprehension: verbalized understanding  HOME EXERCISE PROGRAM: Will provide in future as appropriate.   GOALS: Goals reviewed with patient? Yes  SHORT TERM GOALS: ALL STGS = LTGS   LONG TERM GOALS: Target date: 05/06/2022  Pt will have negative positional testing in order to demo resolution of R BPPV to decr dizziness.  Baseline:  Goal status: MET  2.  Pt will improve DPS to at least a 59 in order to demo improved functional outcomes.  Baseline: 46 Goal status: INITIAL  3.  Pt will demo understanding of self Epley maneuver for home in case BPPV returns in the future.  Baseline:  Goal status: MET    ASSESSMENT:  CLINICAL IMPRESSION: Re-assessed positional testing today with pt demonstrating resolution of R posterior canal BPPV. Pt negative with all positional testing and no dizziness. Educated on self-Epley for home in case BPPV returns in the future and answered pt's questions in regards to BPPV. Pt has also not had any dizziness at home. Discussed will schedule one more visit at the end of next week just in case. Pt will either keep appt or call to cancel if BPPV does not come back. Pt in agreement of plan.    OBJECTIVE IMPAIRMENTS: decreased activity tolerance, decreased balance, dizziness, and postural dysfunction.   ACTIVITY LIMITATIONS: bending, bed mobility, and locomotion level  PARTICIPATION LIMITATIONS: community activity  PERSONAL FACTORS: Behavior  pattern, Past/current experiences, Time since onset of injury/illness/exacerbation, and 3+ comorbidities:  Anxiety, Depression, Fibromyalgia, HTN, R total knee 07/2020, L total knee 2023   are also affecting patient's functional outcome.   REHAB POTENTIAL: Good  CLINICAL DECISION MAKING: Stable/uncomplicated  EVALUATION COMPLEXITY: Low   PLAN:  PT FREQUENCY: 1-2x/week  PT DURATION: 4 weeks  PLANNED INTERVENTIONS: Therapeutic exercises, Therapeutic activity, Neuromuscular re-education, Balance training, Gait training, Patient/Family education, Self Care, Vestibular training, and Canalith repositioning  PLAN FOR NEXT SESSION:  how is BPPV? D/C if gone, pt will follow up    Arliss Journey, PT, DPT  04/12/2022, 11:35 AM

## 2022-04-14 ENCOUNTER — Ambulatory Visit: Payer: Medicare Other | Admitting: Physical Therapy

## 2022-04-19 ENCOUNTER — Other Ambulatory Visit (HOSPITAL_BASED_OUTPATIENT_CLINIC_OR_DEPARTMENT_OTHER): Payer: Self-pay

## 2022-04-20 ENCOUNTER — Other Ambulatory Visit (HOSPITAL_BASED_OUTPATIENT_CLINIC_OR_DEPARTMENT_OTHER): Payer: Self-pay

## 2022-04-21 ENCOUNTER — Other Ambulatory Visit (HOSPITAL_BASED_OUTPATIENT_CLINIC_OR_DEPARTMENT_OTHER): Payer: Self-pay

## 2022-04-21 MED ORDER — OXYCODONE-ACETAMINOPHEN 10-325 MG PO TABS
1.0000 | ORAL_TABLET | Freq: Three times a day (TID) | ORAL | 0 refills | Status: DC | PRN
  Filled 2022-04-21: qty 90, 30d supply, fill #0

## 2022-04-22 ENCOUNTER — Ambulatory Visit: Payer: Medicare Other | Admitting: Physical Therapy

## 2022-04-26 ENCOUNTER — Other Ambulatory Visit (HOSPITAL_BASED_OUTPATIENT_CLINIC_OR_DEPARTMENT_OTHER): Payer: Self-pay

## 2022-04-26 ENCOUNTER — Other Ambulatory Visit: Payer: Self-pay

## 2022-04-27 ENCOUNTER — Ambulatory Visit
Admission: RE | Admit: 2022-04-27 | Discharge: 2022-04-27 | Disposition: A | Discharge status: Home / Self Care | Payer: Medicare Other | Source: Ambulatory Visit | Attending: Orthopedic Surgery | Admitting: Orthopedic Surgery

## 2022-04-27 DIAGNOSIS — M19011 Primary osteoarthritis, right shoulder: Secondary | ICD-10-CM

## 2022-04-28 ENCOUNTER — Other Ambulatory Visit (HOSPITAL_BASED_OUTPATIENT_CLINIC_OR_DEPARTMENT_OTHER): Payer: Self-pay

## 2022-04-28 MED ORDER — GABAPENTIN 300 MG PO CAPS
300.0000 mg | ORAL_CAPSULE | Freq: Three times a day (TID) | ORAL | 1 refills | Status: DC
  Filled 2022-04-28: qty 270, 90d supply, fill #0

## 2022-05-04 ENCOUNTER — Other Ambulatory Visit (HOSPITAL_BASED_OUTPATIENT_CLINIC_OR_DEPARTMENT_OTHER): Payer: Self-pay

## 2022-05-04 MED ORDER — OXYCODONE-ACETAMINOPHEN 10-325 MG PO TABS
1.0000 | ORAL_TABLET | Freq: Four times a day (QID) | ORAL | 0 refills | Status: DC | PRN
  Filled 2022-05-04: qty 120, 30d supply, fill #0

## 2022-05-04 MED ORDER — DIAZEPAM 5 MG PO TABS
5.0000 mg | ORAL_TABLET | ORAL | 0 refills | Status: DC
  Filled 2022-05-04: qty 2, 2d supply, fill #0

## 2022-05-09 NOTE — Progress Notes (Signed)
Sent message, via epic in basket, requesting orders in epic from surgeon.  

## 2022-05-11 ENCOUNTER — Encounter (HOSPITAL_BASED_OUTPATIENT_CLINIC_OR_DEPARTMENT_OTHER): Payer: Self-pay | Admitting: Pharmacist

## 2022-05-11 ENCOUNTER — Other Ambulatory Visit (HOSPITAL_BASED_OUTPATIENT_CLINIC_OR_DEPARTMENT_OTHER): Payer: Self-pay

## 2022-05-11 MED ORDER — WEGOVY 2.4 MG/0.75ML ~~LOC~~ SOAJ
2.4000 mg | SUBCUTANEOUS | 3 refills | Status: DC
  Filled 2022-05-11 – 2022-08-09 (×2): qty 3, 28d supply, fill #0
  Filled 2022-08-22 – 2022-08-30 (×2): qty 3, 28d supply, fill #1
  Filled 2022-09-28: qty 3, 28d supply, fill #2
  Filled 2022-10-28: qty 3, 28d supply, fill #3

## 2022-05-13 NOTE — Patient Instructions (Signed)
DUE TO COVID-19 ONLY TWO VISITORS  (aged 67 and older)  ARE ALLOWED TO COME WITH YOU AND STAY IN THE WAITING ROOM ONLY DURING PRE OP AND PROCEDURE.   **NO VISITORS ARE ALLOWED IN THE SHORT STAY AREA OR RECOVERY ROOM!!**  IF YOU WILL BE ADMITTED INTO THE HOSPITAL YOU ARE ALLOWED ONLY FOUR SUPPORT PEOPLE DURING VISITATION HOURS ONLY (7 AM -8PM)   The support person(s) must pass our screening, gel in and out, and wear a mask at all times, including in the patient's room. Patients must also wear a mask when staff or their support person are in the room. Visitors GUEST BADGE MUST BE WORN VISIBLY  One adult visitor may remain with you overnight and MUST be in the room by 8 P.M.     Your procedure is scheduled on: 06/02/22   Report to [facility] Main Entrance    Report to admitting at : 5:15 AM   Call this number if you have problems the morning of surgery [phone]   Do not eat food :After Midnight.   After Midnight you may have the following liquids until : 4:30 AM DAY OF SURGERY  Water Black Coffee (sugar ok, NO MILK/CREAM OR CREAMERS)  Tea (sugar ok, NO MILK/CREAM OR CREAMERS) regular and decaf                             Plain Jell-O (NO RED)                                           Fruit ices (not with fruit pulp, NO RED)                                     Popsicles (NO RED)                                                                  Juice: apple, WHITE grape, WHITE cranberry Sports drinks like Gatorade (NO RED)   The day of surgery:  Drink ONE (1) Pre-Surgery Clear Ensure at : 4:30 AM the morning of surgery. Drink in one sitting. Do not sip.  This drink was given to you during your hospital  pre-op appointment visit. Nothing else to drink after completing the  Pre-Surgery Clear Ensure or G2.          If you have questions, please contact your surgeon's office.    Oral Hygiene is also important to reduce your risk of infection.                                     Remember - BRUSH YOUR TEETH THE MORNING OF SURGERY WITH YOUR REGULAR TOOTHPASTE  DENTURES WILL BE REMOVED PRIOR TO SURGERY PLEASE DO NOT APPLY "Poly grip" OR ADHESIVES!!!   Do NOT smoke after Midnight   Take these medicines the morning of surgery with A SIP OF WATER:gabapentin,cetirizine.   DO NOT TAKE THE FOLLOWING  7 DAYS PRIOR TO SURGERY: Ozempic, Wegovy, Rybelsus (Semaglutide), Byetta (exenatide), Bydureon (exenatide ER), Victoza, Saxenda (liraglutide), or Trulicity (dulaglutide) Mounjaro (Tirzepatide) Adlyxin (Lixisenatide), Polyethylene Glycol Loxenatide.                              You may not have any metal on your body including hair pins, jewelry, and body piercing             Do not wear make-up, lotions, powders, perfumes/cologne, or deodorant  Do not wear nail polish including gel and S&S, artificial/acrylic nails, or any other type of covering on natural nails including finger and toenails. If you have artificial nails, gel coating, etc. that needs to be removed by a nail salon please have this removed prior to surgery or surgery may need to be canceled/ delayed if the surgeon/ anesthesia feels like they are unable to be safely monitored.   Do not shave  48 hours prior to surgery.    Do not bring valuables to the hospital. [place].   Contacts, glasses, or bridgework may not be worn into surgery.   Bring small overnight bag day of surgery.   DO NOT [place]. PHARMACY WILL DISPENSE MEDICATIONS LISTED ON YOUR MEDICATION LIST TO YOU DURING YOUR ADMISSION [place]!    Patients discharged on the day of surgery will not be allowed to drive home.  Someone NEEDS to stay with you for the first 24 hours after anesthesia.   Special Instructions: Bring a copy of your healthcare power of attorney and living will documents         the day of surgery if you haven't scanned them before.               Please read over the following fact sheets you were given: IF YOU HAVE QUESTIONS ABOUT YOUR PRE-OP INSTRUCTIONS PLEASE CALL [phone]    [facility] - Preparing for Surgery Before surgery, you can play an important role.  Because skin is not sterile, your skin needs to be as free of germs as possible.  You can reduce the number of germs on your skin by washing with CHG (chlorahexidine gluconate) soap before surgery.  CHG is an antiseptic cleaner which kills germs and bonds with the skin to continue killing germs even after washing. Please DO NOT use if you have an allergy to CHG or antibacterial soaps.  If your skin becomes reddened/irritated stop using the CHG and inform your nurse when you arrive at Short Stay. Do not shave (including legs and underarms) for at least 48 hours prior to the first CHG shower.  You may shave your face/neck. Please follow these instructions carefully:  1.  Shower with CHG Soap the night before surgery and the  morning of Surgery.  2.  If you choose to wash your hair, wash your hair first as usual with your  normal  shampoo.  3.  After you shampoo, rinse your hair and body thoroughly to remove the  shampoo.                           4.  Use CHG as you would any other liquid soap.  You can apply chg directly  to the skin and wash  Gently with a scrungie or clean washcloth.  5.  Apply the CHG Soap to your body ONLY FROM THE NECK DOWN.   Do not use on face/ open                           Wound or open sores. Avoid contact with eyes, ears mouth and genitals (private parts).                       Wash face,  Genitals (private parts) with your normal soap.             6.  Wash thoroughly, paying special attention to the area where your surgery  will be performed.  7.  Thoroughly rinse your body with warm water from the neck down.  8.  DO NOT shower/wash with your normal soap after using and rinsing off  the CHG Soap.                9.  Pat  yourself dry with a clean towel.            10.  Wear clean pajamas.            11.  Place clean sheets on your bed the night of your first shower and do not  sleep with pets. Day of Surgery : Do not apply any lotions/deodorants the morning of surgery.  Please wear clean clothes to the hospital/surgery center.  FAILURE TO FOLLOW THESE INSTRUCTIONS MAY RESULT IN THE CANCELLATION OF YOUR SURGERY PATIENT SIGNATURE_________________________________  NURSE SIGNATURE__________________________________  ________________________________________________________________________[facility]- Preparing for Total Shoulder Arthroplasty    Before surgery, you can play an important role. Because skin is not sterile, your skin needs to be as free of germs as possible. You can reduce the number of germs on your skin by using the following products. Benzoyl Peroxide Gel Reduces the number of germs present on the skin Applied twice a day to shoulder area starting two days before surgery    ==================================================================  Please follow these instructions carefully:  BENZOYL PEROXIDE 5% GEL  Please do not use if you have an allergy to benzoyl peroxide.   If your skin becomes reddened/irritated stop using the benzoyl peroxide.  Starting two days before surgery, apply as follows: Apply benzoyl peroxide in the morning and at night. Apply after taking a shower. If you are not taking a shower clean entire shoulder front, back, and side along with the armpit with a clean wet washcloth.  Place a quarter-sized dollop on your shoulder and rub in thoroughly, making sure to cover the front, back, and side of your shoulder, along with the armpit.   2 days before ____ AM   ____ PM              1 day before ____ AM   ____ PM                         Do this twice a day for two days.  (Last application is the night before surgery, AFTER using the CHG soap as described below).  Do NOT  apply benzoyl peroxide gel on the day of surgery.  Incentive Spirometer  An incentive spirometer is a tool that can help keep your lungs clear and active. This tool measures how well you are filling your lungs with each breath. Taking long deep breaths may help reverse or  decrease the chance of developing breathing (pulmonary) problems (especially infection) following: A long period of time when you are unable to move or be active. BEFORE THE PROCEDURE  If the spirometer includes an indicator to show your best effort, your nurse or respiratory therapist will set it to a desired goal. If possible, sit up straight or lean slightly forward. Try not to slouch. Hold the incentive spirometer in an upright position. INSTRUCTIONS FOR USE  Sit on the edge of your bed if possible, or sit up as far as you can in bed or on a chair. Hold the incentive spirometer in an upright position. Breathe out normally. Place the mouthpiece in your mouth and seal your lips tightly around it. Breathe in slowly and as deeply as possible, raising the piston or the ball toward the top of the column. Hold your breath for 3-5 seconds or for as long as possible. Allow the piston or ball to fall to the bottom of the column. Remove the mouthpiece from your mouth and breathe out normally. Rest for a few seconds and repeat Steps 1 through 7 at least 10 times every 1-2 hours when you are awake. Take your time and take a few normal breaths between deep breaths. The spirometer may include an indicator to show your best effort. Use the indicator as a goal to work toward during each repetition. After each set of 10 deep breaths, practice coughing to be sure your lungs are clear. If you have an incision (the cut made at the time of surgery), support your incision when coughing by placing a pillow or rolled up towels firmly against it. Once you are able to get out of bed, walk around indoors and cough well. You may stop using the  incentive spirometer when instructed by your caregiver.  RISKS AND COMPLICATIONS Take your time so you do not get dizzy or light-headed. If you are in pain, you may need to take or ask for pain medication before doing incentive spirometry. It is harder to take a deep breath if you are having pain. AFTER USE Rest and breathe slowly and easily. It can be helpful to keep track of a log of your progress. Your caregiver can provide you with a simple table to help with this. If you are using the spirometer at home, follow these instructions: [place] IF:  You are having difficultly using the spirometer. You have trouble using the spirometer as often as instructed. Your pain medication is not giving enough relief while using the spirometer. You develop fever of 100.5 F (38.1 C) or higher. SEEK IMMEDIATE MEDICAL CARE IF:  You cough up bloody sputum that had not been present before. You develop fever of 102 F (38.9 C) or greater. You develop worsening pain at or near the incision site. MAKE SURE YOU:  Understand these instructions. Will watch your condition. Will get help right away if you are not doing well or get worse. Document Released: 06/20/2006 Document Revised: 05/02/2011 Document Reviewed: 08/21/2006 [facility] Patient Information 2014 [place], [place].   ________________________________________________________________________

## 2022-05-16 ENCOUNTER — Other Ambulatory Visit (HOSPITAL_BASED_OUTPATIENT_CLINIC_OR_DEPARTMENT_OTHER): Payer: Self-pay

## 2022-05-16 ENCOUNTER — Encounter (HOSPITAL_COMMUNITY)
Admission: RE | Admit: 2022-05-16 | Discharge: 2022-05-16 | Disposition: A | Discharge status: Home / Self Care | Payer: Medicare Other | Source: Ambulatory Visit | Attending: Orthopedic Surgery | Admitting: Orthopedic Surgery

## 2022-05-16 ENCOUNTER — Encounter (HOSPITAL_COMMUNITY): Payer: Self-pay

## 2022-05-16 ENCOUNTER — Other Ambulatory Visit: Payer: Self-pay

## 2022-05-16 VITALS — BP 141/79 | HR 62 | Temp 98.0°F | Ht 62.0 in | Wt 158.0 lb

## 2022-05-16 DIAGNOSIS — I2510 Atherosclerotic heart disease of native coronary artery without angina pectoris: Secondary | ICD-10-CM | POA: Diagnosis not present

## 2022-05-16 DIAGNOSIS — Z01818 Encounter for other preprocedural examination: Secondary | ICD-10-CM

## 2022-05-16 DIAGNOSIS — Z01812 Encounter for preprocedural laboratory examination: Secondary | ICD-10-CM | POA: Diagnosis not present

## 2022-05-16 LAB — BASIC METABOLIC PANEL
Anion gap: 8 (ref 5–15)
BUN: 16 mg/dL (ref 8–23)
CO2: 29 mmol/L (ref 22–32)
Calcium: 9.4 mg/dL (ref 8.9–10.3)
Chloride: 102 mmol/L (ref 98–111)
Creatinine, Ser: 0.66 mg/dL (ref 0.44–1.00)
GFR, Estimated: >60 mL/min (ref >60)
Glucose, Bld: 88 mg/dL (ref 70–99)
Potassium: 4.3 mmol/L (ref 3.5–5.1)
Sodium: 139 mmol/L (ref 135–145)

## 2022-05-16 LAB — CBC
HCT: 39.9 % (ref 36.0–46.0)
Hemoglobin: 12.8 g/dL (ref 12.0–15.0)
MCH: 28.8 pg (ref 26.0–34.0)
MCHC: 32.1 g/dL (ref 30.0–36.0)
MCV: 89.9 fL (ref 80.0–100.0)
Platelets: 388 10*3/uL (ref 150–400)
RBC: 4.44 MIL/uL (ref 3.87–5.11)
RDW: 13.2 % (ref 11.5–15.5)
WBC: 5.4 10*3/uL (ref 4.0–10.5)
nRBC: 0 % (ref 0.0–0.2)

## 2022-05-16 LAB — SURGICAL PCR SCREEN
MRSA, PCR: NEGATIVE
Staphylococcus aureus: NEGATIVE

## 2022-05-16 NOTE — Progress Notes (Signed)
For Short Stay: [place] appointment date:  Bowel Prep reminder:   For Anesthesia: PCP - Dr. Jonathon Jordan. Cardiologist - N/A  Chest x-ray -  EKG - 08/02/21 Stress Test -  ECHO -  Cardiac Cath -  Pacemaker/ICD device last checked: Pacemaker orders received: Device Rep notified:  Spinal Cord Stimulator:  Sleep Study - N/A CPAP -   Fasting Blood Sugar - N/A Checks Blood Sugar ___0__ times a day Date and result of last Hgb A1c-  Last dose of GLP1 agonist- Semaglutide: last dose 05/23/22 GLP1 instructions:   Last dose of SGLT-2 inhibitors-  SGLT-2 instructions:   Blood Thinner Instructions: Aspirin Instructions: Last Dose:  Activity level: Can go up a flight of stairs and activities of daily living without stopping and without chest pain and/or shortness of breath   Able to exercise without chest pain and/or shortness of breath  Anesthesia review: Hx: HTN  Patient denies shortness of breath, fever, cough and chest pain at PAT appointment   Patient verbalized understanding of instructions that were given to them at the PAT appointment. Patient was also instructed that they will need to review over the PAT instructions again at home before surgery.

## 2022-05-16 NOTE — Progress Notes (Signed)
Lab results. WBC: 15.6.

## 2022-05-19 ENCOUNTER — Other Ambulatory Visit (HOSPITAL_BASED_OUTPATIENT_CLINIC_OR_DEPARTMENT_OTHER): Payer: Self-pay

## 2022-05-20 ENCOUNTER — Other Ambulatory Visit (HOSPITAL_BASED_OUTPATIENT_CLINIC_OR_DEPARTMENT_OTHER): Payer: Self-pay

## 2022-05-20 MED ORDER — OXYCODONE-ACETAMINOPHEN 10-325 MG PO TABS
1.0000 | ORAL_TABLET | Freq: Four times a day (QID) | ORAL | 0 refills | Status: DC | PRN
  Filled 2022-05-20: qty 120, 30d supply, fill #0

## 2022-05-31 ENCOUNTER — Other Ambulatory Visit (HOSPITAL_BASED_OUTPATIENT_CLINIC_OR_DEPARTMENT_OTHER): Payer: Self-pay

## 2022-06-02 ENCOUNTER — Encounter (HOSPITAL_COMMUNITY)
Admission: RE | Disposition: A | Discharge status: Home / Self Care | Payer: Self-pay | Source: Home / Self Care | Attending: Orthopedic Surgery

## 2022-06-02 ENCOUNTER — Other Ambulatory Visit: Payer: Self-pay

## 2022-06-02 ENCOUNTER — Ambulatory Visit (HOSPITAL_COMMUNITY)
Admission: RE | Admit: 2022-06-02 | Discharge: 2022-06-02 | Disposition: A | Discharge status: Home / Self Care | Payer: Medicare Other | Attending: Orthopedic Surgery | Admitting: Orthopedic Surgery

## 2022-06-02 ENCOUNTER — Other Ambulatory Visit (HOSPITAL_BASED_OUTPATIENT_CLINIC_OR_DEPARTMENT_OTHER): Payer: Self-pay

## 2022-06-02 ENCOUNTER — Ambulatory Visit (HOSPITAL_COMMUNITY): Payer: Medicare Other | Admitting: Anesthesiology

## 2022-06-02 ENCOUNTER — Encounter (HOSPITAL_COMMUNITY): Payer: Self-pay | Admitting: Orthopedic Surgery

## 2022-06-02 ENCOUNTER — Ambulatory Visit (HOSPITAL_BASED_OUTPATIENT_CLINIC_OR_DEPARTMENT_OTHER): Payer: Medicare Other | Admitting: Anesthesiology

## 2022-06-02 DIAGNOSIS — Z87891 Personal history of nicotine dependence: Secondary | ICD-10-CM | POA: Insufficient documentation

## 2022-06-02 DIAGNOSIS — Z09 Encounter for follow-up examination after completed treatment for conditions other than malignant neoplasm: Secondary | ICD-10-CM | POA: Diagnosis not present

## 2022-06-02 DIAGNOSIS — K219 Gastro-esophageal reflux disease without esophagitis: Secondary | ICD-10-CM | POA: Insufficient documentation

## 2022-06-02 DIAGNOSIS — M12811 Other specific arthropathies, not elsewhere classified, right shoulder: Secondary | ICD-10-CM | POA: Diagnosis not present

## 2022-06-02 DIAGNOSIS — M75101 Unspecified rotator cuff tear or rupture of right shoulder, not specified as traumatic: Secondary | ICD-10-CM | POA: Insufficient documentation

## 2022-06-02 DIAGNOSIS — J45909 Unspecified asthma, uncomplicated: Secondary | ICD-10-CM | POA: Insufficient documentation

## 2022-06-02 DIAGNOSIS — I10 Essential (primary) hypertension: Secondary | ICD-10-CM | POA: Diagnosis not present

## 2022-06-02 DIAGNOSIS — M19011 Primary osteoarthritis, right shoulder: Secondary | ICD-10-CM | POA: Diagnosis not present

## 2022-06-02 HISTORY — PX: REVERSE SHOULDER ARTHROPLASTY: SHX5054

## 2022-06-02 SURGERY — ARTHROPLASTY, SHOULDER, TOTAL, REVERSE
Anesthesia: Regional | Site: Shoulder | Laterality: Right

## 2022-06-02 MED ORDER — DEXAMETHASONE SODIUM PHOSPHATE 10 MG/ML IJ SOLN
INTRAMUSCULAR | Status: AC
  Filled 2022-06-02: qty 1

## 2022-06-02 MED ORDER — MIDAZOLAM HCL 2 MG/2ML IJ SOLN
2.0000 mg | Freq: Once | INTRAMUSCULAR | Status: AC
  Administered 2022-06-02: 2 mg via INTRAVENOUS
  Filled 2022-06-02: qty 2

## 2022-06-02 MED ORDER — VANCOMYCIN HCL 1000 MG IV SOLR
INTRAVENOUS | Status: DC | PRN
  Administered 2022-06-02: 1000 mg via TOPICAL

## 2022-06-02 MED ORDER — DEXAMETHASONE SODIUM PHOSPHATE 10 MG/ML IJ SOLN
INTRAMUSCULAR | Status: DC | PRN
  Administered 2022-06-02: 5 mg via INTRAVENOUS

## 2022-06-02 MED ORDER — MIDAZOLAM HCL 2 MG/2ML IJ SOLN
INTRAMUSCULAR | Status: AC
  Filled 2022-06-02: qty 2

## 2022-06-02 MED ORDER — ONDANSETRON HCL 4 MG/2ML IJ SOLN
INTRAMUSCULAR | Status: AC
  Filled 2022-06-02: qty 2

## 2022-06-02 MED ORDER — PHENYLEPHRINE HCL (PRESSORS) 10 MG/ML IV SOLN
INTRAVENOUS | Status: AC
  Filled 2022-06-02: qty 1

## 2022-06-02 MED ORDER — CHLORHEXIDINE GLUCONATE 0.12 % MT SOLN
15.0000 mL | Freq: Once | OROMUCOSAL | Status: AC
  Administered 2022-06-02: 15 mL via OROMUCOSAL

## 2022-06-02 MED ORDER — BUPIVACAINE LIPOSOME 1.3 % IJ SUSP
INTRAMUSCULAR | Status: DC | PRN
  Administered 2022-06-02: 10 mL via PERINEURAL

## 2022-06-02 MED ORDER — ONDANSETRON HCL 4 MG/2ML IJ SOLN
INTRAMUSCULAR | Status: DC | PRN
  Administered 2022-06-02: 4 mg via INTRAVENOUS

## 2022-06-02 MED ORDER — PROPOFOL 10 MG/ML IV BOLUS
INTRAVENOUS | Status: AC
  Filled 2022-06-02: qty 20

## 2022-06-02 MED ORDER — TRANEXAMIC ACID-NACL 1000-0.7 MG/100ML-% IV SOLN
INTRAVENOUS | Status: AC
  Filled 2022-06-02: qty 100

## 2022-06-02 MED ORDER — VANCOMYCIN HCL 1000 MG IV SOLR
INTRAVENOUS | Status: AC
  Filled 2022-06-02: qty 20

## 2022-06-02 MED ORDER — HYDROMORPHONE HCL 1 MG/ML IJ SOLN: 0.2500 mg | INTRAMUSCULAR | Status: DC | PRN

## 2022-06-02 MED ORDER — TRANEXAMIC ACID-NACL 1000-0.7 MG/100ML-% IV SOLN
1000.0000 mg | INTRAVENOUS | Status: AC
  Administered 2022-06-02: 1000 mg via INTRAVENOUS

## 2022-06-02 MED ORDER — LIDOCAINE HCL (CARDIAC) PF 100 MG/5ML IV SOSY
PREFILLED_SYRINGE | INTRAVENOUS | Status: DC | PRN
  Administered 2022-06-02: 40 mg via INTRAVENOUS

## 2022-06-02 MED ORDER — ONDANSETRON HCL 4 MG/2ML IJ SOLN: 4.0000 mg | Freq: Once | INTRAMUSCULAR | Status: DC | PRN

## 2022-06-02 MED ORDER — ORAL CARE MOUTH RINSE: 15.0000 mL | Freq: Once | OROMUCOSAL | Status: AC

## 2022-06-02 MED ORDER — LACTATED RINGERS IV SOLN: INTRAVENOUS | Status: DC | PRN

## 2022-06-02 MED ORDER — FENTANYL CITRATE PF 50 MCG/ML IJ SOSY
100.0000 ug | PREFILLED_SYRINGE | Freq: Once | INTRAMUSCULAR | Status: AC
  Administered 2022-06-02: 100 ug via INTRAVENOUS
  Filled 2022-06-02: qty 2

## 2022-06-02 MED ORDER — ROCURONIUM BROMIDE 100 MG/10ML IV SOLN
INTRAVENOUS | Status: DC | PRN
  Administered 2022-06-02: 80 mg via INTRAVENOUS

## 2022-06-02 MED ORDER — ACETAMINOPHEN 500 MG PO TABS: 1000.0000 mg | ORAL_TABLET | Freq: Once | ORAL | Status: AC

## 2022-06-02 MED ORDER — SUGAMMADEX SODIUM 200 MG/2ML IV SOLN
INTRAVENOUS | Status: DC | PRN
  Administered 2022-06-02: 200 mg via INTRAVENOUS

## 2022-06-02 MED ORDER — ROPIVACAINE HCL 5 MG/ML IJ SOLN
INTRAMUSCULAR | Status: DC | PRN
  Administered 2022-06-02: 15 mL via PERINEURAL

## 2022-06-02 MED ORDER — PHENYLEPHRINE HCL (PRESSORS) 10 MG/ML IV SOLN
INTRAVENOUS | Status: DC | PRN
  Administered 2022-06-02: 160 ug via INTRAVENOUS

## 2022-06-02 MED ORDER — LIDOCAINE HCL (PF) 2 % IJ SOLN
INTRAMUSCULAR | Status: AC
  Filled 2022-06-02: qty 5

## 2022-06-02 MED ORDER — GLYCOPYRROLATE 0.2 MG/ML IJ SOLN
INTRAMUSCULAR | Status: DC | PRN
  Administered 2022-06-02: .2 mg via INTRAVENOUS

## 2022-06-02 MED ORDER — ONDANSETRON HCL 4 MG PO TABS
4.0000 mg | ORAL_TABLET | Freq: Three times a day (TID) | ORAL | 0 refills | Status: DC | PRN
  Filled 2022-06-02: qty 10, 4d supply, fill #0

## 2022-06-02 MED ORDER — PROPOFOL 10 MG/ML IV BOLUS
INTRAVENOUS | Status: DC | PRN
  Administered 2022-06-02: 150 mg via INTRAVENOUS

## 2022-06-02 MED ORDER — ROCURONIUM BROMIDE 10 MG/ML (PF) SYRINGE
PREFILLED_SYRINGE | INTRAVENOUS | Status: AC
  Filled 2022-06-02: qty 10

## 2022-06-02 MED ORDER — OXYCODONE HCL 5 MG/5ML PO SOLN: 5.0000 mg | Freq: Once | ORAL | Status: DC | PRN

## 2022-06-02 MED ORDER — CEFAZOLIN SODIUM-DEXTROSE 2-4 GM/100ML-% IV SOLN
INTRAVENOUS | Status: AC
  Filled 2022-06-02: qty 100

## 2022-06-02 MED ORDER — PHENYLEPHRINE HCL-NACL 20-0.9 MG/250ML-% IV SOLN
INTRAVENOUS | Status: DC | PRN
  Administered 2022-06-02: 50 ug/min via INTRAVENOUS

## 2022-06-02 MED ORDER — ACETAMINOPHEN 500 MG PO TABS
ORAL_TABLET | ORAL | Status: AC
  Administered 2022-06-02: 1000 mg via ORAL
  Filled 2022-06-02: qty 2

## 2022-06-02 MED ORDER — CEFAZOLIN SODIUM-DEXTROSE 2-4 GM/100ML-% IV SOLN
2.0000 g | INTRAVENOUS | Status: AC
  Administered 2022-06-02: 2 g via INTRAVENOUS

## 2022-06-02 MED ORDER — MIDAZOLAM HCL 5 MG/5ML IJ SOLN
INTRAMUSCULAR | Status: DC | PRN
  Administered 2022-06-02: 2 mg via INTRAVENOUS

## 2022-06-02 MED ORDER — OXYCODONE HCL 5 MG PO TABS: 5.0000 mg | ORAL_TABLET | Freq: Once | ORAL | Status: DC | PRN

## 2022-06-02 MED ORDER — AMISULPRIDE (ANTIEMETIC) 5 MG/2ML IV SOLN: 10.0000 mg | Freq: Once | INTRAVENOUS | Status: DC | PRN

## 2022-06-02 MED ORDER — CYCLOBENZAPRINE HCL 10 MG PO TABS
10.0000 mg | ORAL_TABLET | Freq: Three times a day (TID) | ORAL | 1 refills | Status: DC | PRN
  Filled 2022-06-02: qty 30, 10d supply, fill #0

## 2022-06-02 MED ORDER — 0.9 % SODIUM CHLORIDE (POUR BTL) OPTIME
TOPICAL | Status: DC | PRN
  Administered 2022-06-02: 1000 mL

## 2022-06-02 MED ORDER — NAPROXEN 500 MG PO TABS
500.0000 mg | ORAL_TABLET | Freq: Two times a day (BID) | ORAL | 1 refills | Status: DC
  Filled 2022-06-02: qty 60, 30d supply, fill #0

## 2022-06-02 MED ORDER — LACTATED RINGERS IV SOLN: INTRAVENOUS | Status: DC

## 2022-06-02 MED ORDER — HYDROMORPHONE HCL 2 MG PO TABS
2.0000 mg | ORAL_TABLET | ORAL | 0 refills | Status: DC | PRN
  Filled 2022-06-02: qty 30, 5d supply, fill #0

## 2022-06-02 MED ORDER — TRANEXAMIC ACID 1000 MG/10ML IV SOLN: 1000.0000 mg | INTRAVENOUS | Status: DC

## 2022-06-02 SURGICAL SUPPLY — 73 items
ADH SKN CLS APL DERMABOND .7 (GAUZE/BANDAGES/DRESSINGS) ×2
AID PSTN UNV HD RSTRNT DISP (MISCELLANEOUS) ×1
BAG COUNTER SPONGE SURGICOUNT (BAG) IMPLANT
BAG SPEC THK2 15X12 ZIP CLS (MISCELLANEOUS) ×1
BAG SPNG CNTER NS LX DISP (BAG) ×1
BAG ZIPLOCK 12X15 (MISCELLANEOUS) ×1 IMPLANT
BASEPLATE AUG FULL 24 20D (Plate) IMPLANT
BIT DRILL AR 3 (BIT) ×1
BIT DRILL AR 3 NS (BIT) IMPLANT
BLADE SAW SGTL 83.5X18.5 (BLADE) ×1 IMPLANT
BNDG CMPR 5X4 CHSV STRCH STRL (GAUZE/BANDAGES/DRESSINGS) ×1
BNDG COHESIVE 4X5 TAN STRL LF (GAUZE/BANDAGES/DRESSINGS) ×1 IMPLANT
BSPLAT GLND 20D OBLQ 24 FULL (Plate) ×1 IMPLANT
CALIBRATOR GLENOID VIP 5-D (SYSTAGENIX WOUND MANAGEMENT) IMPLANT
CEMENT BONE DEPUY (Cement) IMPLANT
COOLER ICEMAN CLASSIC (MISCELLANEOUS) ×1 IMPLANT
COVER BACK TABLE 60X90IN (DRAPES) ×1 IMPLANT
COVER SURGICAL LIGHT HANDLE (MISCELLANEOUS) ×1 IMPLANT
CUP SUT UNIV REVERS 36 NEUTRAL (Cup) IMPLANT
DERMABOND ADVANCED .7 DNX12 (GAUZE/BANDAGES/DRESSINGS) ×1 IMPLANT
DRAPE ORTHO SPLIT 77X108 STRL (DRAPES) ×2
DRAPE SHEET LG 3/4 BI-LAMINATE (DRAPES) ×1 IMPLANT
DRAPE SURG 17X11 SM STRL (DRAPES) ×1 IMPLANT
DRAPE SURG ORHT 6 SPLT 77X108 (DRAPES) ×2 IMPLANT
DRAPE TOP 10253 STERILE (DRAPES) ×1 IMPLANT
DRAPE U-SHAPE 47X51 STRL (DRAPES) ×1 IMPLANT
DRESSING AQUACEL AG SP 3.5X6 (GAUZE/BANDAGES/DRESSINGS) ×1 IMPLANT
DRSG AQUACEL AG ADV 3.5X10 (GAUZE/BANDAGES/DRESSINGS) IMPLANT
DRSG AQUACEL AG SP 3.5X6 (GAUZE/BANDAGES/DRESSINGS) ×1
DURAPREP 26ML APPLICATOR (WOUND CARE) ×1 IMPLANT
ELECT BLADE TIP CTD 4 INCH (ELECTRODE) ×1 IMPLANT
ELECT PENCIL ROCKER SW 15FT (MISCELLANEOUS) ×1 IMPLANT
ELECT REM PT RETURN 15FT ADLT (MISCELLANEOUS) ×1 IMPLANT
FACESHIELD WRAPAROUND (MASK) ×5 IMPLANT
FACESHIELD WRAPAROUND OR TEAM (MASK) ×5 IMPLANT
GLENOSPHERE 36 +4 LAT/24 (Joint) IMPLANT
GLOVE BIO SURGEON STRL SZ7.5 (GLOVE) ×1 IMPLANT
GLOVE BIO SURGEON STRL SZ8 (GLOVE) ×1 IMPLANT
GLOVE SS BIOGEL STRL SZ 7 (GLOVE) ×1 IMPLANT
GLOVE SS BIOGEL STRL SZ 7.5 (GLOVE) ×1 IMPLANT
GOWN STRL SURGICAL XL XLNG (GOWN DISPOSABLE) ×2 IMPLANT
INSERT HUMERAL UNI REVERS 36 6 (Insert) IMPLANT
KIT BASIN OR (CUSTOM PROCEDURE TRAY) ×1 IMPLANT
KIT TURNOVER KIT A (KITS) IMPLANT
MANIFOLD NEPTUNE II (INSTRUMENTS) ×1 IMPLANT
NDL TAPERED W/ NITINOL LOOP (MISCELLANEOUS) ×1 IMPLANT
NEEDLE TAPERED W/ NITINOL LOOP (MISCELLANEOUS) ×1 IMPLANT
NS IRRIG 1000ML POUR BTL (IV SOLUTION) ×1 IMPLANT
PACK SHOULDER (CUSTOM PROCEDURE TRAY) ×1 IMPLANT
PAD ARMBOARD 7.5X6 YLW CONV (MISCELLANEOUS) ×1 IMPLANT
PAD COLD SHLDR WRAP-ON (PAD) ×1 IMPLANT
PIN NITINOL TARGETER 2.8 (PIN) IMPLANT
PIN SET MODULAR GLENOID SYSTEM (PIN) IMPLANT
POST MODULAR 25 (Post) ×1 IMPLANT
POST MODULAR MGS BASEPLATE 25 (Post) IMPLANT
REAMER ANGLED HEAD SMALL (DRILL) IMPLANT
RESTRAINT HEAD UNIVERSAL NS (MISCELLANEOUS) ×1 IMPLANT
SCREW PERIPHERAL 5.5X20 LOCK (Screw) IMPLANT
SCREW PERIPHERAL 5.5X28 LOCK (Screw) IMPLANT
SCREW PERIPHERAL NL 4.5X20 (Screw) IMPLANT
SLING ARM FOAM STRAP LRG (SOFTGOODS) IMPLANT
SLING ARM FOAM STRAP MED (SOFTGOODS) IMPLANT
STEM HUMERAL UNI REVERS SZ9 (Stem) IMPLANT
SUT MNCRL AB 3-0 PS2 18 (SUTURE) ×1 IMPLANT
SUT MON AB 2-0 CT1 36 (SUTURE) ×1 IMPLANT
SUT VIC AB 1 CT1 36 (SUTURE) ×1 IMPLANT
SUTURE TAPE 1.3 40 TPR END (SUTURE) ×2 IMPLANT
SUTURETAPE 1.3 40 TPR END (SUTURE) ×2
TOWEL OR 17X26 10 PK STRL BLUE (TOWEL DISPOSABLE) ×1 IMPLANT
TOWEL OR NON WOVEN STRL DISP B (DISPOSABLE) ×1 IMPLANT
TUBE SUCTION HIGH CAP CLEAR NV (SUCTIONS) ×1 IMPLANT
TUBING CONNECTING 10 (TUBING) IMPLANT
WATER STERILE IRR 1000ML POUR (IV SOLUTION) ×2 IMPLANT

## 2022-06-02 NOTE — Op Note (Signed)
06/02/2022  1:21 PM  PATIENT:   Jeanette Rodriguez  67 y.o. female  PRE-OPERATIVE DIAGNOSIS:  Right shoulder rotator cuff tear arthropathy  POST-OPERATIVE DIAGNOSIS: Same with underlying severe glenoid deformity  PROCEDURE: Right shoulder reverse arthroplasty utilizing a press-fit size 9 Arthrex stem with a neutral metaphysis, +6 constrained polyethylene insert, 20 degree augmented baseplate and 36/+4 glenosphere  SURGEON:  Marquitta Persichetti, Vania Rea M.D.  ASSISTANTS: Ralene Bathe, PA-C  Ralene Bathe, PA-C was utilized as an Geophysicist/field seismologist throughout this case, essential for help with positioning the patient, positioning extremity, tissue manipulation, implantation of the prosthesis, suture management, wound closure, and intraoperative decision-making.  ANESTHESIA:   General endotracheal and interscalene block with Exparel  EBL: 100 cc  SPECIMEN: None  Drains: None   PATIENT DISPOSITION:  PACU - hemodynamically stable.    PLAN OF CARE: Discharge to home after PACU  Brief history:  Patient is a 67 year old female with chronic and progressively increasing right shoulder pain related to severe osteoarthritis with significant bony deformity of the glenoid with retroversion and humeral head subluxation.  Her clinical exam demonstrates profoundly restricted motion.  Due to her increasing pain and functional limitations and failure to respond to block attempted conservative management, she is brought to the operating room today for planned right shoulder reverse arthroplasty.  Preoperatively, I counseled the patient regarding treatment options and risks versus benefits thereof.  Possible surgical complications were all reviewed including potential for bleeding, infection, neurovascular injury, persistent pain, loss of motion, anesthetic complication, failure of the implant, and possible need for additional surgery. They understand and accept and agrees with our planned procedure.   Procedure  detail:  After undergoing routine preop evaluation the patient received prophylactic antibiotics and interscalene block with Exparel was established in the holding area by the anesthesia department.  Subsequently placed spine on the operating table and underwent the smooth induction of a general endotracheal anesthesia.  Placed in the beachchair position and appropriately padded and protected.  The right shoulder girdle region was sterilely prepped and draped in standard fashion.  Timeout was called.  Deltopectoral approach to the right shoulder is made an approximate 8 cm incision.  Skin flaps elevated dissection carried deeply the deltopectoral interval was then developed from proximal to this with the vein taken laterally.  The conjoined tendon was mobilized and retracted medially and the long head biceps tendon was then tenodesed at the upper border the pectoralis major tendon with the proximal segment unroofed and excised.  The rotator cuff was then split from the apex of the bicipital groove to the base of the coracoid and the subscapularis was then separated from the lesser tuberosity using electrocautery and tagged with a pair of grasping suture tape sutures.  Capsular attachments were then divided from the anterior and inferior margins of the humeral neck and the humeral head was then delivered through the wound.  An extra medullary guide was then used to outline the proposed humeral head resection which we performed with an oscillating saw at approximately 20 degrees retroversion.  Marginal osteophytes were removed with rongeurs and a metal cap placed over the cut proximal humeral surface and we then exposed the glenoid and performed a circumferential labral resection.  Of note there was severe synovitis and extensive synovectomy was performed.  Glenoid showed severe retroversion and based on our preoperative CT scan and planning we made a correction using the alignment guide and the guidepin was then  directed into the glenoid allowing [place] to correct for this severe  retroversion and superior inclination.  We then used our 20 degree reamer to prepare the glenoid and this was positioned in accordance with the preoperative plan based on her CT scan.  A 25 mm central peg was then selected based on the operative findings.  The central reamer was used to open up the glenoid vault.  At this point our baseplate was then assembled and impacted with excellent purchase and fixation.  We then placed an initial inferior nonlocking screw and an additional 3 locking screws which all achieved excellent purchase and fixation.  A 36/+4 glenosphere was then impacted onto the baseplate and the central locking screw was placed.  We returned attention back to the proximal humerus where the canal was opened and we broached up to a size 9 at approximate 20 degrees of retroversion.  A neutral metaphyseal reaming guide was then used to prepare the metaphysis.  A trial implant was placed and this showed excellent motion stability and soft tissue balance.  This point the trial was removed.  The canal was irrigated cleaned and dried.  The implant was assembled and vancomycin powder placed into the canal and the implant was then seated with excellent purchase and fixation.  We then performed a series of trial reductions and ultimately felt that +6 off the implant gives the best but soft tissue balance motion and stability.  Trial was then removed.  A +6 constrained poly was then utilized impacted onto the prosthesis and our final reduction was then performed which showed excellent motion stability and soft tissue balance.  We then evaluated the subscapularis and with mobilization we had hoped to preserve this but once it was repaired back it clearly restricted the shoulder external rotation and so we went ahead and release the subscapularis and did not perform a repair.  The wound was then copiously irrigated.  Final hemostasis was obtained.   The balance of vancomycin powder was then sprayed liberally throughout the deep soft tissue planes.  The deltopectoral interval was reapproximated with a series of figure-of-eight number Vicryl sutures.  2-0 Monocryl used to close the subcu layer and intracuticular 3-0 Monocryl used to close the skin followed by Dermabond and Aquacel dressing.  The right arm was placed into a sling and the patient was awakened, extubated, and taken to the recovery room in stable condition.  Senaida Lange MD   Contact # [phone]

## 2022-06-02 NOTE — Evaluation (Signed)
Occupational Therapy Evaluation Patient Details Name: Jeanette Rodriguez MRN: [id number] DOB: 1955-03-21 Today's Date: 06/02/2022   History of Present Illness 67 yo F s/p TSA.  PMH includes: B TKA, arthritis.   Clinical Impression   Patient admitted for the procedure above.  PTA she lives at home with her spouse, and needed no assist with ADL, iADL or mobility.  Currently she is needing Mod A for ADL completion due to block in place.  All education completed as documented and patient with good understanding.  She will have the needed assist at home via her spouse, and should progress well.  Recommend follow up as prescribed by MD.        Recommendations for follow up therapy are one component of a multi-disciplinary discharge planning process, led by the attending physician.  Recommendations may be updated based on patient status, additional functional criteria and insurance authorization.   Assistance Recommended at Discharge Intermittent Supervision/Assistance  Patient can return home with the following Assist for transportation;Assistance with cooking/housework;A little help with bathing/dressing/bathroom    Functional Status Assessment  Patient has had a recent decline in their functional status and demonstrates the ability to make significant improvements in function in a reasonable and predictable amount of time.  Equipment Recommendations  None recommended by OT    Recommendations for Other Services       Precautions / Restrictions Precautions Precautions: Shoulder Type of Shoulder Precautions: Allowed knee slides and pendulums Shoulder Interventions: Shoulder sling/immobilizer Precaution Booklet Issued: Yes (comment) Precaution Comments: Verbalized understanding Required Braces or Orthoses: Sling Restrictions Weight Bearing Restrictions: Yes RUE Weight Bearing: Non weight bearing Other Position/Activity Restrictions: No pushing or pulling      Mobility Bed Mobility                     Transfers Overall transfer level: Needs assistance   Transfers: Sit to/from Stand, Bed to chair/wheelchair/BSC Sit to Stand: Supervision                  Balance Overall balance assessment: Mild deficits observed, not formally tested                                         ADL either performed or assessed with clinical judgement   ADL                   Upper Body Dressing : Moderate assistance;Sitting;Standing   Lower Body Dressing: Moderate assistance;Sit to/from stand   Toilet Transfer: Retail banker;Ambulation                   Vision Baseline Vision/History: 1 Wears glasses Patient Visual Report: No change from baseline       Perception     Praxis      Pertinent Vitals/Pain Pain Assessment Pain Assessment: No/denies pain     Hand Dominance Left   Extremity/Trunk Assessment Upper Extremity Assessment Upper Extremity Assessment: RUE deficits/detail RUE Deficits / Details: block in place with limited AROM RUE Sensation: decreased light touch RUE Coordination: decreased fine motor;decreased gross motor   Lower Extremity Assessment Lower Extremity Assessment: Overall WFL for tasks assessed   Cervical / Trunk Assessment Cervical / Trunk Assessment: Normal   Communication Communication Communication: No difficulties   Cognition Arousal/Alertness: Awake/alert Behavior During Therapy: WFL for tasks assessed/performed Overall Cognitive Status: Within Functional Limits for tasks assessed  General Comments   VSS on RA    Exercises     Shoulder Instructions Shoulder Instructions Pendulum exercises (written home exercise program): Supervision/safety ROM for elbow, wrist and digits of operated UE: Supervision/safety Sling wearing schedule (on at all times/off for ADL's): Supervision/safety Positioning of UE while sleeping:  Set-up    Home Living Family/patient expects to be discharged to:: Private residence Living Arrangements: Spouse/significant other Available Help at Discharge: Family Type of Home: House Home Access: Stairs to enter Secretary/administrator of Steps: 3 Entrance Stairs-Rails: Right Home Layout: One level     Bathroom Shower/Tub: Producer, television/film/video: Standard Bathroom Accessibility: Yes How Accessible: Accessible via walker Home Equipment: Agricultural consultant (2 wheels);Rollator (4 wheels);Other (comment);Adaptive equipment Adaptive Equipment: Sock aid        Prior Functioning/Environment Prior Level of Function : Independent/Modified Independent             Mobility Comments: amb with walker or cane or furniture ADLs Comments: Ind with ADL and iADL        OT Problem List: Decreased strength      OT Treatment/Interventions:      OT Goals(Current goals can be found in the care plan section) Acute Rehab OT Goals Patient Stated Goal: Return home OT Goal Formulation: With patient Time For Goal Achievement: 06/06/22 Potential to Achieve Goals: Good  OT Frequency:      Co-evaluation              AM-PAC OT "6 Clicks" Daily Activity     Outcome Measure Help from another person eating meals?: None Help from another person taking care of personal grooming?: None Help from another person toileting, which includes using toliet, bedpan, or urinal?: A Little Help from another person bathing (including washing, rinsing, drying)?: A Lot Help from another person to put on and taking off regular upper body clothing?: A Lot Help from another person to put on and taking off regular lower body clothing?: A Lot 6 Click Score: 17   End of Session Nurse Communication: Mobility status  Activity Tolerance: Patient tolerated treatment well Patient left: in chair;with call bell/phone within reach  OT Visit Diagnosis: Muscle weakness (generalized) (M62.81)                 Time: 6073-7106 OT Time Calculation (min): 26 min Charges:  OT General Charges $OT Visit: 1 Visit OT Evaluation $OT Eval Moderate Complexity: 1 Mod OT Treatments $Self Care/Home Management : 8-22 mins   06/02/2022  RP, OTR/L  Acute Rehabilitation Services  Office:  [phone]   Suzanna Obey 06/02/2022, 5:07 PM

## 2022-06-02 NOTE — Discharge Instructions (Signed)
? ?Jeanette Rodriguez, M.D., F.A.A.O.S. ?Orthopaedic Surgery ?Specializing in Arthroscopic and Reconstructive ?Surgery of the Shoulder ?[phone] ?[address] - [place], [place] [address] - Fax [phone] ? ? ?POST-OP TOTAL SHOULDER REPLACEMENT INSTRUCTIONS ? ?1. Follow up in the office for your first post-op appointment 10-14 days from the date of your surgery. If you do not already have a scheduled appointment, our office will contact you to schedule. ? ?2. The bandage over your incision is waterproof. You may begin showering with this dressing on. You may leave this dressing on until first follow up appointment within 2 weeks. We prefer you leave this dressing in place until follow up however after 5-7 days if you are having itching or skin irritation and would like to remove it you may do so. Go slow and tug at the borders gently to break the bond the dressing has with the skin. At this point if there is no drainage it is okay to go without a bandage or you may cover it with a light guaze and tape. You can also expect significant bruising around your shoulder that will drift down your arm and into your chest wall. This is very normal and should resolve over several days. ? ? 3. Wear your sling/immobilizer at all times except to perform the exercises below or to occasionally let your arm dangle by your side to stretch your elbow. You also need to sleep in your sling immobilizer until instructed otherwise. It is ok to remove your sling if you are sitting in a controlled environment and allow your arm to rest in a position of comfort by your side or on your lap with pillows to give your neck and skin a break from the sling. You may remove it to allow arm to dangle by side to shower. If you are up walking around and when you go to sleep at night you need to wear it. ? ?4. Range of motion to your elbow, wrist, and hand are encouraged 3-5 times daily. Exercise to your hand and fingers helps to reduce  swelling you may experience. ? ? ?5. Prescriptions for a pain medication and a muscle relaxant are provided for you. It is recommended that if you are experiencing pain that you pain medication alone is not controlling, add the muscle relaxant along with the pain medication which can give additional pain relief. The first 1-2 days is generally the most severe of your pain and then should gradually decrease. As your pain lessens it is recommended that you decrease your use of the pain medications to an "as needed basis'" only and to always comply with the recommended dosages of the pain medications. ? ?6. Pain medications can produce constipation along with their use. If you experience this, the use of an over the counter stool softener or laxative daily is recommended.  ? ?7. For additional questions or concerns, please do not hesitate to call the office. If after hours there is an answering service to forward your concerns to the physician on call. ? ?8.Pain control following an exparel block ? ?To help control your post-operative pain you received a nerve block  performed with Exparel which is a long acting anesthetic (numbing agent) which can provide pain relief and sensations of numbness (and relief of pain) in the operative shoulder and arm for up to 3 days. Sometimes it provides mixed relief, meaning you may still have numbness in certain areas of the arm but can still be able to   move  parts of that arm, hand, and fingers. We recommend that your prescribed pain medications  be used as needed. We do not feel it is necessary to "pre medicate" and "stay ahead" of pain.  Taking narcotic pain medications when you are not having any pain can lead to unnecessary and potentially dangerous side effects.   ? ?9. Use the ice machine as much as possible in the first 5-7 days from surgery, then you can wean its use to as needed. The ice typically needs to be replaced every 6 hours, instead of ice you can actually freeze  water bottles to put in the cooler and then fill water around them to avoid having to purchase ice. You can have spare water bottles freezing to allow you to rotate them once they have melted. Try to have a thin shirt or light cloth or towel under the ice wrap to protect your skin.  ? ?FOR ADDITIONAL INFO ON ICE MACHINE AND INSTRUCTIONS GO TO THE WEBSITE AT ? ?https://www.[employer]ogl[employer].com/products/donjoy/donjoy-iceman-classic3 ? ?10.  We recommend that you avoid any dental work or cleaning in the first 3 months following your joint replacement. This is to help minimize the possibility of infection from the bacteria in your mouth that enters your bloodstream during dental work. We also recommend that you take an antibiotic prior to your dental work for the first year after your shoulder replacement to further help reduce that risk. Please simply contact our office for antibiotics to be sent to your pharmacy prior to dental work. ? ?11. Dental Antibiotics: ? ?We recommend waiting at least 3 months for any dental work even cleanings unless there is a dental emergency. We also recommend  prophylactic antibiotics for all dental procdeures  the first year following your joint replacement. In some exceptions we recommend them to be used lifelong. We will provide you with that prescription in follow up office visits, or you can call our office. ? ?Exceptions are as follows: ? ?1. History of prior total joint infection ? ?2. Severely immunocompromised (Organ Transplant, cancer chemotherapy, Rheumatoid biologic ?meds such as Humera) ? ?3. Poorly controlled diabetes (A1C &gt; 8.0, blood glucose over 200) ? ? ?POST-OP EXERCISES ? ?Pendulum Exercises ? ?Perform pendulum exercises while standing and bending at the waist. Support your uninvolved arm on a table or chair and allow your operated arm to hang freely. Make sure to do these exercises passively - not using you shoulder muscles. These exercises can be performed once your  nerve block effects have worn off. ? ?Repeat 20 times. Do 3 sessions per day. ? ? ?  ?

## 2022-06-02 NOTE — Anesthesia Procedure Notes (Addendum)
Procedure Name: Intubation Date/Time: 06/02/2022 11:53 AM  Performed by: Johnette Abraham, CRNAPre-anesthesia Checklist: Patient identified, Emergency Drugs available, Suction available and Patient being monitored Patient Re-evaluated:Patient Re-evaluated prior to induction Oxygen Delivery Method: Circle System Utilized Preoxygenation: Pre-oxygenation with 100% oxygen Induction Type: IV induction Ventilation: Mask ventilation without difficulty Laryngoscope Size: Mac and 3 Grade View: Grade I Tube type: Oral Tube size: 7.0 mm Number of attempts: 1 Airway Equipment and Method: Stylet and Oral airway Placement Confirmation: ETT inserted through vocal cords under direct vision, positive ETCO2 and breath sounds checked- equal and bilateral Secured at: 22 cm Tube secured with: Tape Dental Injury: Teeth and Oropharynx as per pre-operative assessment

## 2022-06-02 NOTE — H&P (Signed)
[place] [facility]    Chief Complaint: Right shoulder rotator cuff tear arthropathy HPI: The patient is a 67 y.o. female with chronic and progressively increasing right shoulder pain related to severe rotator cuff tear arthropathy.  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  Social History:  reports that she has quit smoking. Her smoking use included cigarettes. She has never used smokeless tobacco. She reports that she does not drink alcohol and does not use drugs.  BMI: Estimated body mass index is 28.9 kg/m as calculated from the following:   Height as of 05/16/22: 5\' 2"  (1.575 m).   Weight as of 05/16/22: 71.7 kg.  Lab Results  Component Value Date   ALBUMIN 4.4 07/27/2020   Diabetes: Patient does not have a diagnosis of diabetes.     Smoking Status:       No medications prior to admission.     Physical Exam: Right shoulder demonstrates painful and guarded motion is noted at recent office visits.  Globally decreased strength to manual muscle  testing.  Otherwise neurovascular intact in the right upper extremity.  Imaging studies confirmed changes consistent with chronic rotator cuff tear arthropathy.  Vitals     Assessment/Plan  Impression: Right shoulder rotator cuff tear arthropathy  Plan of Action: Procedure(s): REVERSE SHOULDER ARTHROPLASTY  Alejandra Barna M Talana Slatten 06/02/2022, 5:37 AM Contact # [phone]

## 2022-06-02 NOTE — Anesthesia Preprocedure Evaluation (Addendum)
Anesthesia Evaluation  Patient identified by MRN, date of birth, ID band Patient awake    Reviewed: Allergy & Precautions, NPO status , Patient's Chart, lab work & pertinent test results  Airway Mallampati: II  TM Distance: >3 FB Neck ROM: Full    Dental no notable dental hx. (+) Teeth Intact, Dental Advisory Given   Pulmonary asthma , former smoker   Pulmonary exam normal breath sounds clear to auscultation       Cardiovascular hypertension, Pt. on medications Normal cardiovascular exam Rhythm:Regular Rate:Normal     Neuro/Psych  PSYCHIATRIC DISORDERS Anxiety Depression    negative neurological ROS     GI/Hepatic Neg liver ROS,GERD  Controlled,,  Endo/Other  negative endocrine ROS    Renal/GU negative Renal ROS  negative genitourinary   Musculoskeletal  (+) Arthritis , Osteoarthritis,  Fibromyalgia -  Abdominal   Peds  Hematology negative hematology ROS (+)   Anesthesia Other Findings Ozempic LD 10d ago  Reproductive/Obstetrics negative OB ROS                             Anesthesia Physical Anesthesia Plan  ASA: 2  Anesthesia Plan: General and Regional   Post-op Pain Management: Regional block* and Tylenol PO (pre-op)*   Induction: Intravenous  PONV Risk Score and Plan: 3 and Ondansetron, Dexamethasone, Midazolam and Treatment may vary due to age or medical condition  Airway Management Planned: Oral ETT  Additional Equipment: None  Intra-op Plan:   Post-operative Plan: Extubation in OR  Informed Consent: I have reviewed the patients History and Physical, chart, labs and discussed the procedure including the risks, benefits and alternatives for the proposed anesthesia with the patient or authorized representative who has indicated his/her understanding and acceptance.     Dental advisory given  Plan Discussed with: CRNA  Anesthesia Plan Comments:        Anesthesia  Quick Evaluation

## 2022-06-02 NOTE — Anesthesia Procedure Notes (Deleted)
Procedure Name: Intubation Date/Time: 06/02/2022 11:53 AM  Performed by: Johnette Abraham, CRNAPre-anesthesia Checklist: Patient identified, Emergency Drugs available, Suction available and Patient being monitored Patient Re-evaluated:Patient Re-evaluated prior to induction Oxygen Delivery Method: Circle system utilized Preoxygenation: Pre-oxygenation with 100% oxygen Induction Type: IV induction Ventilation: Mask ventilation without difficulty Laryngoscope Size: Mac and 3 Grade View: Grade II Tube type: Oral Tube size: 7.0 mm Number of attempts: 1 Airway Equipment and Method: Stylet and Oral airway Placement Confirmation: ETT inserted through vocal cords under direct vision, positive ETCO2 and breath sounds checked- equal and bilateral Secured at: 21 cm Tube secured with: Tape Dental Injury: Teeth and Oropharynx as per pre-operative assessment

## 2022-06-02 NOTE — Anesthesia Postprocedure Evaluation (Signed)
Anesthesia Post Note  Patient: Jeanette Rodriguez  Procedure(s) Performed: REVERSE SHOULDER ARTHROPLASTY (Right: Shoulder)     Patient location during evaluation: PACU Anesthesia Type: Regional and General Level of consciousness: awake and alert, oriented and patient cooperative Pain management: pain level controlled Vital Signs Assessment: post-procedure vital signs reviewed and stable Respiratory status: spontaneous breathing, nonlabored ventilation and respiratory function stable Cardiovascular status: blood pressure returned to baseline and stable Postop Assessment: no apparent nausea or vomiting Anesthetic complications: no   No notable events documented.  Last Vitals:  Vitals:   06/02/22 1415 06/02/22 1430  BP: 126/77 108/63  Pulse: (!) 102 (!) 55  Resp: 20 15  Temp:    SpO2: 100% 91%    Last Pain:  Vitals:   06/02/22 1430  TempSrc:   PainSc: 0-No pain                 Lannie Fields

## 2022-06-02 NOTE — Care Plan (Signed)
Ortho Bundle Case Management Note  Patient Details  Name: Jeanette Rodriguez MRN: [id number] Date of Birth: [birth year]                  R Rev TSA on 06/02/22.  DCP: Home with wife.  DME: Sling and ice machine provided by hospital.  PT: EO   DME Arranged:  N/A DME Agency:       Additional Comments: Please contact me with any questions of if this plan should need to change.    Despina Pole, Case Manager  [facility]  [phone] 06/02/2022, 12:19 PM

## 2022-06-02 NOTE — Transfer of Care (Signed)
Immediate Anesthesia Transfer of Care Note  Patient: Jeanette Rodriguez  Procedure(s) Performed: REVERSE SHOULDER ARTHROPLASTY (Right: Shoulder)  Patient Location: PACU  Anesthesia Type:General  Level of Consciousness: awake, alert , and patient cooperative  Airway & Oxygen Therapy: Patient Spontanous Breathing and Patient connected to face mask oxygen  Post-op Assessment: Report given to RN and Post -op Vital signs reviewed and stable  Post vital signs: Reviewed and stable  Last Vitals:  Vitals Value Taken Time  BP 117/65 06/02/22 1348  Temp    Pulse 84 06/02/22 1350  Resp 18 06/02/22 1350  SpO2 100 % 06/02/22 1350  Vitals shown include unvalidated device data.  Last Pain:  Vitals:   06/02/22 1110  TempSrc:   PainSc: 0-No pain      Patients Stated Pain Goal: 3 (06/02/22 1026)  Complications: No notable events documented.

## 2022-06-02 NOTE — Anesthesia Procedure Notes (Signed)
Anesthesia Regional Block: Interscalene brachial plexus block   Pre-Anesthetic Checklist: , timeout performed,  Correct Patient, Correct Site, Correct Laterality,  Correct Procedure, Correct Position, site marked,  Risks and benefits discussed,  Surgical consent,  Pre-op evaluation,  At surgeon's request and post-op pain management  Laterality: Right  Prep: Maximum Sterile Barrier Precautions used, chloraprep       Needles:  Injection technique: Single-shot  Needle Type: Echogenic Stimulator Needle     Needle Length: 9cm  Needle Gauge: 22     Additional Needles:   Procedures:,,,, ultrasound used (permanent image in chart),,    Narrative:  Start time: 06/02/2022 11:10 AM End time: 06/02/2022 11:15 AM Injection made incrementally with aspirations every 5 mL.  Performed by: Personally  Anesthesiologist: Lannie Fields, DO  Additional Notes: Monitors applied. No increased pain on injection. No increased resistance to injection. Injection made in 5cc increments. Good needle visualization. Patient tolerated procedure well.

## 2022-06-03 ENCOUNTER — Other Ambulatory Visit (HOSPITAL_BASED_OUTPATIENT_CLINIC_OR_DEPARTMENT_OTHER): Payer: Self-pay

## 2022-06-06 ENCOUNTER — Other Ambulatory Visit (HOSPITAL_BASED_OUTPATIENT_CLINIC_OR_DEPARTMENT_OTHER): Payer: Self-pay

## 2022-06-08 ENCOUNTER — Encounter (HOSPITAL_COMMUNITY): Payer: Self-pay | Admitting: Orthopedic Surgery

## 2022-06-20 ENCOUNTER — Other Ambulatory Visit (HOSPITAL_BASED_OUTPATIENT_CLINIC_OR_DEPARTMENT_OTHER): Payer: Self-pay

## 2022-06-20 MED ORDER — FUROSEMIDE 20 MG PO TABS
20.0000 mg | ORAL_TABLET | Freq: Every day | ORAL | 1 refills | Status: DC | PRN
  Filled 2022-06-20: qty 30, 30d supply, fill #0

## 2022-06-23 ENCOUNTER — Other Ambulatory Visit (HOSPITAL_BASED_OUTPATIENT_CLINIC_OR_DEPARTMENT_OTHER): Payer: Self-pay

## 2022-06-23 MED ORDER — OXYCODONE-ACETAMINOPHEN 10-325 MG PO TABS
1.0000 | ORAL_TABLET | Freq: Four times a day (QID) | ORAL | 0 refills | Status: DC | PRN
  Filled 2022-06-23: qty 120, 30d supply, fill #0

## 2022-07-03 IMAGING — DX DG FINGER INDEX 2+V*R*
3 series · 3 of 3 positions shown · non-contrast
Comparison: None.

CLINICAL DATA: Posterior right hand cut at the second MCP on the
lateral side with an electrical chain saw.

EXAM:
RIGHT INDEX FINGER 2+V

[finger ap]
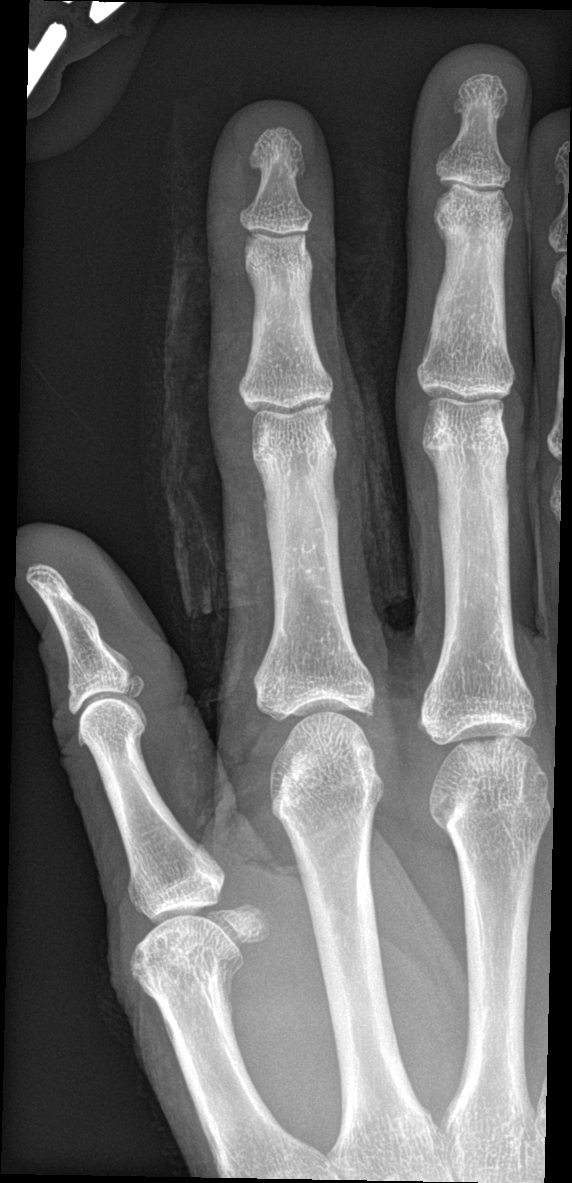

[finger obl]
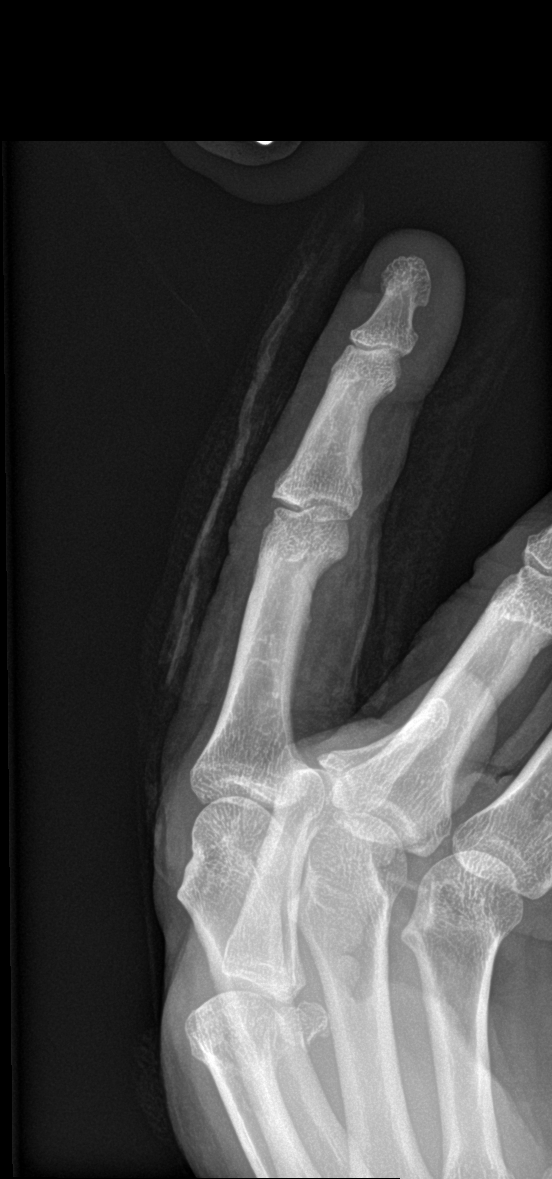

[finger lat]
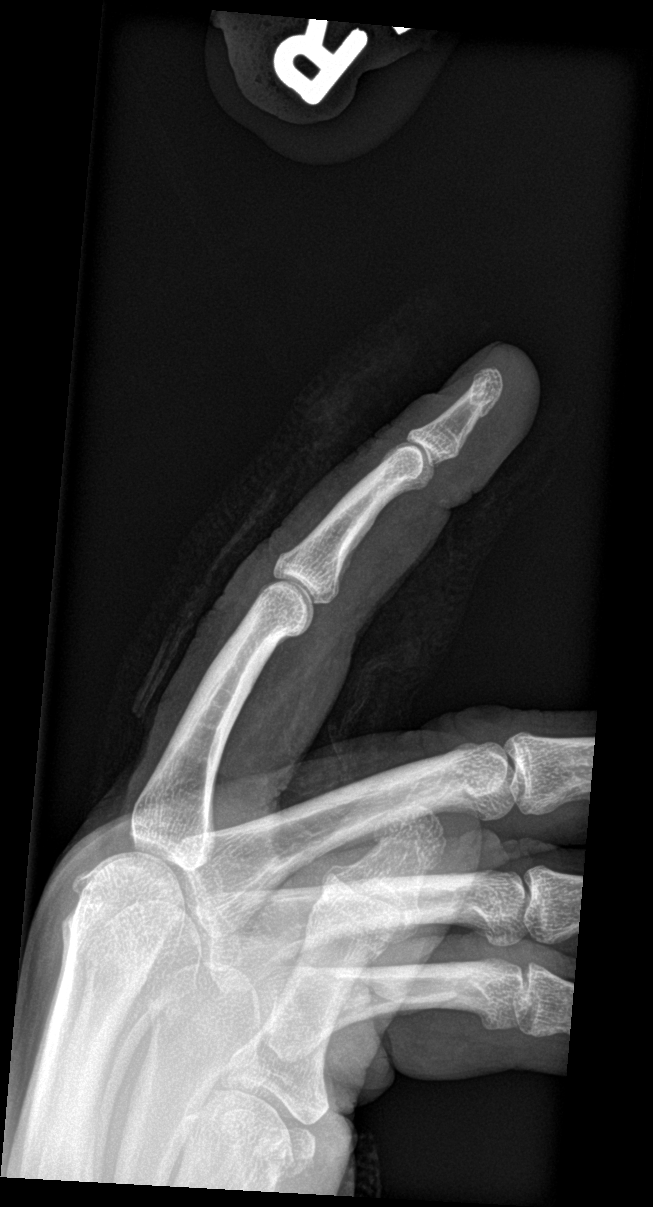

[3 of 3 positions shown; findings below may reference images not displayed]

FINDINGS: There is no evidence of fracture or dislocation. Radiopaque foreign
body. Soft tissue swelling about the index finger.
IMPRESSION: No evidence of fracture or dislocation.  No radiopaque foreign body.

## 2022-07-05 ENCOUNTER — Other Ambulatory Visit (HOSPITAL_BASED_OUTPATIENT_CLINIC_OR_DEPARTMENT_OTHER): Payer: Self-pay

## 2022-07-19 ENCOUNTER — Other Ambulatory Visit (HOSPITAL_BASED_OUTPATIENT_CLINIC_OR_DEPARTMENT_OTHER): Payer: Self-pay

## 2022-07-19 MED ORDER — OXYCODONE-ACETAMINOPHEN 10-325 MG PO TABS
1.0000 | ORAL_TABLET | Freq: Four times a day (QID) | ORAL | 0 refills | Status: DC | PRN
  Filled 2022-07-21: qty 120, 30d supply, fill #0

## 2022-07-20 ENCOUNTER — Other Ambulatory Visit (HOSPITAL_BASED_OUTPATIENT_CLINIC_OR_DEPARTMENT_OTHER): Payer: Self-pay

## 2022-07-21 ENCOUNTER — Other Ambulatory Visit: Payer: Self-pay

## 2022-07-21 ENCOUNTER — Other Ambulatory Visit (HOSPITAL_BASED_OUTPATIENT_CLINIC_OR_DEPARTMENT_OTHER): Payer: Self-pay

## 2022-07-22 ENCOUNTER — Other Ambulatory Visit (HOSPITAL_BASED_OUTPATIENT_CLINIC_OR_DEPARTMENT_OTHER): Payer: Self-pay

## 2022-08-09 ENCOUNTER — Other Ambulatory Visit (HOSPITAL_BASED_OUTPATIENT_CLINIC_OR_DEPARTMENT_OTHER): Payer: Self-pay

## 2022-08-11 ENCOUNTER — Other Ambulatory Visit (HOSPITAL_BASED_OUTPATIENT_CLINIC_OR_DEPARTMENT_OTHER): Payer: Self-pay

## 2022-08-11 MED ORDER — AMOXICILLIN 500 MG PO CAPS
2000.0000 mg | ORAL_CAPSULE | ORAL | 0 refills | Status: DC
  Filled 2022-08-11: qty 20, 5d supply, fill #0

## 2022-08-13 ENCOUNTER — Other Ambulatory Visit (HOSPITAL_BASED_OUTPATIENT_CLINIC_OR_DEPARTMENT_OTHER): Payer: Self-pay

## 2022-08-22 ENCOUNTER — Other Ambulatory Visit (HOSPITAL_BASED_OUTPATIENT_CLINIC_OR_DEPARTMENT_OTHER): Payer: Self-pay

## 2022-08-22 MED ORDER — OXYCODONE-ACETAMINOPHEN 10-325 MG PO TABS
1.0000 | ORAL_TABLET | Freq: Four times a day (QID) | ORAL | 0 refills | Status: DC
  Filled 2022-08-22: qty 30, 8d supply, fill #0
  Filled 2022-08-30: qty 90, 23d supply, fill #1

## 2022-08-23 ENCOUNTER — Other Ambulatory Visit (HOSPITAL_BASED_OUTPATIENT_CLINIC_OR_DEPARTMENT_OTHER): Payer: Self-pay

## 2022-08-23 MED ORDER — PREDNISONE 50 MG PO TABS
50.0000 mg | ORAL_TABLET | Freq: Every day | ORAL | 0 refills | Status: AC
  Filled 2022-08-23: qty 5, 5d supply, fill #0

## 2022-08-30 ENCOUNTER — Other Ambulatory Visit (HOSPITAL_BASED_OUTPATIENT_CLINIC_OR_DEPARTMENT_OTHER): Payer: Self-pay

## 2022-08-30 MED ORDER — PREDNISONE 50 MG PO TABS
50.0000 mg | ORAL_TABLET | Freq: Every day | ORAL | 0 refills | Status: AC
  Filled 2022-08-30: qty 5, 5d supply, fill #0

## 2022-08-31 ENCOUNTER — Other Ambulatory Visit (HOSPITAL_BASED_OUTPATIENT_CLINIC_OR_DEPARTMENT_OTHER): Payer: Self-pay

## 2022-08-31 MED ORDER — OXYCODONE-ACETAMINOPHEN 10-325 MG PO TABS
1.0000 | ORAL_TABLET | Freq: Four times a day (QID) | ORAL | 0 refills | Status: DC
  Filled 2022-08-31: qty 120, 30d supply, fill #0
  Filled 2022-08-31: qty 28, 7d supply, fill #0
  Filled 2022-09-05: qty 28, 7d supply, fill #1
  Filled ????-??-??: fill #0

## 2022-09-05 ENCOUNTER — Other Ambulatory Visit: Payer: Self-pay

## 2022-09-12 ENCOUNTER — Other Ambulatory Visit (HOSPITAL_BASED_OUTPATIENT_CLINIC_OR_DEPARTMENT_OTHER): Payer: Self-pay

## 2022-09-12 MED ORDER — DIAZEPAM 10 MG PO TABS
10.0000 mg | ORAL_TABLET | Freq: Once | ORAL | 0 refills | Status: DC | PRN
  Filled 2022-09-12: qty 2, 1d supply, fill #0

## 2022-09-28 ENCOUNTER — Other Ambulatory Visit (HOSPITAL_BASED_OUTPATIENT_CLINIC_OR_DEPARTMENT_OTHER): Payer: Self-pay

## 2022-10-10 ENCOUNTER — Other Ambulatory Visit (HOSPITAL_BASED_OUTPATIENT_CLINIC_OR_DEPARTMENT_OTHER): Payer: Self-pay

## 2022-10-10 MED ORDER — HYDROCHLOROTHIAZIDE 12.5 MG PO TABS
12.5000 mg | ORAL_TABLET | Freq: Every morning | ORAL | 3 refills | Status: DC
  Filled 2022-10-10: qty 90, 90d supply, fill #0
  Filled 2022-12-19: qty 90, 90d supply, fill #1
  Filled 2023-04-04: qty 30, 30d supply, fill #2

## 2022-10-10 MED ORDER — DULOXETINE HCL 60 MG PO CPEP
60.0000 mg | ORAL_CAPSULE | Freq: Every day | ORAL | 0 refills | Status: DC
  Filled 2022-10-10: qty 90, 90d supply, fill #0

## 2022-10-10 MED ORDER — FLUTICASONE PROPIONATE 50 MCG/ACT NA SUSP
1.0000 | Freq: Every day | NASAL | 5 refills | Status: DC
  Filled 2022-10-10 – 2022-11-04 (×2): qty 16, 30d supply, fill #0
  Filled 2022-12-05: qty 16, 30d supply, fill #1
  Filled 2023-02-06: qty 16, 30d supply, fill #2
  Filled 2023-03-06: qty 16, 30d supply, fill #3

## 2022-10-10 MED ORDER — DULOXETINE HCL 30 MG PO CPEP
30.0000 mg | ORAL_CAPSULE | Freq: Every day | ORAL | 0 refills | Status: DC
  Filled 2022-10-10: qty 7, 7d supply, fill #0

## 2022-10-11 ENCOUNTER — Other Ambulatory Visit: Payer: Self-pay

## 2022-10-11 ENCOUNTER — Other Ambulatory Visit (HOSPITAL_BASED_OUTPATIENT_CLINIC_OR_DEPARTMENT_OTHER): Payer: Self-pay

## 2022-10-11 MED ORDER — MONTELUKAST SODIUM 10 MG PO TABS
10.0000 mg | ORAL_TABLET | Freq: Every day | ORAL | 3 refills | Status: AC
  Filled 2022-10-11 – 2022-12-05 (×2): qty 90, 90d supply, fill #0
  Filled 2023-03-06: qty 90, 90d supply, fill #1

## 2022-10-11 MED ORDER — OXYCODONE-ACETAMINOPHEN 10-325 MG PO TABS
1.0000 | ORAL_TABLET | Freq: Four times a day (QID) | ORAL | 0 refills | Status: DC | PRN
  Filled 2022-10-11: qty 120, 30d supply, fill #0

## 2022-10-11 MED ORDER — WEGOVY 2.4 MG/0.75ML ~~LOC~~ SOAJ
2.4000 mg | SUBCUTANEOUS | 3 refills | Status: DC
  Filled 2022-10-11 – 2022-11-21 (×2): qty 3, 28d supply, fill #0
  Filled 2022-12-14: qty 3, 28d supply, fill #1
  Filled 2023-01-09: qty 3, 28d supply, fill #2
  Filled 2023-01-30 – 2023-02-06 (×2): qty 3, 28d supply, fill #3
  Filled 2023-02-28: qty 3, 28d supply, fill #4
  Filled 2023-03-27: qty 3, 28d supply, fill #5
  Filled 2023-04-17: qty 3, 28d supply, fill #6
  Filled 2023-05-09: qty 3, 28d supply, fill #7
  Filled 2023-06-06: qty 3, 28d supply, fill #8
  Filled 2023-06-24 – 2023-06-28 (×2): qty 3, 28d supply, fill #9
  Filled 2023-07-27: qty 3, 28d supply, fill #10
  Filled 2023-08-24: qty 3, 28d supply, fill #11

## 2022-10-11 MED ORDER — CETIRIZINE HCL 10 MG PO TABS
10.0000 mg | ORAL_TABLET | Freq: Every day | ORAL | 3 refills | Status: DC
  Filled 2022-10-11: qty 90, 90d supply, fill #0
  Filled 2023-02-02: qty 90, 90d supply, fill #1

## 2022-10-11 MED ORDER — GABAPENTIN 300 MG PO CAPS
300.0000 mg | ORAL_CAPSULE | Freq: Three times a day (TID) | ORAL | 1 refills | Status: DC
  Filled 2022-10-11: qty 270, 90d supply, fill #0

## 2022-10-11 MED ORDER — METHOCARBAMOL 500 MG PO TABS
500.0000 mg | ORAL_TABLET | Freq: Three times a day (TID) | ORAL | 1 refills | Status: DC | PRN
  Filled 2022-10-11: qty 30, 10d supply, fill #0

## 2022-10-12 ENCOUNTER — Other Ambulatory Visit (HOSPITAL_BASED_OUTPATIENT_CLINIC_OR_DEPARTMENT_OTHER): Payer: Self-pay

## 2022-10-22 ENCOUNTER — Other Ambulatory Visit (HOSPITAL_BASED_OUTPATIENT_CLINIC_OR_DEPARTMENT_OTHER): Payer: Self-pay

## 2022-10-22 MED ORDER — DIAZEPAM 10 MG PO TABS
10.0000 mg | ORAL_TABLET | ORAL | 0 refills | Status: DC
  Filled 2022-10-22: qty 4, 2d supply, fill #0

## 2022-10-28 ENCOUNTER — Other Ambulatory Visit (HOSPITAL_BASED_OUTPATIENT_CLINIC_OR_DEPARTMENT_OTHER): Payer: Self-pay

## 2022-11-03 ENCOUNTER — Other Ambulatory Visit (HOSPITAL_BASED_OUTPATIENT_CLINIC_OR_DEPARTMENT_OTHER): Payer: Self-pay

## 2022-11-03 MED ORDER — FLUAD 0.5 ML IM SUSY
0.5000 mL | PREFILLED_SYRINGE | Freq: Once | INTRAMUSCULAR | 0 refills | Status: AC
  Filled 2022-11-03: qty 0.5, 1d supply, fill #0

## 2022-11-03 MED ORDER — COMIRNATY 30 MCG/0.3ML IM SUSY
0.3000 mL | PREFILLED_SYRINGE | Freq: Once | INTRAMUSCULAR | 0 refills | Status: AC
  Filled 2022-11-03: qty 0.3, 1d supply, fill #0

## 2022-11-04 ENCOUNTER — Other Ambulatory Visit: Payer: Self-pay

## 2022-11-04 ENCOUNTER — Other Ambulatory Visit (HOSPITAL_BASED_OUTPATIENT_CLINIC_OR_DEPARTMENT_OTHER): Payer: Self-pay

## 2022-11-04 ENCOUNTER — Other Ambulatory Visit (HOSPITAL_COMMUNITY): Payer: Self-pay

## 2022-11-19 ENCOUNTER — Other Ambulatory Visit (HOSPITAL_BASED_OUTPATIENT_CLINIC_OR_DEPARTMENT_OTHER): Payer: Self-pay

## 2022-11-19 MED ORDER — OXYCODONE-ACETAMINOPHEN 10-325 MG PO TABS
1.0000 | ORAL_TABLET | Freq: Four times a day (QID) | ORAL | 0 refills | Status: DC | PRN
  Filled 2022-11-19: qty 120, 30d supply, fill #0

## 2022-11-21 ENCOUNTER — Other Ambulatory Visit (HOSPITAL_BASED_OUTPATIENT_CLINIC_OR_DEPARTMENT_OTHER): Payer: Self-pay

## 2022-12-05 ENCOUNTER — Other Ambulatory Visit (HOSPITAL_BASED_OUTPATIENT_CLINIC_OR_DEPARTMENT_OTHER): Payer: Self-pay

## 2022-12-12 ENCOUNTER — Other Ambulatory Visit (HOSPITAL_BASED_OUTPATIENT_CLINIC_OR_DEPARTMENT_OTHER): Payer: Self-pay

## 2022-12-14 ENCOUNTER — Other Ambulatory Visit (HOSPITAL_BASED_OUTPATIENT_CLINIC_OR_DEPARTMENT_OTHER): Payer: Self-pay

## 2022-12-19 ENCOUNTER — Other Ambulatory Visit: Payer: Self-pay

## 2022-12-19 ENCOUNTER — Other Ambulatory Visit (HOSPITAL_BASED_OUTPATIENT_CLINIC_OR_DEPARTMENT_OTHER): Payer: Self-pay

## 2022-12-19 MED ORDER — OXYCODONE-ACETAMINOPHEN 10-325 MG PO TABS
1.0000 | ORAL_TABLET | Freq: Four times a day (QID) | ORAL | 0 refills | Status: DC | PRN
  Filled 2022-12-19 – 2023-05-15 (×3): qty 120, 30d supply, fill #0

## 2022-12-20 ENCOUNTER — Other Ambulatory Visit (HOSPITAL_BASED_OUTPATIENT_CLINIC_OR_DEPARTMENT_OTHER): Payer: Self-pay

## 2022-12-20 MED ORDER — DULOXETINE HCL 60 MG PO CPEP
60.0000 mg | ORAL_CAPSULE | Freq: Every day | ORAL | 0 refills | Status: DC
  Filled 2022-12-20: qty 90, 90d supply, fill #0

## 2022-12-29 ENCOUNTER — Other Ambulatory Visit (HOSPITAL_BASED_OUTPATIENT_CLINIC_OR_DEPARTMENT_OTHER): Payer: Self-pay

## 2022-12-29 MED ORDER — DICLOFENAC SODIUM 1 % EX GEL
CUTANEOUS | 3 refills | Status: AC
  Filled 2022-12-29: qty 100, 12d supply, fill #0

## 2023-01-03 ENCOUNTER — Other Ambulatory Visit (HOSPITAL_BASED_OUTPATIENT_CLINIC_OR_DEPARTMENT_OTHER): Payer: Self-pay

## 2023-01-04 ENCOUNTER — Other Ambulatory Visit (HOSPITAL_BASED_OUTPATIENT_CLINIC_OR_DEPARTMENT_OTHER): Payer: Self-pay

## 2023-01-12 ENCOUNTER — Other Ambulatory Visit: Payer: Self-pay

## 2023-01-12 ENCOUNTER — Other Ambulatory Visit (HOSPITAL_BASED_OUTPATIENT_CLINIC_OR_DEPARTMENT_OTHER): Payer: Self-pay

## 2023-01-12 MED ORDER — AMOXICILLIN-POT CLAVULANATE 875-125 MG PO TABS
1.0000 | ORAL_TABLET | Freq: Two times a day (BID) | ORAL | 0 refills | Status: AC
  Filled 2023-01-12: qty 14, 7d supply, fill #0

## 2023-01-12 MED ORDER — DULOXETINE HCL 60 MG PO CPEP
60.0000 mg | ORAL_CAPSULE | Freq: Every day | ORAL | 3 refills | Status: AC
  Filled 2023-01-12: qty 90, 90d supply, fill #0
  Filled 2023-04-05: qty 15, 15d supply, fill #0

## 2023-01-14 ENCOUNTER — Other Ambulatory Visit (HOSPITAL_BASED_OUTPATIENT_CLINIC_OR_DEPARTMENT_OTHER): Payer: Self-pay

## 2023-01-14 MED ORDER — OXYCODONE-ACETAMINOPHEN 10-325 MG PO TABS
1.0000 | ORAL_TABLET | Freq: Four times a day (QID) | ORAL | 0 refills | Status: DC | PRN
  Filled 2023-01-14: qty 120, 30d supply, fill #0

## 2023-01-30 ENCOUNTER — Other Ambulatory Visit (HOSPITAL_BASED_OUTPATIENT_CLINIC_OR_DEPARTMENT_OTHER): Payer: Self-pay

## 2023-02-01 ENCOUNTER — Other Ambulatory Visit (HOSPITAL_BASED_OUTPATIENT_CLINIC_OR_DEPARTMENT_OTHER): Payer: Self-pay

## 2023-02-02 ENCOUNTER — Other Ambulatory Visit (HOSPITAL_BASED_OUTPATIENT_CLINIC_OR_DEPARTMENT_OTHER): Payer: Self-pay

## 2023-02-06 ENCOUNTER — Other Ambulatory Visit (HOSPITAL_BASED_OUTPATIENT_CLINIC_OR_DEPARTMENT_OTHER): Payer: Self-pay

## 2023-02-06 MED ORDER — OXYCODONE-ACETAMINOPHEN 10-325 MG PO TABS
1.0000 | ORAL_TABLET | Freq: Four times a day (QID) | ORAL | 0 refills | Status: DC | PRN
  Filled 2023-02-11: qty 120, 30d supply, fill #0

## 2023-02-11 ENCOUNTER — Other Ambulatory Visit (HOSPITAL_BASED_OUTPATIENT_CLINIC_OR_DEPARTMENT_OTHER): Payer: Self-pay

## 2023-02-27 ENCOUNTER — Other Ambulatory Visit (HOSPITAL_BASED_OUTPATIENT_CLINIC_OR_DEPARTMENT_OTHER): Payer: Self-pay

## 2023-02-28 ENCOUNTER — Other Ambulatory Visit (HOSPITAL_BASED_OUTPATIENT_CLINIC_OR_DEPARTMENT_OTHER): Payer: Self-pay

## 2023-03-06 ENCOUNTER — Other Ambulatory Visit (HOSPITAL_BASED_OUTPATIENT_CLINIC_OR_DEPARTMENT_OTHER): Payer: Self-pay

## 2023-03-09 ENCOUNTER — Other Ambulatory Visit (HOSPITAL_BASED_OUTPATIENT_CLINIC_OR_DEPARTMENT_OTHER): Payer: Self-pay

## 2023-03-10 ENCOUNTER — Other Ambulatory Visit (HOSPITAL_BASED_OUTPATIENT_CLINIC_OR_DEPARTMENT_OTHER): Payer: Self-pay

## 2023-03-10 MED ORDER — CETIRIZINE HCL 10 MG PO TABS
10.0000 mg | ORAL_TABLET | Freq: Every day | ORAL | 3 refills | Status: AC
  Filled 2023-03-10 (×2): qty 90, 90d supply, fill #0
  Filled 2023-06-19: qty 90, 90d supply, fill #1
  Filled 2023-09-13: qty 90, 90d supply, fill #2
  Filled 2023-12-12: qty 90, 90d supply, fill #3

## 2023-03-14 ENCOUNTER — Other Ambulatory Visit (HOSPITAL_BASED_OUTPATIENT_CLINIC_OR_DEPARTMENT_OTHER): Payer: Self-pay

## 2023-03-14 MED ORDER — OXYCODONE-ACETAMINOPHEN 10-325 MG PO TABS
1.0000 | ORAL_TABLET | Freq: Four times a day (QID) | ORAL | 0 refills | Status: DC | PRN
  Filled 2023-03-14: qty 120, 30d supply, fill #0

## 2023-03-17 ENCOUNTER — Other Ambulatory Visit (HOSPITAL_BASED_OUTPATIENT_CLINIC_OR_DEPARTMENT_OTHER): Payer: Self-pay

## 2023-03-21 ENCOUNTER — Other Ambulatory Visit (HOSPITAL_BASED_OUTPATIENT_CLINIC_OR_DEPARTMENT_OTHER): Payer: Self-pay

## 2023-03-27 ENCOUNTER — Other Ambulatory Visit (HOSPITAL_BASED_OUTPATIENT_CLINIC_OR_DEPARTMENT_OTHER): Payer: Self-pay

## 2023-03-31 ENCOUNTER — Other Ambulatory Visit (HOSPITAL_BASED_OUTPATIENT_CLINIC_OR_DEPARTMENT_OTHER): Payer: Self-pay

## 2023-04-04 ENCOUNTER — Other Ambulatory Visit (HOSPITAL_BASED_OUTPATIENT_CLINIC_OR_DEPARTMENT_OTHER): Payer: Self-pay

## 2023-04-05 ENCOUNTER — Other Ambulatory Visit (HOSPITAL_BASED_OUTPATIENT_CLINIC_OR_DEPARTMENT_OTHER): Payer: Self-pay

## 2023-04-17 ENCOUNTER — Other Ambulatory Visit (HOSPITAL_BASED_OUTPATIENT_CLINIC_OR_DEPARTMENT_OTHER): Payer: Self-pay

## 2023-04-17 MED ORDER — OXYCODONE-ACETAMINOPHEN 10-325 MG PO TABS
1.0000 | ORAL_TABLET | Freq: Four times a day (QID) | ORAL | 0 refills | Status: DC | PRN
  Filled 2023-04-17: qty 120, 30d supply, fill #0

## 2023-05-08 ENCOUNTER — Other Ambulatory Visit (HOSPITAL_BASED_OUTPATIENT_CLINIC_OR_DEPARTMENT_OTHER): Payer: Self-pay

## 2023-05-08 MED ORDER — AMOXICILLIN 500 MG PO CAPS
2000.0000 mg | ORAL_CAPSULE | ORAL | 0 refills | Status: DC
  Filled 2023-05-08: qty 8, 2d supply, fill #0

## 2023-05-08 MED ORDER — OXYCODONE-ACETAMINOPHEN 10-325 MG PO TABS
1.0000 | ORAL_TABLET | Freq: Four times a day (QID) | ORAL | 0 refills | Status: DC | PRN
Start: 2023-05-08 — End: 2023-06-16
  Filled 2023-06-12: qty 120, 30d supply, fill #0

## 2023-05-09 ENCOUNTER — Other Ambulatory Visit (HOSPITAL_BASED_OUTPATIENT_CLINIC_OR_DEPARTMENT_OTHER): Payer: Self-pay

## 2023-05-09 ENCOUNTER — Other Ambulatory Visit: Payer: Self-pay | Admitting: Family Medicine

## 2023-05-09 ENCOUNTER — Other Ambulatory Visit (HOSPITAL_BASED_OUTPATIENT_CLINIC_OR_DEPARTMENT_OTHER): Payer: Self-pay | Admitting: Family Medicine

## 2023-05-09 DIAGNOSIS — R229 Localized swelling, mass and lump, unspecified: Secondary | ICD-10-CM

## 2023-05-09 DIAGNOSIS — E785 Hyperlipidemia, unspecified: Secondary | ICD-10-CM

## 2023-05-15 ENCOUNTER — Other Ambulatory Visit (HOSPITAL_BASED_OUTPATIENT_CLINIC_OR_DEPARTMENT_OTHER): Payer: Self-pay

## 2023-05-15 ENCOUNTER — Ambulatory Visit
Admission: RE | Admit: 2023-05-15 | Discharge: 2023-05-15 | Disposition: A | Discharge status: Home / Self Care | Source: Ambulatory Visit | Attending: Family Medicine | Admitting: Family Medicine

## 2023-05-15 DIAGNOSIS — R229 Localized swelling, mass and lump, unspecified: Secondary | ICD-10-CM

## 2023-05-16 ENCOUNTER — Ambulatory Visit (HOSPITAL_COMMUNITY)
Admission: RE | Admit: 2023-05-16 | Discharge: 2023-05-16 | Disposition: A | Discharge status: Home / Self Care | Payer: Self-pay | Source: Ambulatory Visit | Attending: Family Medicine | Admitting: Family Medicine

## 2023-05-16 DIAGNOSIS — E785 Hyperlipidemia, unspecified: Secondary | ICD-10-CM | POA: Insufficient documentation

## 2023-05-24 ENCOUNTER — Other Ambulatory Visit (HOSPITAL_BASED_OUTPATIENT_CLINIC_OR_DEPARTMENT_OTHER): Payer: Self-pay

## 2023-05-24 MED ORDER — DIAZEPAM 10 MG PO TABS
10.0000 mg | ORAL_TABLET | Freq: Every day | ORAL | 0 refills | Status: DC | PRN
  Filled 2023-05-24: qty 2, 2d supply, fill #0

## 2023-05-27 ENCOUNTER — Other Ambulatory Visit (HOSPITAL_BASED_OUTPATIENT_CLINIC_OR_DEPARTMENT_OTHER): Payer: Self-pay

## 2023-05-29 ENCOUNTER — Encounter: Payer: Self-pay | Admitting: Family Medicine

## 2023-06-06 ENCOUNTER — Other Ambulatory Visit (HOSPITAL_BASED_OUTPATIENT_CLINIC_OR_DEPARTMENT_OTHER): Payer: Self-pay

## 2023-06-12 ENCOUNTER — Other Ambulatory Visit: Payer: Self-pay

## 2023-06-12 ENCOUNTER — Other Ambulatory Visit (HOSPITAL_BASED_OUTPATIENT_CLINIC_OR_DEPARTMENT_OTHER): Payer: Self-pay

## 2023-06-12 MED ORDER — OXYCODONE-ACETAMINOPHEN 10-325 MG PO TABS: 1.0000 | ORAL_TABLET | Freq: Four times a day (QID) | ORAL | 0 refills | Status: DC | PRN

## 2023-06-13 ENCOUNTER — Other Ambulatory Visit (HOSPITAL_BASED_OUTPATIENT_CLINIC_OR_DEPARTMENT_OTHER): Payer: Self-pay

## 2023-06-13 MED ORDER — AMOXICILLIN-POT CLAVULANATE 875-125 MG PO TABS
1.0000 | ORAL_TABLET | Freq: Two times a day (BID) | ORAL | 0 refills | Status: DC
  Filled 2023-06-13: qty 20, 10d supply, fill #0

## 2023-06-16 ENCOUNTER — Encounter (HOSPITAL_BASED_OUTPATIENT_CLINIC_OR_DEPARTMENT_OTHER): Payer: Self-pay | Admitting: Cardiovascular Disease

## 2023-06-16 ENCOUNTER — Other Ambulatory Visit (HOSPITAL_BASED_OUTPATIENT_CLINIC_OR_DEPARTMENT_OTHER): Payer: Self-pay

## 2023-06-16 ENCOUNTER — Ambulatory Visit (INDEPENDENT_AMBULATORY_CARE_PROVIDER_SITE_OTHER): Admitting: Cardiovascular Disease

## 2023-06-16 VITALS — BP 118/70 | HR 90 | Ht 63.0 in | Wt 148.6 lb

## 2023-06-16 DIAGNOSIS — R931 Abnormal findings on diagnostic imaging of heart and coronary circulation: Secondary | ICD-10-CM

## 2023-06-16 DIAGNOSIS — R079 Chest pain, unspecified: Secondary | ICD-10-CM | POA: Diagnosis not present

## 2023-06-16 DIAGNOSIS — E785 Hyperlipidemia, unspecified: Secondary | ICD-10-CM

## 2023-06-16 MED ORDER — DIAZEPAM 5 MG PO TABS
ORAL_TABLET | ORAL | 0 refills | Status: AC
  Filled 2023-06-16: qty 2, 2d supply, fill #0

## 2023-06-16 NOTE — Progress Notes (Signed)
 Cardiology Office Note:  .   Date:  06/16/2023  ID:  Jeanette Rodriguez, DOB [birth year], MRN [id number] PCP: Olin Bertin, MD  [facility] Providers Cardiologist:  None     History of Present Illness: .    Jeanette Rodriguez is a 68 y.o. female with hyperlpidemia and coronary calcification here for evaluation of elevated coronary calcium score.  Ms. Vitello had a coronary calcium score 05/2023 which revealed a score of 251, which was 83rd percentile for age and gender.  She also had aortic atherosclerosis.   Discussed the use of AI scribe software for clinical note transcription with the patient, who gave verbal consent to proceed.  History of Present Illness Ms. Gotts experiences significant muscle aches with the use of pitavastatin, similar to her previous experience with atorvastatin. The pain is described as 'unbelievable' and consistent, impacting her ability to engage in daily activities and exercise. Despite efforts to manage cholesterol through medication, she has been unable to tolerate statins due to these muscle aches.  She has a history of coronary artery disease, with a calcium score of 251, placing her in the 88th percentile for her age and gender. She recalls being informed about plaque in her left anterior descending artery. She maintains a regular exercise routine, utilizing various machines and engaging in workouts for one to two hours. She feels great during exercise but experiences occasional pressure in her chest when moving, though not during workouts. This pressure does not cause shortness of breath and occurs infrequently.  Her diet is low-calorie, consisting of yogurt, fruit, and a single meal per day, supplemented by protein bars. She is currently on Wegovy , which she finds effective for weight management. She has lost significant weight, from 255 pounds to 148 pounds, though she notes a slight increase recently due to reduced exercise.  She has a strong family history of  heart disease, with her father dying of a heart attack at 22, her mother having a pig valve replacement and passing at 54, and her brother managing high cholesterol with medication. All her grandparents and great-grandparents died of heart attacks.  She is recovering from a sinus infection and is on medication for it, with four days remaining. She has experienced bouts of indigestion since April 15, which she describes as feeling like 'indigestion'.  ROS:  As per HPI  Studies Reviewed: Aaron Aas   EKG Interpretation Date/Time:  Friday June 16 2023 10:04:03 EDT Ventricular Rate:  90 PR Interval:  150 QRS Duration:  78 QT Interval:  376 QTC Calculation: 459 R Axis:   74  Text Interpretation: Normal sinus rhythm Normal ECG When compared with ECG of 02-Aug-2021 08:03, T wave amplitude has increased in Anterior leads Confirmed by Maudine Sos ([id number]) on 06/16/2023 10:07:59 AM    Coronary calcium score 04/2023: IMPRESSION: Coronary calcium score of 251. This was 88th percentile for age-, race-, and sex-matched controls. Aortic atherosclerosis noted.    Risk Assessment/Calculations:             Physical Exam:   VS:  BP 118/70   Pulse 90   Ht 5\' 3"  (1.6 m)   Wt 148 lb 9.6 oz (67.4 kg)   SpO2 99%   BMI 26.32 kg/m  , BMI Body mass index is 26.32 kg/m. GENERAL:  Well appearing HEENT: Pupils equal round and reactive, fundi not visualized, oral mucosa unremarkable NECK:  No jugular venous distention, waveform within normal limits, carotid upstroke brisk and symmetric, no bruits, no thyromegaly LUNGS:  Clear  to auscultation bilaterally HEART:  RRR.  PMI not displaced or sustained,S1 and S2 within normal limits, no S3, no S4, no clicks, no rubs, no murmurs ABD:  Flat, positive bowel sounds normal in frequency in pitch, no bruits, no rebound, no guarding, no midline pulsatile mass, no hepatomegaly, no splenomegaly EXT:  2 plus pulses throughout, no edema, no cyanosis no clubbing SKIN:  No  rashes no nodules NEURO:  Cranial nerves II through XII grossly intact, motor grossly intact throughout PSYCH:  Cognitively intact, oriented to person place and time   ASSESSMENT AND PLAN: .    Assessment & Plan # Angina: # Coronary calcification: Intermittent chest pressure with exertion suggests significant coronary artery blockage. Discussed CT scan versus heart catheterization. Heart catheterization preferred for immediate stent placement if needed. Reassured her about sedation and comfort measures. - Schedule heart catheterization at [facility]. - Prescribe diazepam  for anxiety management prior to the procedure.  # Hyperlipidemia Intolerance to statins due to myalgia. LDL cholesterol at 156 mg/dL, target below 70 mg/dL. Discussed non-statin options; inclisiran chosen for less frequent dosing and aversion to self-injection. - Initiate inclisiran pending insurance approval.  # Multiple surgeries Extensive surgical history with ongoing musculoskeletal pain exacerbated by statin use.  # Follow-up Coordination of follow-up plans for procedures and medication initiation. - Coordinate scheduling of heart catheterization and inclisiran initiation. - Order echocardiogram to assess cardiac function. - Provide post-heart catheterization follow-up instructions, including activity restrictions and travel considerations.  Informed Consent   Shared Decision Making/Informed Consent The risks [stroke (1 in 1000), death (1 in 1000), kidney failure [usually temporary] (1 in 500), bleeding (1 in 200), allergic reaction [possibly serious] (1 in 200)], benefits (diagnostic support and management of coronary artery disease) and alternatives of a cardiac catheterization were discussed in detail with Ms. Flott and she is willing to proceed.     Dispo: f/u in 6 months  Signed, Maudine Sos, MD

## 2023-06-16 NOTE — Addendum Note (Signed)
 Addended by: Marci Setter B on: 06/16/2023 01:46 PM   Modules accepted: Orders

## 2023-06-16 NOTE — Patient Instructions (Addendum)
 Medication Instructions:  START ASPRIN 81 MG DAILY   STOP PITAVASTATIN   WILL START PROCESS FOR LEQVIO   TAKE DIAZEPAM  NIGHT BEFORE AND MORNING OF PROCEDURE   Labwork: BMET/CBC TODAY   Testing/Procedures: Your physician has requested that you have a cardiac catheterization. Cardiac catheterization is used to diagnose and/or treat various heart conditions. Doctors may recommend this procedure for a number of different reasons. The most common reason is to evaluate chest pain. Chest pain can be a symptom of coronary artery disease (CAD), and cardiac catheterization can show whether plaque is narrowing or blocking your heart's arteries. This procedure is also used to evaluate the valves, as well as measure the blood flow and oxygen levels in different parts of your heart. For further information please visit [email or web address] Please follow instruction sheet, as given.  Your physician has requested that you have an echocardiogram. Echocardiography is a painless test that uses sound waves to create images of your heart. It provides your doctor with information about the size and shape of your heart and how well your heart's chambers and valves are working. This procedure takes approximately one hour. There are no restrictions for this procedure. Please do NOT wear cologne, perfume, aftershave, or lotions (deodorant is allowed). Please arrive 15 minutes prior to your appointment time.  Please note: We ask at that you not bring children with you during ultrasound (echo/ vascular) testing. Due to room size and safety concerns, children are not allowed in the ultrasound rooms during exams. Our front office staff cannot provide observation of children in our lobby area while testing is being conducted. An adult accompanying a patient to their appointment will only be allowed in the ultrasound room at the discretion of the ultrasound technician under special circumstances. We apologize for any  inconvenience.   Follow-Up: 6 MONTHS WITH DR [place], CAITLIN NP, OR MICHELLE NP   Any Other Special Instructions Will Be Listed Below (If Applicable)  [facility] HEART & VASCULAR AT [facility] [address] [place] [address] Dept: [phone]  Theo Schmelzer  06/16/2023  You are scheduled for a Cardiac Catheterization on Tuesday, April 29 with Dr.  Berry Bristol .  1. Please arrive at the [facility] (Main Entrance A) at [facility]: [address] [place], [place] [address] at 10:00 AM (This time is 2 hour(s) before your procedure to ensure your preparation).   Free valet parking service is available. You will check in at ADMITTING. The support person will be asked to wait in the waiting room.  It is OK to have someone drop you off and come back when you are ready to be discharged.    Special note: Every effort is made to have your procedure done on time. Please understand that emergencies sometimes delay scheduled procedures.  2. Diet: Do not eat solid foods after midnight.  The patient may have clear liquids until 5am upon the day of the procedure.  3. Labs: You will need to have blood drawn TODAY   4. Medication instructions in preparation for your procedure:   Contrast Allergy: No HOLD YOUR WEGOVY  DOSE SUNDAY   On the morning of your procedure, take your Aspirin  81 mg and any morning medicines NOT listed above.  You may use sips of water .  5. Plan to go home the same day, you will only stay overnight if medically necessary. 6. Bring a current list of your medications and current insurance cards. 7. You MUST  have a responsible person to drive you home. 8. Someone MUST be with you the first 24 hours after you arrive home or your discharge will be delayed. 9. Please wear clothes that are easy to get on and off and wear slip-on shoes.  Thank you for allowing us  to care for you!   -- [facility]

## 2023-06-16 NOTE — H&P (View-Only) (Signed)
 Cardiology Office Note:  .   Date:  06/16/2023  ID:  Jeanette Rodriguez, DOB [birth year], MRN [id number] PCP: Olin Bertin, MD  [facility] Providers Cardiologist:  None     History of Present Illness: .    Jeanette Rodriguez is a 68 y.o. female with hyperlpidemia and coronary calcification here for evaluation of elevated coronary calcium score.  Jeanette Rodriguez had a coronary calcium score 05/2023 which revealed a score of 251, which was 83rd percentile for age and gender.  She also had aortic atherosclerosis.   Discussed the use of AI scribe software for clinical note transcription with the patient, who gave verbal consent to proceed.  History of Present Illness Jeanette Rodriguez experiences significant muscle aches with the use of pitavastatin, similar to her previous experience with atorvastatin. The pain is described as 'unbelievable' and consistent, impacting her ability to engage in daily activities and exercise. Despite efforts to manage cholesterol through medication, she has been unable to tolerate statins due to these muscle aches.  She has a history of coronary artery disease, with a calcium score of 251, placing her in the 88th percentile for her age and gender. She recalls being informed about plaque in her left anterior descending artery. She maintains a regular exercise routine, utilizing various machines and engaging in workouts for one to two hours. She feels great during exercise but experiences occasional pressure in her chest when moving, though not during workouts. This pressure does not cause shortness of breath and occurs infrequently.  Her diet is low-calorie, consisting of yogurt, fruit, and a single meal per day, supplemented by protein bars. She is currently on Wegovy , which she finds effective for weight management. She has lost significant weight, from 255 pounds to 148 pounds, though she notes a slight increase recently due to reduced exercise.  She has a strong family history of  heart disease, with her father dying of a heart attack at 22, her mother having a pig valve replacement and passing at 54, and her brother managing high cholesterol with medication. All her grandparents and great-grandparents died of heart attacks.  She is recovering from a sinus infection and is on medication for it, with four days remaining. She has experienced bouts of indigestion since April 15, which she describes as feeling like 'indigestion'.  ROS:  As per HPI  Studies Reviewed: Aaron Aas   EKG Interpretation Date/Time:  Friday June 16 2023 10:04:03 EDT Ventricular Rate:  90 PR Interval:  150 QRS Duration:  78 QT Interval:  376 QTC Calculation: 459 R Axis:   74  Text Interpretation: Normal sinus rhythm Normal ECG When compared with ECG of 02-Aug-2021 08:03, T wave amplitude has increased in Anterior leads Confirmed by Maudine Sos ([id number]) on 06/16/2023 10:07:59 AM    Coronary calcium score 04/2023: IMPRESSION: Coronary calcium score of 251. This was 88th percentile for age-, race-, and sex-matched controls. Aortic atherosclerosis noted.    Risk Assessment/Calculations:             Physical Exam:   VS:  BP 118/70   Pulse 90   Ht 5\' 3"  (1.6 m)   Wt 148 lb 9.6 oz (67.4 kg)   SpO2 99%   BMI 26.32 kg/m  , BMI Body mass index is 26.32 kg/m. GENERAL:  Well appearing HEENT: Pupils equal round and reactive, fundi not visualized, oral mucosa unremarkable NECK:  No jugular venous distention, waveform within normal limits, carotid upstroke brisk and symmetric, no bruits, no thyromegaly LUNGS:  Clear  to auscultation bilaterally HEART:  RRR.  PMI not displaced or sustained,S1 and S2 within normal limits, no S3, no S4, no clicks, no rubs, no murmurs ABD:  Flat, positive bowel sounds normal in frequency in pitch, no bruits, no rebound, no guarding, no midline pulsatile mass, no hepatomegaly, no splenomegaly EXT:  2 plus pulses throughout, no edema, no cyanosis no clubbing SKIN:  No  rashes no nodules NEURO:  Cranial nerves II through XII grossly intact, motor grossly intact throughout PSYCH:  Cognitively intact, oriented to person place and time   ASSESSMENT AND PLAN: .    Assessment & Plan # Angina: # Coronary calcification: Intermittent chest pressure with exertion suggests significant coronary artery blockage. Discussed CT scan versus heart catheterization. Heart catheterization preferred for immediate stent placement if needed. Reassured her about sedation and comfort measures. - Schedule heart catheterization at [facility]. - Prescribe diazepam  for anxiety management prior to the procedure.  # Hyperlipidemia Intolerance to statins due to myalgia. LDL cholesterol at 156 mg/dL, target below 70 mg/dL. Discussed non-statin options; inclisiran chosen for less frequent dosing and aversion to self-injection. - Initiate inclisiran pending insurance approval.  # Multiple surgeries Extensive surgical history with ongoing musculoskeletal pain exacerbated by statin use.  # Follow-up Coordination of follow-up plans for procedures and medication initiation. - Coordinate scheduling of heart catheterization and inclisiran initiation. - Order echocardiogram to assess cardiac function. - Provide post-heart catheterization follow-up instructions, including activity restrictions and travel considerations.  Informed Consent   Shared Decision Making/Informed Consent The risks [stroke (1 in 1000), death (1 in 1000), kidney failure [usually temporary] (1 in 500), bleeding (1 in 200), allergic reaction [possibly serious] (1 in 200)], benefits (diagnostic support and management of coronary artery disease) and alternatives of a cardiac catheterization were discussed in detail with Jeanette Rodriguez and she is willing to proceed.     Dispo: f/u in 6 months  Signed, Maudine Sos, MD

## 2023-06-17 LAB — CBC WITH DIFFERENTIAL/PLATELET
Basophils Absolute: 0.1 10*3/uL (ref 0.0–0.2)
Basos: 2 %
EOS (ABSOLUTE): 0.2 10*3/uL (ref 0.0–0.4)
Eos: 3 %
Hematocrit: 43.4 % (ref 34.0–46.6)
Hemoglobin: 14.2 g/dL (ref 11.1–15.9)
Immature Grans (Abs): 0 10*3/uL (ref 0.0–0.1)
Immature Granulocytes: 0 %
Lymphocytes Absolute: 2.4 10*3/uL (ref 0.7–3.1)
Lymphs: 35 %
MCH: 29.8 pg (ref 26.6–33.0)
MCHC: 32.7 g/dL (ref 31.5–35.7)
MCV: 91 fL (ref 79–97)
Monocytes Absolute: 0.5 10*3/uL (ref 0.1–0.9)
Monocytes: 8 %
Neutrophils Absolute: 3.6 10*3/uL (ref 1.4–7.0)
Neutrophils: 52 %
Platelets: 425 10*3/uL (ref 150–450)
RBC: 4.76 x10E6/uL (ref 3.77–5.28)
RDW: 12.8 % (ref 11.7–15.4)
WBC: 6.8 10*3/uL (ref 3.4–10.8)

## 2023-06-17 LAB — BASIC METABOLIC PANEL WITH GFR
BUN/Creatinine Ratio: 20 (ref 12–28)
BUN: 15 mg/dL (ref 8–27)
CO2: 23 mmol/L (ref 20–29)
Calcium: 10.2 mg/dL (ref 8.7–10.3)
Chloride: 104 mmol/L (ref 96–106)
Creatinine, Ser: 0.74 mg/dL (ref 0.57–1.00)
Glucose: 76 mg/dL (ref 70–99)
Potassium: 5.3 mmol/L — ABNORMAL HIGH (ref 3.5–5.2)
Sodium: 145 mmol/L — ABNORMAL HIGH (ref 134–144)
eGFR: 88 mL/min/{1.73_m2} (ref >59)

## 2023-06-19 ENCOUNTER — Telehealth: Payer: Self-pay | Admitting: *Deleted

## 2023-06-19 ENCOUNTER — Other Ambulatory Visit (HOSPITAL_BASED_OUTPATIENT_CLINIC_OR_DEPARTMENT_OTHER): Payer: Self-pay

## 2023-06-19 NOTE — Telephone Encounter (Addendum)
 Cardiac Catheterization scheduled at [facility] for: Tuesday June 20, 2023 12 Noon Arrival time [facility] Main Entrance A at: 10 AM  Nothing to eat after midnight prior to procedure, clear liquids until 5 AM day of procedure.  Medication instructions: -Usual morning medications can be taken with sips of water  including aspirin  81 mg.  Wegovy -weekly on Sundays (pt did not take Wegovy  4/27, not taking lasix  or hydrochlorothiazide ).   Plan to go home the same day, you will only stay overnight if medically necessary.  You must have responsible adult to drive you home.  Someone must be with you the first 24 hours after you arrive home.  Reviewed procedure instructions with patient.

## 2023-06-20 ENCOUNTER — Encounter (HOSPITAL_COMMUNITY)
Admission: RE | Disposition: A | Discharge status: Home / Self Care | Payer: Self-pay | Source: Home / Self Care | Attending: Cardiology

## 2023-06-20 ENCOUNTER — Other Ambulatory Visit: Payer: Self-pay

## 2023-06-20 ENCOUNTER — Ambulatory Visit (HOSPITAL_COMMUNITY)
Admission: RE | Admit: 2023-06-20 | Discharge: 2023-06-20 | Disposition: A | Discharge status: Home / Self Care | Attending: Cardiology | Admitting: Cardiology

## 2023-06-20 ENCOUNTER — Other Ambulatory Visit (HOSPITAL_BASED_OUTPATIENT_CLINIC_OR_DEPARTMENT_OTHER): Payer: Self-pay

## 2023-06-20 DIAGNOSIS — R931 Abnormal findings on diagnostic imaging of heart and coronary circulation: Secondary | ICD-10-CM

## 2023-06-20 DIAGNOSIS — I2510 Atherosclerotic heart disease of native coronary artery without angina pectoris: Secondary | ICD-10-CM

## 2023-06-20 DIAGNOSIS — Z8249 Family history of ischemic heart disease and other diseases of the circulatory system: Secondary | ICD-10-CM | POA: Diagnosis not present

## 2023-06-20 DIAGNOSIS — E785 Hyperlipidemia, unspecified: Secondary | ICD-10-CM | POA: Insufficient documentation

## 2023-06-20 DIAGNOSIS — I25119 Atherosclerotic heart disease of native coronary artery with unspecified angina pectoris: Secondary | ICD-10-CM | POA: Insufficient documentation

## 2023-06-20 DIAGNOSIS — E119 Type 2 diabetes mellitus without complications: Secondary | ICD-10-CM | POA: Diagnosis not present

## 2023-06-20 DIAGNOSIS — I10 Essential (primary) hypertension: Secondary | ICD-10-CM | POA: Diagnosis not present

## 2023-06-20 DIAGNOSIS — R079 Chest pain, unspecified: Secondary | ICD-10-CM | POA: Diagnosis present

## 2023-06-20 DIAGNOSIS — Z79899 Other long term (current) drug therapy: Secondary | ICD-10-CM | POA: Insufficient documentation

## 2023-06-20 DIAGNOSIS — I700 Atherosclerosis of aorta: Secondary | ICD-10-CM | POA: Diagnosis not present

## 2023-06-20 DIAGNOSIS — I2584 Coronary atherosclerosis due to calcified coronary lesion: Secondary | ICD-10-CM | POA: Insufficient documentation

## 2023-06-20 HISTORY — PX: LEFT HEART CATH AND CORONARY ANGIOGRAPHY: CATH118249

## 2023-06-20 SURGERY — LEFT HEART CATH AND CORONARY ANGIOGRAPHY
Anesthesia: LOCAL

## 2023-06-20 MED ORDER — VERAPAMIL HCL 2.5 MG/ML IV SOLN
INTRAVENOUS | Status: DC | PRN
  Administered 2023-06-20: 10 mL via INTRA_ARTERIAL

## 2023-06-20 MED ORDER — SODIUM CHLORIDE 0.9 % IV SOLN: 250.0000 mL | INTRAVENOUS | Status: DC | PRN

## 2023-06-20 MED ORDER — LIDOCAINE HCL (PF) 1 % IJ SOLN
INTRAMUSCULAR | Status: DC | PRN
Start: 2023-06-20 — End: 2023-06-20
  Administered 2023-06-20: 2 mL

## 2023-06-20 MED ORDER — SODIUM CHLORIDE 0.9% FLUSH: 3.0000 mL | INTRAVENOUS | Status: DC | PRN

## 2023-06-20 MED ORDER — HEPARIN SODIUM (PORCINE) 1000 UNIT/ML IJ SOLN
INTRAMUSCULAR | Status: DC | PRN
Start: 2023-06-20 — End: 2023-06-20
  Administered 2023-06-20: 4000 [IU] via INTRAVENOUS

## 2023-06-20 MED ORDER — HEPARIN SODIUM (PORCINE) 1000 UNIT/ML IJ SOLN
INTRAMUSCULAR | Status: AC
  Filled 2023-06-20: qty 10

## 2023-06-20 MED ORDER — AMLODIPINE BESYLATE 5 MG PO TABS
5.0000 mg | ORAL_TABLET | Freq: Every day | ORAL | 1 refills | Status: DC
  Filled 2023-06-20: qty 30, 30d supply, fill #0
  Filled 2023-07-13: qty 30, 30d supply, fill #1

## 2023-06-20 MED ORDER — SODIUM CHLORIDE 0.9 % WEIGHT BASED INFUSION
3.0000 mL/kg/h | INTRAVENOUS | Status: AC
Start: 2023-06-20 — End: 2023-06-20

## 2023-06-20 MED ORDER — ONDANSETRON HCL 4 MG/2ML IJ SOLN: 4.0000 mg | Freq: Four times a day (QID) | INTRAMUSCULAR | Status: DC | PRN

## 2023-06-20 MED ORDER — LIDOCAINE HCL (PF) 1 % IJ SOLN
INTRAMUSCULAR | Status: AC
  Filled 2023-06-20: qty 30

## 2023-06-20 MED ORDER — MIDAZOLAM HCL 2 MG/2ML IJ SOLN
INTRAMUSCULAR | Status: AC
  Filled 2023-06-20: qty 2

## 2023-06-20 MED ORDER — FENTANYL CITRATE (PF) 100 MCG/2ML IJ SOLN
INTRAMUSCULAR | Status: AC
  Filled 2023-06-20: qty 2

## 2023-06-20 MED ORDER — EZETIMIBE 10 MG PO TABS
10.0000 mg | ORAL_TABLET | Freq: Every day | ORAL | 1 refills | Status: DC
  Filled 2023-06-20: qty 30, 30d supply, fill #0
  Filled 2023-07-13: qty 30, 30d supply, fill #1

## 2023-06-20 MED ORDER — ASPIRIN 81 MG PO CHEW: 81.0000 mg | CHEWABLE_TABLET | ORAL | Status: DC

## 2023-06-20 MED ORDER — VERAPAMIL HCL 2.5 MG/ML IV SOLN
INTRAVENOUS | Status: AC
Start: 2023-06-20 — End: ?
  Filled 2023-06-20: qty 2

## 2023-06-20 MED ORDER — NITROGLYCERIN 0.4 MG SL SUBL
0.4000 mg | SUBLINGUAL_TABLET | SUBLINGUAL | 1 refills | Status: AC | PRN
  Filled 2023-06-20: qty 25, 8d supply, fill #0
  Filled 2023-09-13: qty 25, 8d supply, fill #1

## 2023-06-20 MED ORDER — SODIUM CHLORIDE 0.9 % WEIGHT BASED INFUSION
1.0000 mL/kg/h | INTRAVENOUS | Status: DC
Start: 2023-06-20 — End: 2023-06-20

## 2023-06-20 MED ORDER — FENTANYL CITRATE (PF) 100 MCG/2ML IJ SOLN
INTRAMUSCULAR | Status: DC | PRN
  Administered 2023-06-20: 50 ug via INTRAVENOUS

## 2023-06-20 MED ORDER — MIDAZOLAM HCL 2 MG/2ML IJ SOLN
INTRAMUSCULAR | Status: DC | PRN
  Administered 2023-06-20: 2 mg via INTRAVENOUS

## 2023-06-20 MED ORDER — HEPARIN (PORCINE) IN NACL 1000-0.9 UT/500ML-% IV SOLN
INTRAVENOUS | Status: DC | PRN
  Administered 2023-06-20 (×2): 500 mL
  Administered 2023-06-20: 1000 mL via SURGICAL_CAVITY

## 2023-06-20 MED ORDER — METOPROLOL SUCCINATE ER 25 MG PO TB24
25.0000 mg | ORAL_TABLET | Freq: Every day | ORAL | 1 refills | Status: DC
  Filled 2023-06-20: qty 30, 30d supply, fill #0
  Filled 2023-07-13: qty 30, 30d supply, fill #1

## 2023-06-20 MED ORDER — IOHEXOL 350 MG/ML SOLN
INTRAVENOUS | Status: DC | PRN
  Administered 2023-06-20: 30 mL

## 2023-06-20 MED ORDER — ACETAMINOPHEN 325 MG PO TABS: 650.0000 mg | ORAL_TABLET | ORAL | Status: DC | PRN

## 2023-06-20 SURGICAL SUPPLY — 7 items
CATH INFINITI AMBI 5FR TG (CATHETERS) IMPLANT
DEVICE RAD COMP TR BAND LRG (VASCULAR PRODUCTS) IMPLANT
GLIDESHEATH SLEND A-KIT 6F 22G (SHEATH) IMPLANT
GUIDEWIRE INQWIRE 1.5J.035X260 (WIRE) IMPLANT
PACK CARDIAC CATHETERIZATION (CUSTOM PROCEDURE TRAY) ×1 IMPLANT
SET ATX-X65L (MISCELLANEOUS) IMPLANT
STATION PROTECTION PRESSURIZED (MISCELLANEOUS) IMPLANT

## 2023-06-20 NOTE — Discharge Instructions (Signed)

## 2023-06-20 NOTE — Interval H&P Note (Signed)
 History and Physical Interval Note:  06/20/2023 12:55 PM  Jeanette Rodriguez  has presented today for surgery, with the diagnosis of chest pain.  The various methods of treatment have been discussed with the patient and family. After consideration of risks, benefits and other options for treatment, the patient has consented to  Procedure(s): LEFT HEART CATH AND CORONARY ANGIOGRAPHY (N/A) and possible coronary angioplasty as a surgical intervention.  The patient's history has been reviewed, patient examined, no change in status, stable for surgery.  I have reviewed the patient's chart and labs.  Questions were answered to the patient's satisfaction.   Cath Lab Visit (complete for each Cath Lab visit)  Clinical Evaluation Leading to the Procedure:   ACS: No.  Non-ACS:    Anginal Classification: CCS II  Anti-ischemic medical therapy: No Therapy  Non-Invasive Test Results: No non-invasive testing performed  Prior CABG: No previous CABG   Knox Perl

## 2023-06-21 ENCOUNTER — Encounter (HOSPITAL_COMMUNITY): Payer: Self-pay | Admitting: Cardiology

## 2023-06-24 ENCOUNTER — Other Ambulatory Visit (HOSPITAL_BASED_OUTPATIENT_CLINIC_OR_DEPARTMENT_OTHER): Payer: Self-pay

## 2023-06-27 ENCOUNTER — Other Ambulatory Visit (HOSPITAL_BASED_OUTPATIENT_CLINIC_OR_DEPARTMENT_OTHER): Payer: Self-pay

## 2023-06-28 ENCOUNTER — Encounter (HOSPITAL_BASED_OUTPATIENT_CLINIC_OR_DEPARTMENT_OTHER): Payer: Self-pay

## 2023-06-28 ENCOUNTER — Other Ambulatory Visit (HOSPITAL_BASED_OUTPATIENT_CLINIC_OR_DEPARTMENT_OTHER): Payer: Self-pay

## 2023-06-29 ENCOUNTER — Other Ambulatory Visit: Payer: Self-pay | Admitting: Pharmacist Clinician (PhC)/ Clinical Pharmacy Specialist

## 2023-06-29 ENCOUNTER — Telehealth: Payer: Self-pay | Admitting: Pharmacy Technician

## 2023-06-29 DIAGNOSIS — E785 Hyperlipidemia, unspecified: Secondary | ICD-10-CM | POA: Insufficient documentation

## 2023-06-29 NOTE — Telephone Encounter (Signed)
 Kistin,  We received a BIV from Leqvio service center. We currently do not have a treatment plan entered. Please enter a treatment and we will submit and auth.  Once I have a response from the insurance we will f/u. Thanks Burdette Carolin

## 2023-06-29 NOTE — Telephone Encounter (Signed)
Its in thanks.

## 2023-07-10 ENCOUNTER — Other Ambulatory Visit (HOSPITAL_BASED_OUTPATIENT_CLINIC_OR_DEPARTMENT_OTHER): Payer: Self-pay

## 2023-07-10 MED ORDER — OXYCODONE-ACETAMINOPHEN 10-325 MG PO TABS
1.0000 | ORAL_TABLET | Freq: Four times a day (QID) | ORAL | 0 refills | Status: DC | PRN
  Filled 2023-07-10 – 2023-07-11 (×2): qty 120, 30d supply, fill #0

## 2023-07-11 ENCOUNTER — Other Ambulatory Visit (HOSPITAL_BASED_OUTPATIENT_CLINIC_OR_DEPARTMENT_OTHER): Payer: Self-pay

## 2023-07-11 ENCOUNTER — Telehealth: Payer: Self-pay

## 2023-07-11 NOTE — Telephone Encounter (Signed)
 Dr. Theodis Fiscal and Kristin, patient will be scheduled as soon as possible.  Auth Submission: NO AUTH NEEDED Site of care: Site of care: [facility] Payer: Medicare A/B with BCBS FEP Medication & CPT/J Code(s) submitted: Leqvio (Inclisiran) J1306 Route of submission (phone, fax, portal): fax  Phone # Fax # [phone]  Auth type: Buy/Bill PB Units/visits requested: 284mg  x 2 doses Reference number: [id number] Approval from: 07/11/23 to 03/23/24  I called [employer] FEP and confirmed that a prior auth is not needed when the patient has Medicare A/B as primary.

## 2023-07-13 ENCOUNTER — Other Ambulatory Visit (HOSPITAL_BASED_OUTPATIENT_CLINIC_OR_DEPARTMENT_OTHER): Payer: Self-pay

## 2023-07-20 ENCOUNTER — Encounter (HOSPITAL_BASED_OUTPATIENT_CLINIC_OR_DEPARTMENT_OTHER): Payer: Self-pay | Admitting: Cardiovascular Disease

## 2023-07-24 ENCOUNTER — Ambulatory Visit (INDEPENDENT_AMBULATORY_CARE_PROVIDER_SITE_OTHER)

## 2023-07-24 DIAGNOSIS — R079 Chest pain, unspecified: Secondary | ICD-10-CM

## 2023-07-24 LAB — ECHOCARDIOGRAM COMPLETE
Area-P 1/2: 3.91 cm2
S' Lateral: 2.39 cm

## 2023-07-25 ENCOUNTER — Ambulatory Visit: Payer: Self-pay | Admitting: Cardiovascular Disease

## 2023-07-26 ENCOUNTER — Other Ambulatory Visit (HOSPITAL_BASED_OUTPATIENT_CLINIC_OR_DEPARTMENT_OTHER): Payer: Self-pay

## 2023-07-26 MED ORDER — AMLODIPINE BESYLATE 5 MG PO TABS
5.0000 mg | ORAL_TABLET | Freq: Every day | ORAL | 3 refills | Status: AC
  Filled 2023-07-26 – 2023-08-19 (×2): qty 90, 90d supply, fill #0
  Filled 2023-11-04: qty 90, 90d supply, fill #1
  Filled 2024-01-25: qty 90, 90d supply, fill #2

## 2023-07-26 MED ORDER — EZETIMIBE 10 MG PO TABS
10.0000 mg | ORAL_TABLET | Freq: Every day | ORAL | 3 refills | Status: AC
  Filled 2023-07-26 – 2023-08-19 (×2): qty 90, 90d supply, fill #0
  Filled 2023-11-04: qty 90, 90d supply, fill #1
  Filled 2024-01-25: qty 90, 90d supply, fill #2

## 2023-07-26 MED ORDER — METOPROLOL SUCCINATE ER 25 MG PO TB24
25.0000 mg | ORAL_TABLET | Freq: Every day | ORAL | 1 refills | Status: DC
  Filled 2023-07-26 – 2023-08-19 (×2): qty 90, 90d supply, fill #0
  Filled 2023-10-09 – 2023-11-04 (×2): qty 90, 90d supply, fill #1

## 2023-07-27 ENCOUNTER — Other Ambulatory Visit (HOSPITAL_BASED_OUTPATIENT_CLINIC_OR_DEPARTMENT_OTHER): Payer: Self-pay

## 2023-07-28 ENCOUNTER — Other Ambulatory Visit (HOSPITAL_BASED_OUTPATIENT_CLINIC_OR_DEPARTMENT_OTHER): Payer: Self-pay

## 2023-08-03 ENCOUNTER — Ambulatory Visit

## 2023-08-03 VITALS — BP 116/72 | HR 60 | Temp 98.3°F | Resp 18 | Ht 63.0 in | Wt 153.4 lb

## 2023-08-03 DIAGNOSIS — E785 Hyperlipidemia, unspecified: Secondary | ICD-10-CM

## 2023-08-03 MED ORDER — INCLISIRAN SODIUM 284 MG/1.5ML ~~LOC~~ SOSY
284.0000 mg | PREFILLED_SYRINGE | Freq: Once | SUBCUTANEOUS | Status: AC
  Administered 2023-08-03: 284 mg via SUBCUTANEOUS
  Filled 2023-08-03: qty 1.5

## 2023-08-03 NOTE — Progress Notes (Signed)
 Diagnosis: Hyperlipidemia  Provider:  Mannam, Praveen MD  Procedure: Injection  Leqvio (inclisiran), Dose: 284 mg, Site: subcutaneous, Number of injections: 1  Injection Site(s): Left vastus lateralis  Post Care: Observation period completed  Discharge: Condition: Good, Destination: Home . AVS Provided  Performed by:  Matraca Hunkins, RN

## 2023-08-03 NOTE — Patient Instructions (Signed)
 Inclisiran Injection What is this medication? INCLISIRAN (in kli SIR an) treats high cholesterol. It works by decreasing bad cholesterol (such as LDL) in your blood. Changes to diet and exercise are often combined with this medication. This medicine may be used for other purposes; ask your health care provider or pharmacist if you have questions. COMMON BRAND NAME(S): LEQVIO What should I tell my care team before I take this medication? They need to know if you have any of these conditions: An unusual or allergic reaction to inclisiran, other medications, foods, dyes, or preservatives Pregnant or trying to get pregnant Breast-feeding How should I use this medication? This medication is injected under the skin. It is given by your care team in a hospital or clinic setting. Talk to your care team about the use of this medication in children. Special care may be needed. Overdosage: If you think you have taken too much of this medicine contact a poison control center or emergency room at once. NOTE: This medicine is only for you. Do not share this medicine with others. What if I miss a dose? Keep appointments for follow-up doses. It is important not to miss your dose. Call your care team if you are unable to keep an appointment. What may interact with this medication? Interactions are not expected. This list may not describe all possible interactions. Give your health care provider a list of all the medicines, herbs, non-prescription drugs, or dietary supplements you use. Also tell them if you smoke, drink alcohol, or use illegal drugs. Some items may interact with your medicine. What should I watch for while using this medication? Visit your care team for regular checks on your progress. Tell your care team if your symptoms do not start to get better or if they get worse. You may need blood work while you are taking this medication. What side effects may I notice from receiving this  medication? Side effects that you should report to your care team as soon as possible: Allergic reactions--skin rash, itching, hives, swelling of the face, lips, tongue, or throat Side effects that usually do not require medical attention (report these to your care team if they continue or are bothersome): Joint pain Pain, redness, or irritation at injection site This list may not describe all possible side effects. Call your doctor for medical advice about side effects. You may report side effects to FDA at 1-800-FDA-1088. Where should I keep my medication? This medication is given in a hospital or clinic. It will not be stored at home. NOTE: This sheet is a summary. It may not cover all possible information. If you have questions about this medicine, talk to your doctor, pharmacist, or health care provider.  2024 Elsevier/Gold Standard (2023-01-20 00:00:00)

## 2023-08-10 ENCOUNTER — Other Ambulatory Visit (HOSPITAL_BASED_OUTPATIENT_CLINIC_OR_DEPARTMENT_OTHER): Payer: Self-pay

## 2023-08-10 MED ORDER — OXYCODONE-ACETAMINOPHEN 10-325 MG PO TABS
1.0000 | ORAL_TABLET | Freq: Four times a day (QID) | ORAL | 0 refills | Status: DC | PRN
  Filled 2023-08-10: qty 120, 30d supply, fill #0

## 2023-08-19 ENCOUNTER — Other Ambulatory Visit (HOSPITAL_BASED_OUTPATIENT_CLINIC_OR_DEPARTMENT_OTHER): Payer: Self-pay

## 2023-08-24 ENCOUNTER — Other Ambulatory Visit (HOSPITAL_BASED_OUTPATIENT_CLINIC_OR_DEPARTMENT_OTHER): Payer: Self-pay

## 2023-09-04 ENCOUNTER — Other Ambulatory Visit (HOSPITAL_BASED_OUTPATIENT_CLINIC_OR_DEPARTMENT_OTHER): Payer: Self-pay

## 2023-09-04 MED ORDER — OXYCODONE-ACETAMINOPHEN 10-325 MG PO TABS
1.0000 | ORAL_TABLET | Freq: Four times a day (QID) | ORAL | 0 refills | Status: DC | PRN
  Filled 2023-09-07: qty 120, 30d supply, fill #0

## 2023-09-07 ENCOUNTER — Other Ambulatory Visit: Payer: Self-pay

## 2023-09-07 ENCOUNTER — Other Ambulatory Visit (HOSPITAL_BASED_OUTPATIENT_CLINIC_OR_DEPARTMENT_OTHER): Payer: Self-pay

## 2023-09-07 MED ORDER — CIPROFLOXACIN HCL 0.3 % OP SOLN
2.0000 [drp] | Freq: Four times a day (QID) | OPHTHALMIC | 0 refills | Status: AC
  Filled 2023-09-07: qty 5, 7d supply, fill #0

## 2023-09-13 ENCOUNTER — Encounter: Payer: Self-pay | Admitting: Cardiovascular Disease

## 2023-09-13 ENCOUNTER — Other Ambulatory Visit (HOSPITAL_BASED_OUTPATIENT_CLINIC_OR_DEPARTMENT_OTHER): Payer: Self-pay

## 2023-09-13 ENCOUNTER — Encounter (HOSPITAL_BASED_OUTPATIENT_CLINIC_OR_DEPARTMENT_OTHER): Payer: Self-pay | Admitting: Cardiovascular Disease

## 2023-09-18 ENCOUNTER — Other Ambulatory Visit (HOSPITAL_BASED_OUTPATIENT_CLINIC_OR_DEPARTMENT_OTHER): Payer: Self-pay

## 2023-09-19 ENCOUNTER — Other Ambulatory Visit (HOSPITAL_BASED_OUTPATIENT_CLINIC_OR_DEPARTMENT_OTHER): Payer: Self-pay

## 2023-09-19 MED ORDER — WEGOVY 2.4 MG/0.75ML ~~LOC~~ SOAJ
2.4000 mg | SUBCUTANEOUS | 3 refills | Status: AC
  Filled 2023-09-19: qty 3, 28d supply, fill #0
  Filled 2023-10-21: qty 3, 28d supply, fill #1
  Filled 2023-11-20: qty 3, 28d supply, fill #2
  Filled 2023-12-11: qty 3, 28d supply, fill #3
  Filled 2024-01-02 – 2024-01-09 (×3): qty 3, 28d supply, fill #4
  Filled 2024-01-10: qty 9, 84d supply, fill #4

## 2023-10-04 ENCOUNTER — Other Ambulatory Visit (HOSPITAL_BASED_OUTPATIENT_CLINIC_OR_DEPARTMENT_OTHER): Payer: Self-pay

## 2023-10-04 MED ORDER — OXYCODONE-ACETAMINOPHEN 10-325 MG PO TABS
1.0000 | ORAL_TABLET | Freq: Four times a day (QID) | ORAL | 0 refills | Status: DC | PRN
  Filled 2023-10-07: qty 120, 30d supply, fill #0
  Filled ????-??-??: fill #0

## 2023-10-06 ENCOUNTER — Emergency Department (HOSPITAL_BASED_OUTPATIENT_CLINIC_OR_DEPARTMENT_OTHER)

## 2023-10-06 ENCOUNTER — Other Ambulatory Visit: Payer: Self-pay

## 2023-10-06 ENCOUNTER — Encounter (HOSPITAL_BASED_OUTPATIENT_CLINIC_OR_DEPARTMENT_OTHER): Payer: Self-pay

## 2023-10-06 ENCOUNTER — Emergency Department (HOSPITAL_BASED_OUTPATIENT_CLINIC_OR_DEPARTMENT_OTHER)
Admission: EM | Admit: 2023-10-06 | Discharge: 2023-10-07 | Disposition: A | Discharge status: Home / Self Care | Attending: Emergency Medicine | Admitting: Emergency Medicine

## 2023-10-06 ENCOUNTER — Emergency Department (HOSPITAL_BASED_OUTPATIENT_CLINIC_OR_DEPARTMENT_OTHER): Admitting: Radiology

## 2023-10-06 DIAGNOSIS — S01411A Laceration without foreign body of right cheek and temporomandibular area, initial encounter: Secondary | ICD-10-CM | POA: Diagnosis not present

## 2023-10-06 DIAGNOSIS — W01198A Fall on same level from slipping, tripping and stumbling with subsequent striking against other object, initial encounter: Secondary | ICD-10-CM | POA: Insufficient documentation

## 2023-10-06 DIAGNOSIS — W19XXXA Unspecified fall, initial encounter: Secondary | ICD-10-CM

## 2023-10-06 DIAGNOSIS — Z794 Long term (current) use of insulin: Secondary | ICD-10-CM | POA: Insufficient documentation

## 2023-10-06 DIAGNOSIS — S0990XA Unspecified injury of head, initial encounter: Secondary | ICD-10-CM

## 2023-10-06 DIAGNOSIS — S0181XA Laceration without foreign body of other part of head, initial encounter: Secondary | ICD-10-CM | POA: Diagnosis not present

## 2023-10-06 DIAGNOSIS — S0993XA Unspecified injury of face, initial encounter: Secondary | ICD-10-CM | POA: Diagnosis present

## 2023-10-06 DIAGNOSIS — Z7982 Long term (current) use of aspirin: Secondary | ICD-10-CM | POA: Insufficient documentation

## 2023-10-06 MED ORDER — LIDOCAINE HCL (PF) 1 % IJ SOLN
10.0000 mL | Freq: Once | INTRAMUSCULAR | Status: AC
  Administered 2023-10-06: 5 mL
  Filled 2023-10-06: qty 10

## 2023-10-06 MED ORDER — LIDOCAINE-EPINEPHRINE-TETRACAINE (LET) TOPICAL GEL
3.0000 mL | Freq: Once | TOPICAL | Status: AC
  Administered 2023-10-06: 3 mL via TOPICAL
  Filled 2023-10-06: qty 3

## 2023-10-06 MED ORDER — OXYCODONE-ACETAMINOPHEN 5-325 MG PO TABS
2.0000 | ORAL_TABLET | Freq: Once | ORAL | Status: AC
  Administered 2023-10-06: 2 via ORAL
  Filled 2023-10-06: qty 2

## 2023-10-06 NOTE — ED Provider Notes (Signed)
 [place] EMERGENCY DEPARTMENT AT [facility] Provider Note   CSN: [id number] Arrival date & time: 10/06/23  1944     Patient presents with: Fall and Laceration   Jeanette Rodriguez is a 68 y.o. female.  {Add pertinent medical, surgical, social history, OB history to [id number]}  Fall  Laceration      Prior to Admission medications   Medication Sig Start Date End Date Taking? Authorizing Provider  albuterol  (PROVENTIL ) (2.5 MG/3ML) 0.083% nebulizer solution Take 2.5 mg by nebulization every 6 (six) hours as needed for wheezing or shortness of breath.    [provider]  amLODipine  (NORVASC ) 5 MG tablet Take 1 tablet (5 mg total) by mouth daily. 07/26/23 11/17/23  Raford Riggs, MD  amoxicillin  (AMOXIL ) 500 MG capsule Take 4 capsules (2,000 mg total) by mouth 1-2 hours prior to dental work. 08/11/22     aspirin  EC 81 MG tablet Take 81 mg by mouth daily. Swallow whole.    [provider]  cetirizine  (ZYRTEC ) 10 MG tablet Take 1 tablet (10 mg total) by mouth daily. 03/09/23     ciprofloxacin  (CILOXAN ) 0.3 % ophthalmic solution Place 1-2 drops into the affected eye(s) 4 (four) times daily. 09/07/23     clotrimazole-betamethasone (LOTRISONE) cream Apply 1 Application topically daily as needed (Rash).    [provider]  diazepam  (VALIUM ) 5 MG tablet TAKE 1 TABLET NIGHT BEFORE AND MORNING OF PROCEDURE 06/16/23   Raford Riggs, MD  diclofenac  Sodium (VOLTAREN ) 1 % GEL APPLY 2 GRAMS TO THE AFFECTED AREA(S) BY TOPICAL ROUTE 4 TIMES PER DAY Patient taking differently: Apply 2 g topically daily as needed (Pain). 12/29/22     DULoxetine  (CYMBALTA ) 60 MG capsule Take 1 capsule (60 mg total) by mouth daily. 01/12/23     ezetimibe  (ZETIA ) 10 MG tablet Take 1 tablet (10 mg total) by mouth daily. 07/26/23 07/25/24  Raford Riggs, MD  fluticasone  (FLONASE ) 50 MCG/ACT nasal spray Place 1 spray into both nostrils daily. 10/10/22     Ketotifen Fumarate (ALLERGY EYE DROPS  OP) Place 1 drop into both eyes daily.    [provider]  Magnesium 100 MG TABS Take 100 mg by mouth at bedtime.    [provider]  methocarbamol  (ROBAXIN ) 500 MG tablet Take 1 tablet (500 mg total) by mouth every 8 (eight) hours as needed for muscle spasms Patient not taking: Reported on 06/16/2023 10/11/22     metoprolol  succinate (TOPROL -XL) 25 MG 24 hr tablet Take 1 tablet (25 mg total) by mouth daily. Take with or immediately following a meal. 07/26/23 01/22/24  Raford Riggs, MD  montelukast  (SINGULAIR ) 10 MG tablet Take 1 tablet (10 mg total) by mouth daily. 10/10/22     Multiple Vitamins-Minerals (MULTIVITAMIN WITH MINERALS) tablet Take 1 tablet by mouth daily.    [provider]  nitroGLYCERIN  (NITROSTAT ) 0.4 MG SL tablet Place 1 tablet (0.4 mg total) under the tongue every 5 (five) minutes as needed for chest pain. 06/20/23 06/19/24  Ladona Heinz, MD  oxyCODONE -acetaminophen  (PERCOCET) 10-325 MG tablet Take 1 tablet by mouth 4 (four) times daily as needed. Patient taking differently: Take 1 tablet by mouth every 6 (six) hours as needed for pain. 06/11/23     oxyCODONE -acetaminophen  (PERCOCET) 10-325 MG tablet Take 1 tablet by mouth 4 (four) times daily as needed. 07/10/23     oxyCODONE -acetaminophen  (PERCOCET) 10-325 MG tablet Take 1 tablet by mouth 4 (four) times daily as needed Do not take with alcohol 08/10/23  oxyCODONE -acetaminophen  (PERCOCET) 10-325 MG tablet Take 1 tablet by mouth 4 (four) times daily as needed. 09/04/23     oxyCODONE -acetaminophen  (PERCOCET) 10-325 MG tablet TAKE 1 TABLET BY MOUTH FOUR TIMES A DAY AS NEEDED 10/04/23     Semaglutide -Weight Management (WEGOVY ) 2.4 MG/0.75ML SOAJ Inject 2.4 mg into the skin once a week. 09/18/23       Allergies: Atorvastatin, Levaquin [levofloxacin], and Pitavastatin    Review of Systems  Updated Vital Signs BP (!) 177/94 (BP Location: Right Arm)   Pulse 84   Temp 98.4 F (36.9 C)   Resp (!) 22   Ht 5' 3  (1.6 m)   Wt 68 kg   SpO2 100%   BMI 26.57 kg/m   Physical Exam  (all labs ordered are listed, but only abnormal results are displayed) Labs Reviewed - No data to display  EKG: None  Radiology: CT Cervical Spine Wo Contrast Result Date: 10/06/2023 EXAM: CT HEAD, FACIAL BONES AND CERVICAL SPINE WITHOUT CONTRAST 10/06/2023 08:35:51 PM TECHNIQUE: CT of the head, facial bones and cervical spine was performed without the administration of intravenous contrast. Multiplanar reformatted images are provided for review. Automated exposure control, iterative reconstruction, and/or weight based adjustment of the mA/kV was utilized to reduce the radiation dose to as low as reasonably achievable. COMPARISON: None available. CLINICAL HISTORY: Head trauma, minor (Age >= 65y). Pt reports tripped and fell and struck R side of face on small tree. Pt has large laceration to R side of face. FINDINGS: CT HEAD BRAIN AND VENTRICLES: Nonspecific hypoattenuation in the periventricular and subcortical white matter, most likely representing chronic small vessel disease. No acute intracranial hemorrhage. No mass effect or midline shift. No extra-axial fluid collection. Gray-white differentiation is maintained. No hydrocephalus. SKULL AND SCALP: No acute skull fracture. No pneumocranium. There is soft tissue swelling and locules of gas in the right temporal scalp extending inferiorly over the zygomatic arch suggestive of contusion and overlying laceration. There is additional irregularity of the skin and superficial subcutaneous tissues extending from the superolateral aspect of the right orbit over the anterior right frontal scalp compatible with laceration. CT FACIAL BONES FACIAL BONES: No acute facial fracture. No mandibular dislocation. No suspicious bone lesion. ORBITS: No acute traumatic injury. SINUSES AND MASTOIDS: There is mucosal thickening in the bilateral maxillary sinuses, left greater than right. Thickening of the  left maxillary sinus wall suggestive of mucoperiostral reaction. Focal secretions within the right sphenoid sinus. SOFT TISSUES: No acute abnormality. CT CERVICAL SPINE BONES AND ALIGNMENT: There is straightening of the normal cervical lordosis. No listhesis. No facet subluxation or dislocation. DEGENERATIVE CHANGES: Degenerative endplate osteophytes at multiple levels. There is space narrowing at multiple levels. Facet arthrosis and uncovertebral hypertrophy at multiple levels. No high-grade osseous spinal canal stenosis. SOFT TISSUES: No prevertebral soft tissue swelling. IMPRESSION: 1. No acute intracranial abnormality. 2. No acute fracture or traumatic malalignment of the cervical spine. 3. No acute fracture of the facial bones. 4. Soft tissue swelling and locules of gas in the right temporal scalp extending inferiorly over the zygomatic arch, suggestive of contusion and overlying laceration. 5. Additional irregularity of the skin and superficial subcutaneous tissues extending from the superolateral aspect of the right orbit over the anterior right frontal scalp, compatible with laceration. Electronically signed by: Donnice Mania MD 10/06/2023 09:17 PM EDT RP Workstation: [id number]   CT Maxillofacial Wo Contrast Result Date: 10/06/2023 EXAM: CT HEAD, FACIAL BONES AND CERVICAL SPINE WITHOUT CONTRAST 10/06/2023 08:35:51 PM TECHNIQUE: CT of the head,  facial bones and cervical spine was performed without the administration of intravenous contrast. Multiplanar reformatted images are provided for review. Automated exposure control, iterative reconstruction, and/or weight based adjustment of the mA/kV was utilized to reduce the radiation dose to as low as reasonably achievable. COMPARISON: None available. CLINICAL HISTORY: Head trauma, minor (Age >= 65y). Pt reports tripped and fell and struck R side of face on small tree. Pt has large laceration to R side of face. FINDINGS: CT HEAD BRAIN AND VENTRICLES: Nonspecific  hypoattenuation in the periventricular and subcortical white matter, most likely representing chronic small vessel disease. No acute intracranial hemorrhage. No mass effect or midline shift. No extra-axial fluid collection. Gray-white differentiation is maintained. No hydrocephalus. SKULL AND SCALP: No acute skull fracture. No pneumocranium. There is soft tissue swelling and locules of gas in the right temporal scalp extending inferiorly over the zygomatic arch suggestive of contusion and overlying laceration. There is additional irregularity of the skin and superficial subcutaneous tissues extending from the superolateral aspect of the right orbit over the anterior right frontal scalp compatible with laceration. CT FACIAL BONES FACIAL BONES: No acute facial fracture. No mandibular dislocation. No suspicious bone lesion. ORBITS: No acute traumatic injury. SINUSES AND MASTOIDS: There is mucosal thickening in the bilateral maxillary sinuses, left greater than right. Thickening of the left maxillary sinus wall suggestive of mucoperiostral reaction. Focal secretions within the right sphenoid sinus. SOFT TISSUES: No acute abnormality. CT CERVICAL SPINE BONES AND ALIGNMENT: There is straightening of the normal cervical lordosis. No listhesis. No facet subluxation or dislocation. DEGENERATIVE CHANGES: Degenerative endplate osteophytes at multiple levels. There is space narrowing at multiple levels. Facet arthrosis and uncovertebral hypertrophy at multiple levels. No high-grade osseous spinal canal stenosis. SOFT TISSUES: No prevertebral soft tissue swelling. IMPRESSION: 1. No acute intracranial abnormality. 2. No acute fracture or traumatic malalignment of the cervical spine. 3. No acute fracture of the facial bones. 4. Soft tissue swelling and locules of gas in the right temporal scalp extending inferiorly over the zygomatic arch, suggestive of contusion and overlying laceration. 5. Additional irregularity of the skin and  superficial subcutaneous tissues extending from the superolateral aspect of the right orbit over the anterior right frontal scalp, compatible with laceration. Electronically signed by: Donnice Mania MD 10/06/2023 09:17 PM EDT RP Workstation: [id number]   CT Head Wo Contrast Result Date: 10/06/2023 EXAM: CT HEAD, FACIAL BONES AND CERVICAL SPINE WITHOUT CONTRAST 10/06/2023 08:35:51 PM TECHNIQUE: CT of the head, facial bones and cervical spine was performed without the administration of intravenous contrast. Multiplanar reformatted images are provided for review. Automated exposure control, iterative reconstruction, and/or weight based adjustment of the mA/kV was utilized to reduce the radiation dose to as low as reasonably achievable. COMPARISON: None available. CLINICAL HISTORY: Head trauma, minor (Age >= 65y). Pt reports tripped and fell and struck R side of face on small tree. Pt has large laceration to R side of face. FINDINGS: CT HEAD BRAIN AND VENTRICLES: Nonspecific hypoattenuation in the periventricular and subcortical white matter, most likely representing chronic small vessel disease. No acute intracranial hemorrhage. No mass effect or midline shift. No extra-axial fluid collection. Gray-white differentiation is maintained. No hydrocephalus. SKULL AND SCALP: No acute skull fracture. No pneumocranium. There is soft tissue swelling and locules of gas in the right temporal scalp extending inferiorly over the zygomatic arch suggestive of contusion and overlying laceration. There is additional irregularity of the skin and superficial subcutaneous tissues extending from the superolateral aspect of the right orbit over the  anterior right frontal scalp compatible with laceration. CT FACIAL BONES FACIAL BONES: No acute facial fracture. No mandibular dislocation. No suspicious bone lesion. ORBITS: No acute traumatic injury. SINUSES AND MASTOIDS: There is mucosal thickening in the bilateral maxillary sinuses, left  greater than right. Thickening of the left maxillary sinus wall suggestive of mucoperiostral reaction. Focal secretions within the right sphenoid sinus. SOFT TISSUES: No acute abnormality. CT CERVICAL SPINE BONES AND ALIGNMENT: There is straightening of the normal cervical lordosis. No listhesis. No facet subluxation or dislocation. DEGENERATIVE CHANGES: Degenerative endplate osteophytes at multiple levels. There is space narrowing at multiple levels. Facet arthrosis and uncovertebral hypertrophy at multiple levels. No high-grade osseous spinal canal stenosis. SOFT TISSUES: No prevertebral soft tissue swelling. IMPRESSION: 1. No acute intracranial abnormality. 2. No acute fracture or traumatic malalignment of the cervical spine. 3. No acute fracture of the facial bones. 4. Soft tissue swelling and locules of gas in the right temporal scalp extending inferiorly over the zygomatic arch, suggestive of contusion and overlying laceration. 5. Additional irregularity of the skin and superficial subcutaneous tissues extending from the superolateral aspect of the right orbit over the anterior right frontal scalp, compatible with laceration. Electronically signed by: Donnice Mania MD 10/06/2023 09:16 PM EDT RP Workstation: [id number]    {Document cardiac monitor, telemetry assessment procedure when appropriate:32947} .Laceration Repair  Date/Time: 10/07/2023 1:12 AM  Performed by: Bernis Ernst, PA-C Authorized by: Bernis Ernst, PA-C   Consent:    Consent obtained:  Verbal   Consent given by:  Patient   Risks, benefits, and alternatives were discussed: yes     Risks discussed:  Infection, pain, nerve damage, need for additional repair, retained foreign body, poor cosmetic result and tendon damage   Alternatives discussed:  No treatment Universal protocol:    Procedure explained and questions answered to patient or proxy's satisfaction: yes     Imaging studies available: yes     Patient identity confirmed:   Verbally with patient Anesthesia:    Anesthesia method:  Topical application and local infiltration   Topical anesthetic:  LET   Local anesthetic:  Lidocaine  1% w/o epi (2) Laceration details:    Location:  Face (Temple/forehead/R cheek)   Face location:  R cheek   Length (cm):  13 Exploration:    Imaging outcome: foreign body not noted     Wound exploration: wound explored through full range of motion and entire depth of wound visualized     Wound extent: no signs of injury and no underlying fracture   Treatment:    Area cleansed with:  Shur-Clens and saline   Amount of cleaning:  Extensive   Irrigation solution:  Sterile saline   Irrigation method:  Pressure wash   Layers/structures repaired:  Deep subcutaneous Deep subcutaneous:    Suture size:  5-0   Suture material:  Vicryl   Suture technique:  Buried horizontal mattress   Number of sutures:  8 Skin repair:    Repair method:  Sutures   Suture size:  5-0   Wound skin closure material used: Vicryl.   Suture technique:  Simple interrupted   Number of sutures:  13 Approximation:    Approximation:  Close Repair type:    Repair type:  Intermediate Post-procedure details:    Dressing:  Antibiotic ointment   Procedure completion:  Tolerated well, no immediate complications    Medications Ordered in the ED  oxyCODONE -acetaminophen  (PERCOCET/ROXICET) 5-325 MG per tablet 2 tablet (2 tablets Oral Given 10/06/23 2135)  lidocaine -EPINEPHrine -tetracaine  (  LET) topical gel (3 mLs Topical Given 10/06/23 2148)      {Click here for ABCD2, HEART and other calculators REFRESH Note before signing:1}                              Medical Decision Making Amount and/or Complexity of Data Reviewed Radiology: ordered.  Risk Prescription drug management.   ***  {Document critical care time when appropriate  Document review of labs and clinical decision tools ie CHADS2VASC2, etc  Document your independent review of radiology images and  any outside records  Document your discussion with family members, caretakers and with consultants  Document social determinants of health affecting pt's care  Document your decision making why or why not admission, treatments were needed:32947:::1}   Final diagnoses:  None    ED Discharge Orders     None

## 2023-10-06 NOTE — ED Triage Notes (Signed)
 Pt reports tripped and fell and struck R side of face on small tree. Pt has large laceration to R side of face.

## 2023-10-07 ENCOUNTER — Other Ambulatory Visit (HOSPITAL_BASED_OUTPATIENT_CLINIC_OR_DEPARTMENT_OTHER): Payer: Self-pay

## 2023-10-07 ENCOUNTER — Encounter: Payer: Self-pay | Admitting: Cardiovascular Disease

## 2023-10-07 DIAGNOSIS — S01411A Laceration without foreign body of right cheek and temporomandibular area, initial encounter: Secondary | ICD-10-CM | POA: Diagnosis not present

## 2023-10-07 MED ORDER — AMOXICILLIN-POT CLAVULANATE 875-125 MG PO TABS
1.0000 | ORAL_TABLET | Freq: Once | ORAL | Status: AC
  Administered 2023-10-07: 1 via ORAL
  Filled 2023-10-07: qty 1

## 2023-10-07 MED ORDER — METHOCARBAMOL 500 MG PO TABS
500.0000 mg | ORAL_TABLET | Freq: Two times a day (BID) | ORAL | 0 refills | Status: AC | PRN
  Filled 2023-10-07 (×2): qty 10, 5d supply, fill #0

## 2023-10-07 MED ORDER — AMOXICILLIN-POT CLAVULANATE 875-125 MG PO TABS
1.0000 | ORAL_TABLET | Freq: Two times a day (BID) | ORAL | 0 refills | Status: DC
  Filled 2023-10-07: qty 14, 7d supply, fill #0

## 2023-10-07 NOTE — Discharge Instructions (Addendum)
 You were seen in the Emergency Department today for evaluation of your laceration. I am glad we were able to repair this for you. Please make sure that you are remembering to keep your wound clean daily with Dial soap and water  and daily bandage changes. I recommend keeping the wound covered for the next 48-72 hours and then to your comfort afterwards. Do not expose the wound to any dishwater, pools, lakes, oceans, [employer], dirt or grime. Keeping the wound clean and away from contamination can help ensure good wound healing and help to prevent infections. Your stitches will come out on their own. This can be down at your primary care office, urgent care, or the ER.  For pain, I recommend Tylenol  1000mg  and/or ibuprofen 600mg  every 6 hours as needed for pain.Please make sure to take your antibiotic as prescribed and complete the entirety of the course. If you have any concerns, new or worsening symptoms, please return to the nearest ER for re-evaluation.   **I have included additional information in to the discharge paperwork for you to review. I am also prescribing you a few muscle relaxers to take as needed.  Please do not drive or operate heavy machinery while on the medication as it will make you sleepy.  Contact a doctor if: You got a tetanus shot and you have any of these problems where the needle went in: Swelling. Very bad pain. Redness. Bleeding. A wound that was closed breaks open. You have a fever. You have any of these signs of infection in your wound: More redness, swelling, or pain. Fluid or blood. Warmth. Pus or a bad smell. You see something coming out of the wound, such as wood or glass. Medicine does not make your pain go away. You notice a change in the color of your skin near your wound. You need to change the bandage often. You have a new rash. You lose feeling (have numbness) around the wound. Get help right away if: You have very bad swelling around the wound. Your  pain suddenly gets worse and is very bad. You have painful lumps near the wound or on skin anywhere on your body. You have a red streak going away from your wound. The wound is on your hand or foot, and: You cannot move a finger or toe. Your fingers or toes look pale or bluish.

## 2023-10-09 ENCOUNTER — Other Ambulatory Visit (HOSPITAL_COMMUNITY): Payer: Self-pay

## 2023-10-09 ENCOUNTER — Other Ambulatory Visit (HOSPITAL_BASED_OUTPATIENT_CLINIC_OR_DEPARTMENT_OTHER): Payer: Self-pay

## 2023-10-21 ENCOUNTER — Other Ambulatory Visit (HOSPITAL_BASED_OUTPATIENT_CLINIC_OR_DEPARTMENT_OTHER): Payer: Self-pay

## 2023-10-24 ENCOUNTER — Other Ambulatory Visit (HOSPITAL_BASED_OUTPATIENT_CLINIC_OR_DEPARTMENT_OTHER): Payer: Self-pay

## 2023-10-25 ENCOUNTER — Other Ambulatory Visit (HOSPITAL_BASED_OUTPATIENT_CLINIC_OR_DEPARTMENT_OTHER): Payer: Self-pay

## 2023-10-25 MED ORDER — OXYCODONE-ACETAMINOPHEN 10-325 MG PO TABS
1.0000 | ORAL_TABLET | Freq: Four times a day (QID) | ORAL | 0 refills | Status: DC | PRN
  Filled 2023-11-07: qty 120, 30d supply, fill #0

## 2023-10-28 ENCOUNTER — Other Ambulatory Visit (HOSPITAL_BASED_OUTPATIENT_CLINIC_OR_DEPARTMENT_OTHER): Payer: Self-pay

## 2023-11-02 ENCOUNTER — Other Ambulatory Visit (HOSPITAL_BASED_OUTPATIENT_CLINIC_OR_DEPARTMENT_OTHER): Payer: Self-pay

## 2023-11-02 ENCOUNTER — Other Ambulatory Visit: Payer: Self-pay

## 2023-11-02 MED ORDER — DULOXETINE HCL 30 MG PO CPEP
30.0000 mg | ORAL_CAPSULE | Freq: Every day | ORAL | 0 refills | Status: DC
  Filled 2023-11-02: qty 90, 90d supply, fill #0

## 2023-11-02 MED ORDER — FLUTICASONE PROPIONATE 50 MCG/ACT NA SUSP
1.0000 | Freq: Every day | NASAL | 3 refills | Status: AC
  Filled 2023-11-02: qty 48, 90d supply, fill #0
  Filled 2024-01-25: qty 48, 90d supply, fill #1

## 2023-11-03 ENCOUNTER — Ambulatory Visit

## 2023-11-03 VITALS — BP 109/71 | HR 66 | Temp 97.9°F | Resp 20 | Ht 63.0 in | Wt 160.8 lb

## 2023-11-03 DIAGNOSIS — E785 Hyperlipidemia, unspecified: Secondary | ICD-10-CM | POA: Diagnosis not present

## 2023-11-03 MED ORDER — INCLISIRAN SODIUM 284 MG/1.5ML ~~LOC~~ SOSY
284.0000 mg | PREFILLED_SYRINGE | Freq: Once | SUBCUTANEOUS | Status: AC
  Administered 2023-11-03: 284 mg via SUBCUTANEOUS
  Filled 2023-11-03: qty 1.5

## 2023-11-03 NOTE — Progress Notes (Signed)
 Diagnosis: Hyperlipidemia  Provider:  Chilton Greathouse MD  Procedure: Injection  Leqvio (inclisiran), Dose: 284 mg, Site: subcutaneous, Number of injections: 1  Injection Site(s): Right arm  Post Care:  right arm injection  Discharge: Condition: Good, Destination: Home . AVS Provided  Performed by:  Rico Ala, LPN

## 2023-11-04 ENCOUNTER — Other Ambulatory Visit (HOSPITAL_COMMUNITY): Payer: Self-pay

## 2023-11-04 ENCOUNTER — Other Ambulatory Visit (HOSPITAL_BASED_OUTPATIENT_CLINIC_OR_DEPARTMENT_OTHER): Payer: Self-pay

## 2023-11-07 ENCOUNTER — Other Ambulatory Visit (HOSPITAL_BASED_OUTPATIENT_CLINIC_OR_DEPARTMENT_OTHER): Payer: Self-pay

## 2023-11-20 ENCOUNTER — Other Ambulatory Visit (HOSPITAL_BASED_OUTPATIENT_CLINIC_OR_DEPARTMENT_OTHER): Payer: Self-pay

## 2023-11-27 ENCOUNTER — Telehealth: Payer: Self-pay | Admitting: Cardiovascular Disease

## 2023-11-27 NOTE — Telephone Encounter (Signed)
 Patient wants to know if she will need to have blood work done prior to her next visit on 12/29.  Patient stated can leave voice message and noted her labs done by PCP in July turned out good.

## 2023-11-27 NOTE — Telephone Encounter (Signed)
 Dr. Raford reviewed labs from 08/2023 done by PCP and stated her cholesterol looked great.  Did not mention any other labs.  Will get labs at visit if needed.  Left message for patient on her voicemail.

## 2023-12-11 ENCOUNTER — Other Ambulatory Visit (HOSPITAL_BASED_OUTPATIENT_CLINIC_OR_DEPARTMENT_OTHER): Payer: Self-pay

## 2023-12-11 MED ORDER — OXYCODONE-ACETAMINOPHEN 10-325 MG PO TABS
1.0000 | ORAL_TABLET | Freq: Four times a day (QID) | ORAL | 0 refills | Status: DC | PRN
  Filled 2023-12-11: qty 120, 30d supply, fill #0

## 2023-12-12 ENCOUNTER — Encounter: Payer: Self-pay | Admitting: Cardiovascular Disease

## 2023-12-12 ENCOUNTER — Other Ambulatory Visit (HOSPITAL_BASED_OUTPATIENT_CLINIC_OR_DEPARTMENT_OTHER): Payer: Self-pay

## 2023-12-12 NOTE — Progress Notes (Unsigned)
 Cardiology Office Note   Date:  12/13/2023  ID:  Jeanette Rodriguez, DOB 28-Feb-1955, MRN [id number] PCP: Verena Mems, MD  [facility] Providers Cardiologist:  None     PMH Dyslipidemia Coronary artery disease CT Calcium score 05/2023 CAC 251 (83rd percentile) LM 0, LAD 251, LCx 0, RCA 0 Left heart cath 06/20/2023 LAD proximal 30%, moderate D1 and small to moderate D2 Mid LAD mildly eccentric and calcific 70% stenosis Medical therapy recommended Consider PCI to mid LAD if med therapy fails Aortic atherosclerosis Statin intolerance Aortic valve calcification  Referred to cardiology and seen by Dr. Raford 06/16/2023 for evaluation of elevated coronary calcium score of 251 with all calcification in LAD.  She reported regular exercise utilizing various machines and engaging in workouts for 1 to 2 hours.  She feels great during exercise but experiences occasional pressure in her chest when moving though not with workouts.  No additional symptoms associated with chest pain.  Diet generally low calorie consisting of yogurt, fruit, and a single meal per day supplemented by protein bars.  Currently on Wegovy  for weight management.  She has lost significant weight from 255 lbs to 148 lbs.  Family history of heart disease with her father dying of a heart attack at 14, her mother having pig valve replacement and passing away at age 45, heart attacks in all grandparents and great grandparents, and her brother with high cholesterol.  Symptoms were concerning for angina and cardiac cath was scheduled.  LDL was at 156 mg/dL with history of statin intolerance, she was advised to start inclisiran.  LHC 06/20/2023 revealed 30% proximal LAD stenosis, and mid LAD calcific 70% stenosis.  Medical therapy was recommended including amlodipine  5 mg daily, metoprolol  25 mg daily, and ezetimibe  10 mg daily.  TTE 07/24/2023 with LVEF 55 to 60%, no RWMA, normal diastolic parameters, mildly enlarged RV, moderate  calcification of AV with no stenosis.    History of Present Illness Discussed the use of AI scribe software for clinical note transcription with the patient, who gave verbal consent to proceed.  History of Present Illness Jeanette Rodriguez is a very pleasant 68 year old female who presents for follow-up of CAD. She experiences persistent body pain, described as an aching sensation in her bones. This has worsened over the past 3 months and she has an upcoming appointment with rheumatology. Despite discontinuing statins, the pain persists. She is on Leqvio  injections every six months for cholesterol management and has completed her first and three-month doses. Her body pain remains unchanged after these injections. Her LDL cholesterol is currently at 61 mg/dL.  She is generally active but has limited activity since a fall that occurred in her yard after tripping over a stake in the yard and striking her head on a tree stump that occurred in August.  She had a head laceration that required 21 stitches and has had some subsequent headaches.  Previous workouts included weight lifting, and cardio exercise 3 days a week. She has not had any chest pain, shortness of breath, palpitations, or other symptoms concerning for angina. She experiences occasional lightheadedness, which she attributes to inadequate water  intake, as she drinks about 32 ounces daily. Her blood pressure today is 120/62. She has not needed to use her nitroglycerin  recently.  She achieved significant weight loss on GLP-1 therapy.   ROS: See HPI  Studies Reviewed      No results found for: LIPOA  Risk Assessment/Calculations  Physical Exam VS:  BP 120/62 (BP Location: Right Arm, Patient Position: Sitting, Cuff Size: Large)   Pulse 63   Ht 5' 2.75 (1.594 m)   Wt 164 lb (74.4 kg)   SpO2 97%   BMI 29.28 kg/m    Wt Readings from Last 3 Encounters:  12/13/23 164 lb (74.4 kg)  11/03/23 160 lb 12.8 oz (72.9 kg)   10/06/23 150 lb (68 kg)    GEN: Well nourished, well developed in no acute distress NECK: No JVD; No carotid bruits CARDIAC: RRR, no murmurs, rubs, gallops RESPIRATORY:  Clear to auscultation without rales, wheezing or rhonchi  ABDOMEN: Soft, non-tender, non-distended EXTREMITIES:  No edema; No deformity    Assessment & Plan Coronary artery disease    She presented with elevated coronary calcium score of 251 in LAD and symptoms concerning for angina and underwent cardiac cath 06/20/2023.  Cath revealed 70% stenosis in the mid-LAD recommended for medical management. She was started on metoprolol  and amlodipine  at that time and is tolerating these medications without concerning side effects. She denies chest pain, dyspnea, or other symptoms concerning for angina.  No indication for further ischemic evaluation at this time.  Lipids and BP are well-controlled. No bleeding concerns on aspirin . She asked about keeping SL NTG on hand, but has never used it. Recommendation that she keep this Rx active.  - Continue GDMT including amlodipine , metoprolol , aspirin , ezetimibe , Leqvio , semaglutide  - Aim for at least 150 minutes of moderate intensity exercise each week - Eat a heart healthy, mostly whole food diet avoiding processed foods, saturated fat, sugar, and other simple carbohydrates   Hyperlipidemia LDL goal < 70 Lipid panel completed by PCP on 09/07/2023 with total cholesterol 154, triglycerides 77, LDL-C 61, and HDL 79.  She is tolerating ezetimibe  and Leqvio  without concerning side effects.  Discussed most aggressive goal would be target LDL 55 or lower.  LDL is very well-controlled at 61 mg/dL.  -Continue ezetimibe  10 mg daily  - Continue Leqvio  injections every 6 months - Continue routine lipid analysis with PCP  Hypertension   BP is well controlled at 120/62. She reports occasional lightheadedness but no presyncope or syncope.  Admits to poor hydration at times.  Renal function stable on labs  completed 09/07/2023.  Well controlled with the current regimen, with blood pressure stable at 120/62 mmHg.  No change in antihypertensive therapy today.   - Recommend routine home BP monitoring - Notify us  if symptoms of dizziness or lightheadedness occur  Mild aortic valve calcification   Reviewed findings from echo that revealed mild calcification of aortic valve, but no stenosis. She is asymptomatic. - Continue to monitor clinically for now         Dispo: 1 year with Dr. Raford or APP  Signed, Rosaline Bane, NP-C

## 2023-12-13 ENCOUNTER — Ambulatory Visit (INDEPENDENT_AMBULATORY_CARE_PROVIDER_SITE_OTHER): Admitting: Nurse Practitioner

## 2023-12-13 ENCOUNTER — Encounter (HOSPITAL_BASED_OUTPATIENT_CLINIC_OR_DEPARTMENT_OTHER): Payer: Self-pay | Admitting: Nurse Practitioner

## 2023-12-13 VITALS — BP 120/62 | HR 63 | Ht 62.75 in | Wt 164.0 lb

## 2023-12-13 DIAGNOSIS — I10 Essential (primary) hypertension: Secondary | ICD-10-CM | POA: Diagnosis not present

## 2023-12-13 DIAGNOSIS — I358 Other nonrheumatic aortic valve disorders: Secondary | ICD-10-CM | POA: Diagnosis not present

## 2023-12-13 DIAGNOSIS — E785 Hyperlipidemia, unspecified: Secondary | ICD-10-CM

## 2023-12-13 DIAGNOSIS — I2510 Atherosclerotic heart disease of native coronary artery without angina pectoris: Secondary | ICD-10-CM | POA: Diagnosis not present

## 2023-12-13 NOTE — Patient Instructions (Signed)
 Medication Instructions:   Your physician recommends that you continue on your current medications as directed. Please refer to the Current Medication list given to you today.   *If you need a refill on your cardiac medications before your next appointment, please call your pharmacy*  Lab Work:  None ordered.  If you have labs (blood work) drawn today and your tests are completely normal, you will receive your results only by: MyChart Message (if you have MyChart) OR A paper copy in the mail If you have any lab test that is abnormal or we need to change your treatment, we will call you to review the results.  Testing/Procedures:  None ordered.  Follow-Up: At [facility], you and your health needs are our priority.  As part of our continuing mission to provide you with exceptional heart care, our providers are all part of one team.  This team includes your primary Cardiologist (physician) and Advanced Practice Providers or APPs (Physician Assistants and Nurse Practitioners) who all work together to provide you with the care you need, when you need it.  Your next appointment:   1 year(s)  Provider:   Annabella Scarce, MD, Rosaline Bane, NP, or Reche Finder, NP    We recommend signing up for the patient portal called MyChart.  Sign up information is provided on this After Visit Summary.  MyChart is used to connect with patients for Virtual Visits (Telemedicine).  Patients are able to view lab/test results, encounter notes, upcoming appointments, etc.  Non-urgent messages can be sent to your provider as well.   To learn more about what you can do with MyChart, go to [email or web address].   Other Instructions  Your physician wants you to follow-up in: 1 year.  You will receive a reminder letter in the mail two months in advance. If you don't receive a letter, please call our office to schedule the follow-up appointment.  Adopting a Healthy Lifestyle.   Weight:  Know what a healthy weight is for you (roughly BMI <25) and aim to maintain this. You can calculate your body mass index on your smart phone. Unfortunately, this is not the most accurate measure of healthy weight, but it is the simplest measurement to use. A more accurate measurement involves body scanning which measures lean muscle, fat tissue and bony density. We do not have this equipment at [facility].    Diet: Aim for 7+ servings of fruits and vegetables daily Limit animal fats in diet for cholesterol and heart health - choose grass fed whenever available Avoid highly processed foods (fast food burgers, tacos, fried chicken, pizza, hot dogs, french fries)  Saturated fat comes in the form of butter, lard, coconut oil, margarine, partially hydrogenated oils, and fat in meat. These increase your risk of cardiovascular disease.  Use healthy plant oils, such as olive, canola, soy, corn, sunflower and peanut.  Whole foods such as fruits, vegetables and whole grains have fiber  Men need > 38 grams of fiber per day Women need > 25 grams of fiber per day  Load up on vegetables and fruits - one-half of your plate: Aim for color and variety, and remember that potatoes dont count. Go for whole grains - one-quarter of your plate: Whole wheat, barley, wheat berries, quinoa, oats, brown rice, and foods made with them. If you want pasta, go with whole wheat pasta. Protein power - one-quarter of your plate: Fish, chicken, beans, and nuts are all healthy, versatile protein sources. Limit red meat. You need  carbohydrates for energy! The type of carbohydrate is more important than the amount. Choose carbohydrates such as vegetables, fruits, whole grains, beans, and nuts in the place of white rice, white pasta, potatoes (baked or fried), macaroni and cheese, cakes, cookies, and donuts.  If youre thirsty, drink water . Coffee and tea are good in moderation, but skip sugary drinks and limit milk and dairy products to  one or two daily servings. Keep sugar intake at 6 teaspoons or 24 grams or LESS       Exercise: Aim for 150 min of moderate intensity exercise weekly for heart health, and weights twice weekly for bone health Stay active - any steps are better than no steps! Aim for 7-9 hours of sleep daily          Mediterranean Diet  Why follow it? Research shows. Those who follow the Mediterranean diet have a reduced risk of heart disease  The diet is associated with a reduced incidence of Parkinson's and Alzheimer's diseases People following the diet may have longer life expectancies and lower rates of chronic diseases  The Dietary Guidelines for Americans recommends the Mediterranean diet as an eating plan to promote health and prevent disease  What Is the Mediterranean Diet?  Healthy eating plan based on typical foods and recipes of Mediterranean-style cooking The diet is primarily a plant based diet; these foods should make up a majority of meals   Starches - Plant based foods should make up a majority of meals - They are an important sources of vitamins, minerals, energy, antioxidants, and fiber - Choose whole grains, foods high in fiber and minimally processed items  - Typical grain sources include wheat, oats, barley, corn, brown rice, bulgar, farro, millet, polenta, couscous  - Various types of beans include chickpeas, lentils, fava beans, black beans, white beans   Fruits  Veggies - Large quantities of antioxidant rich fruits & veggies; 6 or more servings  - Vegetables can be eaten raw or lightly drizzled with oil and cooked  - Vegetables common to the traditional Mediterranean Diet include: artichokes, arugula, beets, broccoli, brussel sprouts, cabbage, carrots, celery, collard greens, cucumbers, eggplant, kale, leeks, lemons, lettuce, mushrooms, okra, onions, peas, peppers, potatoes, pumpkin, radishes, rutabaga, shallots, spinach, sweet potatoes, turnips, zucchini - Fruits common to the  Mediterranean Diet include: apples, apricots, avocados, cherries, clementines, dates, figs, grapefruits, grapes, melons, nectarines, oranges, peaches, pears, pomegranates, strawberries, tangerines  Fats - Replace butter and margarine with healthy oils, such as olive oil, canola oil, and tahini  - Limit nuts to no more than a handful a day  - Nuts include walnuts, almonds, pecans, pistachios, pine nuts  - Limit or avoid candied, honey roasted or heavily salted nuts - Olives are central to the [employer] - can be eaten whole or used in a variety of dishes   Meats Protein - Limiting red meat: no more than a few times a month - When eating red meat: choose lean cuts and keep the portion to the size of deck of cards - Eggs: approx. 0 to 4 times a week  - Fish and lean poultry: at least 2 a week  - Healthy protein sources include, chicken, [place], lean beef, lamb - Increase intake of seafood such as tuna, salmon, trout, mackerel, shrimp, scallops - Avoid or limit high fat processed meats such as sausage and bacon  Dairy - Include moderate amounts of low fat dairy products  - Focus on healthy dairy such as fat free yogurt, skim  milk, low or reduced fat cheese - Limit dairy products higher in fat such as whole or 2% milk, cheese, ice cream  Alcohol - Moderate amounts of red wine is ok  - No more than 5 oz daily for women (all ages) and men older than age 5  - No more than 10 oz of wine daily for men younger than 34  Other - Limit sweets and other desserts  - Use herbs and spices instead of salt to flavor foods  - Herbs and spices common to the traditional Mediterranean Diet include: basil, bay leaves, chives, cloves, cumin, fennel, garlic, lavender, marjoram, mint, oregano, parsley, pepper, rosemary, sage, savory, sumac, tarragon, thyme   It's not just a diet, it's a lifestyle:  The Mediterranean diet includes lifestyle factors typical of those in the region  Foods, drinks and meals are  best eaten with others and savored Daily physical activity is important for overall good health This could be strenuous exercise like running and aerobics This could also be more leisurely activities such as walking, housework, yard-work, or taking the stairs Moderation is the key; a balanced and healthy diet accommodates most foods and drinks Consider portion sizes and frequency of consumption of certain foods   Meal Ideas & Options:  Breakfast:  Whole wheat toast or whole wheat English muffins with peanut butter & hard boiled egg Steel cut oats topped with apples & cinnamon and skim milk  Fresh fruit: banana, strawberries, melon, berries, peaches  Smoothies: strawberries, bananas, greek yogurt, peanut butter Low fat greek yogurt with blueberries and granola  Egg white omelet with spinach and mushrooms Breakfast couscous: whole wheat couscous, apricots, skim milk, cranberries  Sandwiches:  Hummus and grilled vegetables (peppers, zucchini, squash) on whole wheat bread   Grilled chicken on whole wheat pita with lettuce, tomatoes, cucumbers or tzatziki  [place] salad on whole wheat bread: tuna salad made with greek yogurt, olives, red peppers, capers, green onions Garlic rosemary lamb pita: lamb sauted with garlic, rosemary, salt & pepper; add lettuce, cucumber, greek yogurt to pita - flavor with lemon juice and black pepper  Seafood:  Mediterranean grilled salmon, seasoned with garlic, basil, parsley, lemon juice and black pepper Shrimp, lemon, and spinach whole-grain pasta salad made with low fat greek yogurt  Seared scallops with lemon orzo  Seared tuna steaks seasoned salt, pepper, coriander topped with tomato mixture of olives, tomatoes, olive oil, minced garlic, parsley, green onions and cappers  Meats:  Herbed greek chicken salad with kalamata olives, cucumber, feta  Red bell peppers stuffed with spinach, bulgur, lean ground beef (or lentils) & topped with feta   Kebabs: skewers of  chicken, tomatoes, onions, zucchini, squash  [place] burgers: made with red onions, mint, dill, lemon juice, feta cheese topped with roasted red peppers Vegetarian Cucumber salad: cucumbers, artichoke hearts, celery, red onion, feta cheese, tossed in olive oil & lemon juice  Hummus and whole grain pita points with a greek salad (lettuce, tomato, feta, olives, cucumbers, red onion) Lentil soup with celery, carrots made with vegetable broth, garlic, salt and pepper  Tabouli salad: parsley, bulgur, mint, scallions, cucumbers, tomato, radishes, lemon juice, olive oil, salt and pepper.

## 2024-01-02 ENCOUNTER — Other Ambulatory Visit (HOSPITAL_BASED_OUTPATIENT_CLINIC_OR_DEPARTMENT_OTHER): Payer: Self-pay

## 2024-01-03 ENCOUNTER — Other Ambulatory Visit (HOSPITAL_BASED_OUTPATIENT_CLINIC_OR_DEPARTMENT_OTHER): Payer: Self-pay

## 2024-01-08 ENCOUNTER — Other Ambulatory Visit (HOSPITAL_BASED_OUTPATIENT_CLINIC_OR_DEPARTMENT_OTHER): Payer: Self-pay

## 2024-01-09 ENCOUNTER — Other Ambulatory Visit (HOSPITAL_BASED_OUTPATIENT_CLINIC_OR_DEPARTMENT_OTHER): Payer: Self-pay

## 2024-01-10 ENCOUNTER — Other Ambulatory Visit (HOSPITAL_BASED_OUTPATIENT_CLINIC_OR_DEPARTMENT_OTHER): Payer: Self-pay

## 2024-01-11 ENCOUNTER — Other Ambulatory Visit: Payer: Self-pay

## 2024-01-11 ENCOUNTER — Other Ambulatory Visit (HOSPITAL_BASED_OUTPATIENT_CLINIC_OR_DEPARTMENT_OTHER): Payer: Self-pay

## 2024-01-11 MED ORDER — OXYCODONE-ACETAMINOPHEN 10-325 MG PO TABS
1.0000 | ORAL_TABLET | Freq: Four times a day (QID) | ORAL | 0 refills | Status: DC | PRN
  Filled 2024-01-11: qty 120, 30d supply, fill #0

## 2024-01-25 ENCOUNTER — Other Ambulatory Visit: Payer: Self-pay | Admitting: Cardiovascular Disease

## 2024-01-25 ENCOUNTER — Other Ambulatory Visit (HOSPITAL_BASED_OUTPATIENT_CLINIC_OR_DEPARTMENT_OTHER): Payer: Self-pay

## 2024-01-26 ENCOUNTER — Other Ambulatory Visit (HOSPITAL_BASED_OUTPATIENT_CLINIC_OR_DEPARTMENT_OTHER): Payer: Self-pay

## 2024-01-26 MED ORDER — DULOXETINE HCL 30 MG PO CPEP
30.0000 mg | ORAL_CAPSULE | Freq: Every day | ORAL | 0 refills | Status: AC
  Filled 2024-01-26: qty 90, 90d supply, fill #0

## 2024-01-27 ENCOUNTER — Other Ambulatory Visit (HOSPITAL_BASED_OUTPATIENT_CLINIC_OR_DEPARTMENT_OTHER): Payer: Self-pay

## 2024-01-30 ENCOUNTER — Other Ambulatory Visit: Payer: Self-pay | Admitting: Cardiovascular Disease

## 2024-01-30 ENCOUNTER — Other Ambulatory Visit (HOSPITAL_BASED_OUTPATIENT_CLINIC_OR_DEPARTMENT_OTHER): Payer: Self-pay

## 2024-01-31 ENCOUNTER — Other Ambulatory Visit (HOSPITAL_BASED_OUTPATIENT_CLINIC_OR_DEPARTMENT_OTHER): Payer: Self-pay

## 2024-01-31 MED ORDER — METOPROLOL SUCCINATE ER 25 MG PO TB24
25.0000 mg | ORAL_TABLET | Freq: Every day | ORAL | 2 refills | Status: AC
  Filled 2024-01-31: qty 90, 90d supply, fill #0

## 2024-02-12 ENCOUNTER — Other Ambulatory Visit (HOSPITAL_BASED_OUTPATIENT_CLINIC_OR_DEPARTMENT_OTHER): Payer: Self-pay

## 2024-02-12 ENCOUNTER — Other Ambulatory Visit: Payer: Self-pay

## 2024-02-12 ENCOUNTER — Emergency Department (HOSPITAL_BASED_OUTPATIENT_CLINIC_OR_DEPARTMENT_OTHER)

## 2024-02-12 ENCOUNTER — Ambulatory Visit (HOSPITAL_BASED_OUTPATIENT_CLINIC_OR_DEPARTMENT_OTHER)
Admission: EM | Admit: 2024-02-12 | Discharge: 2024-02-13 | Disposition: A | Discharge status: Home / Self Care | Attending: Emergency Medicine | Admitting: Emergency Medicine

## 2024-02-12 ENCOUNTER — Encounter (HOSPITAL_BASED_OUTPATIENT_CLINIC_OR_DEPARTMENT_OTHER): Payer: Self-pay | Admitting: *Deleted

## 2024-02-12 ENCOUNTER — Emergency Department (HOSPITAL_BASED_OUTPATIENT_CLINIC_OR_DEPARTMENT_OTHER): Admitting: Radiology

## 2024-02-12 DIAGNOSIS — I2510 Atherosclerotic heart disease of native coronary artery without angina pectoris: Secondary | ICD-10-CM | POA: Insufficient documentation

## 2024-02-12 DIAGNOSIS — J4489 Other specified chronic obstructive pulmonary disease: Secondary | ICD-10-CM | POA: Diagnosis not present

## 2024-02-12 DIAGNOSIS — M1990 Unspecified osteoarthritis, unspecified site: Secondary | ICD-10-CM | POA: Diagnosis not present

## 2024-02-12 DIAGNOSIS — E785 Hyperlipidemia, unspecified: Secondary | ICD-10-CM | POA: Diagnosis not present

## 2024-02-12 DIAGNOSIS — K3533 Acute appendicitis with perforation and localized peritonitis, with abscess: Secondary | ICD-10-CM | POA: Diagnosis not present

## 2024-02-12 DIAGNOSIS — K3580 Unspecified acute appendicitis: Secondary | ICD-10-CM | POA: Diagnosis present

## 2024-02-12 DIAGNOSIS — I10 Essential (primary) hypertension: Secondary | ICD-10-CM | POA: Insufficient documentation

## 2024-02-12 DIAGNOSIS — Z87891 Personal history of nicotine dependence: Secondary | ICD-10-CM | POA: Diagnosis not present

## 2024-02-12 DIAGNOSIS — Z79899 Other long term (current) drug therapy: Secondary | ICD-10-CM | POA: Diagnosis not present

## 2024-02-12 LAB — URINALYSIS, ROUTINE W REFLEX MICROSCOPIC
Bilirubin Urine: NEGATIVE
Glucose, UA: NEGATIVE mg/dL
Hgb urine dipstick: NEGATIVE
Ketones, ur: >80 mg/dL — AB
Nitrite: NEGATIVE
Protein, ur: 30 mg/dL — AB
Specific Gravity, Urine: 1.024 (ref 1.005–1.030)
pH: 8.5 — ABNORMAL HIGH (ref 5.0–8.0)

## 2024-02-12 LAB — COMPREHENSIVE METABOLIC PANEL WITH GFR
ALT: 22 U/L (ref 0–44)
AST: 33 U/L (ref 15–41)
Albumin: 5.1 g/dL — ABNORMAL HIGH (ref 3.5–5.0)
Alkaline Phosphatase: 77 U/L (ref 38–126)
Anion gap: 20 — ABNORMAL HIGH (ref 5–15)
BUN: 15 mg/dL (ref 8–23)
CO2: 20 mmol/L — ABNORMAL LOW (ref 22–32)
Calcium: 10.9 mg/dL — ABNORMAL HIGH (ref 8.9–10.3)
Chloride: 101 mmol/L (ref 98–111)
Creatinine, Ser: 0.78 mg/dL (ref 0.44–1.00)
GFR, Estimated: >60 mL/min
Glucose, Bld: 120 mg/dL — ABNORMAL HIGH (ref 70–99)
Potassium: 3.9 mmol/L (ref 3.5–5.1)
Sodium: 141 mmol/L (ref 135–145)
Total Bilirubin: 0.8 mg/dL (ref 0.0–1.2)
Total Protein: 8.2 g/dL — ABNORMAL HIGH (ref 6.5–8.1)

## 2024-02-12 LAB — CBC
HCT: 42.6 % (ref 36.0–46.0)
Hemoglobin: 14.7 g/dL (ref 12.0–15.0)
MCH: 29.8 pg (ref 26.0–34.0)
MCHC: 34.5 g/dL (ref 30.0–36.0)
MCV: 86.4 fL (ref 80.0–100.0)
Platelets: 482 K/uL — ABNORMAL HIGH (ref 150–400)
RBC: 4.93 MIL/uL (ref 3.87–5.11)
RDW: 13.3 % (ref 11.5–15.5)
WBC: 13.3 K/uL — ABNORMAL HIGH (ref 4.0–10.5)
nRBC: 0 % (ref 0.0–0.2)

## 2024-02-12 LAB — LIPASE, BLOOD: Lipase: 35 U/L (ref 11–51)

## 2024-02-12 LAB — TROPONIN T, HIGH SENSITIVITY: Troponin T High Sensitivity: <15 ng/L (ref 0–19)

## 2024-02-12 MED ORDER — ONDANSETRON HCL 4 MG/2ML IJ SOLN
4.0000 mg | Freq: Once | INTRAMUSCULAR | Status: AC | PRN
  Administered 2024-02-12: 4 mg via INTRAVENOUS
  Filled 2024-02-12: qty 2

## 2024-02-12 MED ORDER — ONDANSETRON HCL 4 MG/2ML IJ SOLN
4.0000 mg | Freq: Once | INTRAMUSCULAR | Status: DC
  Filled 2024-02-12: qty 2

## 2024-02-12 MED ORDER — MORPHINE SULFATE (PF) 4 MG/ML IV SOLN
4.0000 mg | Freq: Once | INTRAVENOUS | Status: AC
  Administered 2024-02-13: 4 mg via INTRAVENOUS
  Filled 2024-02-12: qty 1

## 2024-02-12 MED ORDER — ONDANSETRON 4 MG PO TBDP: 4.0000 mg | ORAL_TABLET | Freq: Once | ORAL | Status: DC | PRN

## 2024-02-12 MED ORDER — SODIUM CHLORIDE 0.9 % IV BOLUS
1000.0000 mL | Freq: Once | INTRAVENOUS | Status: AC
  Administered 2024-02-13: 1000 mL via INTRAVENOUS

## 2024-02-12 NOTE — ED Provider Notes (Signed)
 " [place] [facility] AT [facility] Provider Note   CSN: [id number] Arrival date & time: 02/12/24  8048     Patient presents with: Abdominal Pain   Jeanette Rodriguez is a 68 y.o. female.  {Add pertinent medical, surgical, social history, OB history to HPI:32947} Patient is a 68 year old female with history of coronary artery disease, hyperlipidemia, osteoarthritis, hypertension.  Patient presenting today with complaints of abdominal pain.  She started with abdominal cramping along with nausea, vomiting, diarrhea at approximately 4:00 this evening.  She denies any bloody stool.  No fevers or chills.       Prior to Admission medications  Medication Sig Start Date End Date Taking? Authorizing Provider  albuterol  (PROVENTIL ) (2.5 MG/3ML) 0.083% nebulizer solution Take 2.5 mg by nebulization every 6 (six) hours as needed for wheezing or shortness of breath.    [provider]  amLODipine  (NORVASC ) 5 MG tablet Take 1 tablet (5 mg total) by mouth daily. 07/26/23 05/15/24  Raford Riggs, MD  aspirin  EC 81 MG tablet Take 81 mg by mouth daily. Swallow whole.    [provider]  cetirizine  (ZYRTEC ) 10 MG tablet Take 1 tablet (10 mg total) by mouth daily. 03/09/23     ciprofloxacin  (CILOXAN ) 0.3 % ophthalmic solution Place 1-2 drops into the affected eye(s) 4 (four) times daily. 09/07/23     clotrimazole-betamethasone (LOTRISONE) cream Apply 1 Application topically daily as needed (Rash).    [provider]  diazepam  (VALIUM ) 5 MG tablet TAKE 1 TABLET NIGHT BEFORE AND MORNING OF PROCEDURE 06/16/23   Raford Riggs, MD  diclofenac  Sodium (VOLTAREN ) 1 % GEL APPLY 2 GRAMS TO THE AFFECTED AREA(S) BY TOPICAL ROUTE 4 TIMES PER DAY Patient taking differently: Apply 2 g topically daily as needed (Pain). 12/29/22     DULoxetine  (CYMBALTA ) 30 MG capsule Take 1 capsule (30 mg total) by mouth daily. 01/26/24     DULoxetine  (CYMBALTA ) 60 MG capsule Take 1 capsule (60 mg  total) by mouth daily. 01/12/23     ezetimibe  (ZETIA ) 10 MG tablet Take 1 tablet (10 mg total) by mouth daily. 07/26/23 07/25/24  Raford Riggs, MD  fluticasone  (FLONASE ) 50 MCG/ACT nasal spray Place 1 spray into both nostrils daily. 11/02/23     inclisiran (LEQVIO ) 284 MG/1.5ML SOSY injection Inject 284 mg into the skin as directed. Every 6 months    [provider]  Ketotifen Fumarate (ALLERGY EYE DROPS OP) Place 1 drop into both eyes daily.    [provider]  Magnesium 100 MG TABS Take 100 mg by mouth at bedtime.    [provider]  methocarbamol  (ROBAXIN ) 500 MG tablet Take 1 tablet (500 mg total) by mouth 2 (two) times daily as needed. 10/07/23   Bernis Ernst, PA-C  metoprolol  succinate (TOPROL -XL) 25 MG 24 hr tablet Take 1 tablet (25 mg total) by mouth daily. Take with or immediately following a meal. 01/31/24   Raford Riggs, MD  montelukast  (SINGULAIR ) 10 MG tablet Take 1 tablet (10 mg total) by mouth daily. 10/10/22     Multiple Vitamins-Minerals (MULTIVITAMIN WITH MINERALS) tablet Take 1 tablet by mouth daily.    [provider]  nitroGLYCERIN  (NITROSTAT ) 0.4 MG SL tablet Place 1 tablet (0.4 mg total) under the tongue every 5 (five) minutes as needed for chest pain. 06/20/23 06/19/24  Ladona Heinz, MD  oxyCODONE -acetaminophen  (PERCOCET) 10-325 MG tablet Take 1 tablet by mouth 4 (four) times daily as needed. 01/11/24     semaglutide -weight management (WEGOVY ) 2.4  MG/0.75ML SOAJ SQ injection Inject 2.4 mg into the skin once a week. 09/18/23       Allergies: Atorvastatin, Levofloxacin, Pitavastatin, and Statins    Review of Systems  All other systems reviewed and are negative.   Updated Vital Signs BP (!) 163/96   Pulse 98   Temp (!) 97.4 F (36.3 C) (Oral)   Resp (!) 28   SpO2 100%   Physical Exam Vitals and nursing note reviewed.  Constitutional:      General: She is not in acute distress.    Appearance: She is well-developed. She is not  diaphoretic.  HENT:     Head: Normocephalic and atraumatic.  Cardiovascular:     Rate and Rhythm: Normal rate and regular rhythm.     Heart sounds: No murmur heard.    No friction rub. No gallop.  Pulmonary:     Effort: Pulmonary effort is normal. No respiratory distress.     Breath sounds: Normal breath sounds. No wheezing.  Abdominal:     General: Bowel sounds are normal. There is no distension.     Palpations: Abdomen is soft.     Tenderness: There is generalized abdominal tenderness. There is no right CVA tenderness, left CVA tenderness, guarding or rebound.  Musculoskeletal:        General: Normal range of motion.     Cervical back: Normal range of motion and neck supple.  Skin:    General: Skin is warm and dry.  Neurological:     General: No focal deficit present.     Mental Status: She is alert and oriented to person, place, and time.     (all labs ordered are listed, but only abnormal results are displayed) Labs Reviewed  COMPREHENSIVE METABOLIC PANEL WITH GFR - Abnormal; Notable for the following components:      Result Value   CO2 20 (*)    Glucose, Bld 120 (*)    Calcium 10.9 (*)    Total Protein 8.2 (*)    Albumin  5.1 (*)    Anion gap 20 (*)    All other components within normal limits  CBC - Abnormal; Notable for the following components:   WBC 13.3 (*)    Platelets 482 (*)    All other components within normal limits  URINALYSIS, ROUTINE W REFLEX MICROSCOPIC - Abnormal; Notable for the following components:   pH 8.5 (*)    Ketones, ur >80 (*)    Protein, ur 30 (*)    Leukocytes,Ua SMALL (*)    Bacteria, UA RARE (*)    All other components within normal limits  LIPASE, BLOOD  TROPONIN T, HIGH SENSITIVITY    EKG: EKG Interpretation Date/Time:  Monday February 12 2024 23:12:47 EST Ventricular Rate:  93 PR Interval:  146 QRS Duration:  86 QT Interval:  388 QTC Calculation: 482 R Axis:   53  Text Interpretation: Normal sinus rhythm Nonspecific ST  abnormality Abnormal ECG When compared with ECG of 12-Feb-2024 19:58, No significant change was found diffuse ST st depression Confirmed by Armenta Canning [phone]) on 02/12/2024 11:30:22 PM  Radiology: No results found.  {Document cardiac monitor, telemetry assessment procedure when appropriate:32947} Procedures   Medications Ordered in the ED  ondansetron  (ZOFRAN ) injection 4 mg (has no administration in time range)  morphine  (PF) 4 MG/ML injection 4 mg (has no administration in time range)  sodium chloride  0.9 % bolus 1,000 mL (has no administration in time range)  ondansetron  (ZOFRAN ) injection 4 mg (4  mg Intravenous Given 02/12/24 2023)  ondansetron  (ZOFRAN ) injection 4 mg (4 mg Intravenous Given 02/12/24 2223)      {Click here for ABCD2, HEART and other calculators REFRESH Note before signing:1}                              Medical Decision Making Amount and/or Complexity of Data Reviewed Labs: ordered. Radiology: ordered.  Risk Prescription drug management.   ***  {Document critical care time when appropriate  Document review of labs and clinical decision tools ie CHADS2VASC2, etc  Document your independent review of radiology images and any outside records  Document your discussion with family members, caretakers and with consultants  Document social determinants of health affecting pt's care  Document your decision making why or why not admission, treatments were needed:32947:::1}   Final diagnoses:  None    ED Discharge Orders     None        "

## 2024-02-12 NOTE — ED Notes (Addendum)
 Called out to lobby for patient reassessment, visitor says pt was doing better and then everything ramped up again. pt says that her pain now goes up through her chest. Brought back to triage for reassessment. Pt is taking shallow breaths, hands are cramping, she feels dizzy, and sob. Encouraged slow deep breathing. Vitals reassessed. Added additional labs, lab notifed

## 2024-02-12 NOTE — ED Triage Notes (Signed)
 Pt arrives with c/o abdominal pain, nausea and vomiting since around 1600 today. Endorses around 4 episodes. Denies fevers. Has been taking Pedialyte, took Zofran , but vomited shortly after. Denies any recent sick contact, radiating pain,  or previous abdominal surgery. Hyperventilating on arrival, encouraged slow deep breathing with improvement.   Signature pad not available, pt verbalized understanding of MSE waiver.

## 2024-02-12 NOTE — ED Notes (Signed)
 Ok to redose zofran  per brittni, [place]

## 2024-02-13 ENCOUNTER — Emergency Department (HOSPITAL_COMMUNITY)

## 2024-02-13 ENCOUNTER — Encounter (HOSPITAL_COMMUNITY): Payer: Self-pay

## 2024-02-13 ENCOUNTER — Encounter (HOSPITAL_COMMUNITY)
Admission: EM | Disposition: A | Discharge status: Home / Self Care | Payer: Self-pay | Source: Home / Self Care | Attending: Emergency Medicine

## 2024-02-13 ENCOUNTER — Other Ambulatory Visit (HOSPITAL_BASED_OUTPATIENT_CLINIC_OR_DEPARTMENT_OTHER): Payer: Self-pay

## 2024-02-13 ENCOUNTER — Emergency Department (HOSPITAL_BASED_OUTPATIENT_CLINIC_OR_DEPARTMENT_OTHER)

## 2024-02-13 DIAGNOSIS — I10 Essential (primary) hypertension: Secondary | ICD-10-CM

## 2024-02-13 DIAGNOSIS — I2510 Atherosclerotic heart disease of native coronary artery without angina pectoris: Secondary | ICD-10-CM | POA: Diagnosis not present

## 2024-02-13 DIAGNOSIS — Z743 Need for continuous supervision: Secondary | ICD-10-CM | POA: Diagnosis not present

## 2024-02-13 DIAGNOSIS — K3580 Unspecified acute appendicitis: Secondary | ICD-10-CM | POA: Diagnosis present

## 2024-02-13 DIAGNOSIS — K3533 Acute appendicitis with perforation and localized peritonitis, with abscess: Secondary | ICD-10-CM | POA: Diagnosis not present

## 2024-02-13 DIAGNOSIS — R1084 Generalized abdominal pain: Secondary | ICD-10-CM | POA: Diagnosis not present

## 2024-02-13 DIAGNOSIS — Z87891 Personal history of nicotine dependence: Secondary | ICD-10-CM | POA: Diagnosis not present

## 2024-02-13 HISTORY — PX: LAPAROSCOPIC APPENDECTOMY: SHX408

## 2024-02-13 SURGERY — APPENDECTOMY, LAPAROSCOPIC
Anesthesia: General | Site: Abdomen

## 2024-02-13 MED ORDER — HEMOSTATIC AGENTS (NO CHARGE) OPTIME
TOPICAL | Status: DC | PRN
  Administered 2024-02-13: 1 via TOPICAL

## 2024-02-13 MED ORDER — ORAL CARE MOUTH RINSE: 15.0000 mL | Freq: Once | OROMUCOSAL | Status: AC

## 2024-02-13 MED ORDER — LIDOCAINE 2% (20 MG/ML) 5 ML SYRINGE
INTRAMUSCULAR | Status: DC | PRN
  Administered 2024-02-13: 100 mg via INTRAVENOUS

## 2024-02-13 MED ORDER — ALBUMIN HUMAN 5 % IV SOLN: INTRAVENOUS | Status: DC | PRN

## 2024-02-13 MED ORDER — BUPIVACAINE-EPINEPHRINE (PF) 0.25% -1:200000 IJ SOLN
INTRAMUSCULAR | Status: AC
  Filled 2024-02-13: qty 30

## 2024-02-13 MED ORDER — IOHEXOL 300 MG/ML  SOLN
100.0000 mL | Freq: Once | INTRAMUSCULAR | Status: AC | PRN
  Administered 2024-02-13: 100 mL via INTRAVENOUS

## 2024-02-13 MED ORDER — DEXMEDETOMIDINE HCL IN NACL 80 MCG/20ML IV SOLN
INTRAVENOUS | Status: DC | PRN
  Administered 2024-02-13: 8 ug via INTRAVENOUS

## 2024-02-13 MED ORDER — ONDANSETRON HCL 4 MG/2ML IJ SOLN
INTRAMUSCULAR | Status: DC | PRN
  Administered 2024-02-13: 4 mg via INTRAVENOUS

## 2024-02-13 MED ORDER — OXYCODONE-ACETAMINOPHEN 10-325 MG PO TABS
1.0000 | ORAL_TABLET | Freq: Four times a day (QID) | ORAL | 0 refills | Status: DC | PRN
  Filled 2024-02-13: qty 120, 30d supply, fill #0

## 2024-02-13 MED ORDER — ENOXAPARIN SODIUM 40 MG/0.4ML IJ SOSY
40.0000 mg | PREFILLED_SYRINGE | Freq: Once | INTRAMUSCULAR | Status: AC
  Administered 2024-02-13: 40 mg via SUBCUTANEOUS
  Filled 2024-02-13: qty 0.4

## 2024-02-13 MED ORDER — CHLORHEXIDINE GLUCONATE 0.12 % MT SOLN
15.0000 mL | Freq: Once | OROMUCOSAL | Status: AC
  Administered 2024-02-13: 15 mL via OROMUCOSAL

## 2024-02-13 MED ORDER — ROCURONIUM BROMIDE 10 MG/ML (PF) SYRINGE
PREFILLED_SYRINGE | INTRAVENOUS | Status: DC | PRN
  Administered 2024-02-13: 60 mg via INTRAVENOUS

## 2024-02-13 MED ORDER — BUPIVACAINE-EPINEPHRINE 0.25% -1:200000 IJ SOLN
INTRAMUSCULAR | Status: DC | PRN
  Administered 2024-02-13: 30 mL

## 2024-02-13 MED ORDER — FENTANYL CITRATE (PF) 100 MCG/2ML IJ SOLN
INTRAMUSCULAR | Status: AC
  Filled 2024-02-13: qty 2

## 2024-02-13 MED ORDER — MIDAZOLAM HCL (PF) 2 MG/2ML IJ SOLN
INTRAMUSCULAR | Status: DC | PRN
  Administered 2024-02-13: 2 mg via INTRAVENOUS

## 2024-02-13 MED ORDER — DEXAMETHASONE SOD PHOSPHATE PF 10 MG/ML IJ SOLN
INTRAMUSCULAR | Status: DC | PRN
  Administered 2024-02-13: 4 mg via INTRAVENOUS

## 2024-02-13 MED ORDER — SUCCINYLCHOLINE CHLORIDE 200 MG/10ML IV SOSY
PREFILLED_SYRINGE | INTRAVENOUS | Status: DC | PRN
  Administered 2024-02-13: 120 mg via INTRAVENOUS

## 2024-02-13 MED ORDER — MORPHINE SULFATE (PF) 4 MG/ML IV SOLN
4.0000 mg | Freq: Once | INTRAVENOUS | Status: AC
  Administered 2024-02-13: 4 mg via INTRAVENOUS
  Filled 2024-02-13: qty 1

## 2024-02-13 MED ORDER — MIDAZOLAM HCL 2 MG/2ML IJ SOLN
INTRAMUSCULAR | Status: AC
  Filled 2024-02-13: qty 2

## 2024-02-13 MED ORDER — SODIUM CHLORIDE 0.9 % IV SOLN
2.0000 g | Freq: Once | INTRAVENOUS | Status: AC
  Administered 2024-02-13: 2 g via INTRAVENOUS
  Filled 2024-02-13: qty 20

## 2024-02-13 MED ORDER — PROPOFOL 10 MG/ML IV BOLUS
INTRAVENOUS | Status: DC | PRN
  Administered 2024-02-13: 200 mg via INTRAVENOUS

## 2024-02-13 MED ORDER — 0.9 % SODIUM CHLORIDE (POUR BTL) OPTIME
TOPICAL | Status: DC | PRN
  Administered 2024-02-13: 1000 mL

## 2024-02-13 MED ORDER — SODIUM CHLORIDE 0.9 % IV SOLN: 1.0000 g | Freq: Once | INTRAVENOUS | Status: DC

## 2024-02-13 MED ORDER — LACTATED RINGERS IV SOLN: INTRAVENOUS | Status: DC

## 2024-02-13 MED ORDER — SODIUM CHLORIDE 0.9 % IR SOLN: Status: DC | PRN

## 2024-02-13 MED ORDER — SODIUM CHLORIDE 0.9 % IV SOLN: INTRAVENOUS | Status: DC

## 2024-02-13 MED ORDER — FENTANYL CITRATE (PF) 250 MCG/5ML IJ SOLN
INTRAMUSCULAR | Status: DC | PRN
  Administered 2024-02-13 (×2): 50 ug via INTRAVENOUS

## 2024-02-13 MED ORDER — PROPOFOL 10 MG/ML IV BOLUS
INTRAVENOUS | Status: AC
  Filled 2024-02-13: qty 20

## 2024-02-13 MED ORDER — SUGAMMADEX SODIUM 200 MG/2ML IV SOLN
INTRAVENOUS | Status: DC | PRN
  Administered 2024-02-13: 200 mg via INTRAVENOUS

## 2024-02-13 MED ORDER — PHENYLEPHRINE 80 MCG/ML (10ML) SYRINGE FOR IV PUSH (FOR BLOOD PRESSURE SUPPORT)
PREFILLED_SYRINGE | INTRAVENOUS | Status: DC | PRN
  Administered 2024-02-13: 240 ug via INTRAVENOUS
  Administered 2024-02-13: 160 ug via INTRAVENOUS

## 2024-02-13 MED ORDER — METRONIDAZOLE 500 MG/100ML IV SOLN
500.0000 mg | Freq: Once | INTRAVENOUS | Status: AC
  Administered 2024-02-13: 500 mg via INTRAVENOUS
  Filled 2024-02-13: qty 100

## 2024-02-13 SURGICAL SUPPLY — 42 items
BAG COUNTER SPONGE SURGICOUNT (BAG) ×1 IMPLANT
BLADE CLIPPER SURG (BLADE) IMPLANT
CHLORAPREP W/TINT 26 (MISCELLANEOUS) ×1 IMPLANT
CNTNR URN SCR LID CUP LEK RST (MISCELLANEOUS) IMPLANT
COVER SURGICAL LIGHT HANDLE (MISCELLANEOUS) ×1 IMPLANT
DERMABOND ADVANCED .7 DNX12 (GAUZE/BANDAGES/DRESSINGS) ×1 IMPLANT
ELECTRODE REM PT RTRN 9FT ADLT (ELECTROSURGICAL) ×1 IMPLANT
GLOVE BIOGEL PI MICRO STRL 6 (GLOVE) ×1 IMPLANT
GLOVE INDICATOR 6.5 STRL GRN (GLOVE) ×1 IMPLANT
GOWN STRL REUS W/ TWL LRG LVL3 (GOWN DISPOSABLE) ×3 IMPLANT
GRASPER SUT TROCAR 14GX15 (MISCELLANEOUS) ×1 IMPLANT
IRRIGATION SUCT STRKRFLW 2 WTP (MISCELLANEOUS) ×1 IMPLANT
KIT BASIN OR (CUSTOM PROCEDURE TRAY) ×1 IMPLANT
KIT TURNOVER KIT B (KITS) ×1 IMPLANT
LIGASURE LAP MARYLAND 5MM 37CM (ELECTROSURGICAL) ×1 IMPLANT
MANIFOLD NEPTUNE II (INSTRUMENTS) ×1 IMPLANT
NDL 22X1.5 STRL (OR ONLY) (MISCELLANEOUS) ×1 IMPLANT
NDL INSUFFLATION 14GA 120MM (NEEDLE) ×1 IMPLANT
NEEDLE 22X1.5 STRL (OR ONLY) (MISCELLANEOUS) ×1 IMPLANT
NEEDLE INSUFFLATION 14GA 120MM (NEEDLE) ×1 IMPLANT
PAD ARMBOARD POSITIONER FOAM (MISCELLANEOUS) ×2 IMPLANT
POUCH LAPAROSCOPIC INSTRUMENT (MISCELLANEOUS) ×1 IMPLANT
POWDER SURGICEL 3.0 GRAM (HEMOSTASIS) IMPLANT
RELOAD STAPLE 45 2.6 WHT THIN (STAPLE) IMPLANT
RELOAD STAPLE 45 3.6 BLU REG (STAPLE) ×1 IMPLANT
SCISSORS LAP 5X35 DISP (ENDOMECHANICALS) IMPLANT
SET TUBE SMOKE EVAC HIGH FLOW (TUBING) ×1 IMPLANT
SLEEVE Z-THREAD 5X100MM (TROCAR) ×1 IMPLANT
SOLN 0.9% NACL POUR BTL 1000ML (IV SOLUTION) ×1 IMPLANT
SOLN STERILE WATER BTL 1000 ML (IV SOLUTION) ×1 IMPLANT
STAPLER ECHELON POWERED (MISCELLANEOUS) IMPLANT
STAPLER POWER ECHELON 45 WIDE (STAPLE) ×1 IMPLANT
SUT MNCRL AB 4-0 PS2 18 (SUTURE) ×1 IMPLANT
SUT VICRYL 0 UR6 27IN ABS (SUTURE) IMPLANT
SYSTEM BAG RETRIEVAL 10MM (BASKET) ×1 IMPLANT
TIP ENDOSCOPIC SURGICEL (TIP) IMPLANT
TOWEL GREEN STERILE FF (TOWEL DISPOSABLE) ×1 IMPLANT
TRAY FOLEY W/BAG SLVR 16FR ST (SET/KITS/TRAYS/PACK) IMPLANT
TRAY LAPAROSCOPIC MC (CUSTOM PROCEDURE TRAY) ×1 IMPLANT
TROCAR Z THREAD OPTICAL 12X100 (TROCAR) ×1 IMPLANT
TROCAR Z-THREAD OPTICAL 5X100M (TROCAR) ×1 IMPLANT
WARMER LAPAROSCOPE (MISCELLANEOUS) ×1 IMPLANT

## 2024-02-13 NOTE — Anesthesia Procedure Notes (Signed)
 Procedure Name: Intubation Date/Time: 02/13/2024 9:28 AM  Performed by: Lockie Flesher, CRNAPre-anesthesia Checklist: Patient identified, Emergency Drugs available, Suction available and Patient being monitored Patient Re-evaluated:Patient Re-evaluated prior to induction Oxygen Delivery Method: Circle System Utilized Preoxygenation: Pre-oxygenation with 100% oxygen Induction Type: IV induction Ventilation: Mask ventilation without difficulty Laryngoscope Size: Mac and 3 Grade View: Grade II Tube type: Oral Tube size: 7.0 mm Number of attempts: 1 Airway Equipment and Method: Stylet and Oral airway Placement Confirmation: ETT inserted through vocal cords under direct vision, positive ETCO2 and breath sounds checked- equal and bilateral Secured at: 22 cm Tube secured with: Tape Dental Injury: Teeth and Oropharynx as per pre-operative assessment

## 2024-02-13 NOTE — ED Notes (Signed)
 Patient requested additional pain medication to [facility], nurse tech. I spoke with Dr. Dasie. RBVO Morphine  4 mg IV now. [facility] en route for patient per [place].

## 2024-02-13 NOTE — Op Note (Signed)
 Date of Surgery: 02/13/2024 Admit Date: 02/12/2024 Performing Service: General Surgeons and Role:    DEWAINE Ann Fine, MD - Primary  Preoperative Diagnosis: Acute appendicitis  Postoperative Diagnosis: acute appendicitis  Procedure(s) Performed: Laparoscopic appendectomy  Surgeon: Orie Ann, MD  Anesthesia: General endotracheal.  Specimens: Appendix  EBL:  Minimal.  Operative Findings: Inflamed appendix with appendicoliths. No evidence of perforation.  Procedure:  After obtaining consent from the patient, the patient was taken to the operating room and laid supine on the operating table.  The patient was then placed under general endotracheal anesthesia.  Patient voided on call to OR and no foley was placed.  The anterior abdominal wall was prepped and draped in the usual standard fashion. Antibiotics were administered. A surgical timeout was performed and confirmed our plan.  A small incision was made in the LUQ at Palmer's point and a veress needle was inserted. Air was aspirated and subsequent positive drop test. Abdomen then insufflated to . A periumbilical incision was then made and a 5mm trocar optiview using a 30 degree scope was inserted and the abdomen was entered under direct visualization. Inspection confirmed no evidence of trocar or veress site complications. The veress was then removed.   Diagnostic laparoscopy was performed.  There was no evidence of injury.  There was evidence of inflammation in the right lower quadrant.  A 12mm supraumbilical port was placed in the LLQ in the usual standard fashion under direction visualization. A suprapubic 5 mm trocar was then placed in the usual standard fashion under direct vision. The patient was placed in a head down, left side down position.  The cecum was identified; this was lifted up.  The terminal ileum was identified.  The appendix came into full view.  The appendix was then grasped and retracted in a cephalad  manner.  The mesoappendix was then clearly visualized.  The mesoappendix was divided up to the base of the appendix using a [place]  ligasure. There was an appendicolith palpated in mid appendix as well as one near base. The appendicolith at base was milked retrograde into cecum and base of appendix palpated and soft. A bowel load stapler was then fired across the base of the appendix. The appendix was then placed into an Endocatch bag. At this time, we then looked at the pelvis and reactive fluid was noted and suctioned. We then turned our attention back to the staple line and divided mesoappendix. There was some minimal ooze on staple line and decision made to place some surgicel powder along staple line. Staple line then hemostatic. The appendix was then removed through the 12 mm port site with the trocar. The 12 mm trocar site fascia was closed using 0 Vicryl on a suture passer under direct visualization. The suprapubic trocar site was removed under direct vision.  The abdomen was desufflated and the umbilical port site removed.   Skin of all trocar sites were closed using 4-0 Monocryl in a subcuticular manner and Dermabond.   The patient was reversed from general endotracheal anesthesia, extubated and sent to PACU in stable condition.  Instrument, sponge, and needle counts were correct at closure and at the conclusion of the case.    Orie Ann, MD [facility]

## 2024-02-13 NOTE — Transfer of Care (Signed)
 Immediate Anesthesia Transfer of Care Note  Patient: Jeanette Rodriguez  Procedure(s) Performed: APPENDECTOMY, LAPAROSCOPIC (Abdomen)  Patient Location: PACU  Anesthesia Type:General  Level of Consciousness: awake, alert , and oriented  Airway & Oxygen Therapy: Patient Spontanous Breathing and Patient connected to nasal cannula oxygen  Post-op Assessment: Report given to RN and Post -op Vital signs reviewed and stable  Post vital signs: Reviewed and stable  Last Vitals:  Vitals Value Taken Time  BP 182/82 02/13/24 10:39  Temp 36.7 C 02/13/24 10:39  Pulse 98 02/13/24 10:41  Resp 18 02/13/24 10:41  SpO2 97 % 02/13/24 10:41  Vitals shown include unfiled device data.  Last Pain:  Vitals:   02/13/24 0844  TempSrc: Oral  PainSc:          Complications: No notable events documented.

## 2024-02-13 NOTE — Anesthesia Preprocedure Evaluation (Addendum)
"                                    Anesthesia Evaluation  Patient identified by MRN, date of birth, ID band Patient awake    Reviewed: Allergy & Precautions, NPO status , Patient's Chart, lab work & pertinent test results  History of Anesthesia Complications Negative for: history of anesthetic complications  Airway Mallampati: II  TM Distance: >3 FB Neck ROM: Full    Dental  (+) Caps, Dental Advisory Given   Pulmonary asthma , COPD (last inhaler a week ago), former smoker   breath sounds clear to auscultation       Cardiovascular hypertension, Pt. on medications and Pt. on home beta blockers + CAD (non-obstructive by cath)   Rhythm:Regular Rate:Normal  07/2023 ECHO: EF 55 to 60%.  1.LV EF 55 %. The LV has normal function, no regional wall motion abnormalities. Left ventricular diastolic parameters were normal. The average left ventricular global longitudinal strain is -18.5 %. The global longitudinal strain is normal.   2. RVF is normal. The right ventricular size is mildly enlarged.   3. The mitral valve is normal in structure. Trivial mitral valve regurgitation. No evidence of mitral stenosis.   4. The aortic valve is tricuspid. There is moderate calcification of the aortic valve. There is mild thickening of the aortic valve. Aortic valve regurgitation is not visualized. Aortic valve sclerosis/calcification is present, without any evidence of aortic stenosis.     Neuro/Psych   Anxiety Depression    negative neurological ROS     GI/Hepatic Neg liver ROS,GERD  ,,N/v with acute appy   Endo/Other  Wegovy : last shot 02/08/2024  Renal/GU negative Renal ROS     Musculoskeletal   Abdominal   Peds  Hematology negative hematology ROS (+)   Anesthesia Other Findings   Reproductive/Obstetrics                              Anesthesia Physical Anesthesia Plan  ASA: 3  Anesthesia Plan: General   Post-op Pain Management: Ofirmev  IV  (intra-op)*   Induction: Intravenous and Rapid sequence  PONV Risk Score and Plan: 3 and Ondansetron , Dexamethasone  and Treatment may vary due to age or medical condition  Airway Management Planned: Oral ETT  Additional Equipment: None  Intra-op Plan:   Post-operative Plan: Extubation in OR  Informed Consent: I have reviewed the patients History and Physical, chart, labs and discussed the procedure including the risks, benefits and alternatives for the proposed anesthesia with the patient or authorized representative who has indicated his/her understanding and acceptance.     Dental advisory given  Plan Discussed with: CRNA and Surgeon  Anesthesia Plan Comments:          Anesthesia Quick Evaluation  "

## 2024-02-13 NOTE — Anesthesia Postprocedure Evaluation (Signed)
"   Anesthesia Post Note  Patient: Tarry Chol  Procedure(s) Performed: APPENDECTOMY, LAPAROSCOPIC (Abdomen)     Patient location during evaluation: PACU Anesthesia Type: General Level of consciousness: awake and alert, patient cooperative and oriented Pain management: pain level controlled Vital Signs Assessment: post-procedure vital signs reviewed and stable Respiratory status: spontaneous breathing, nonlabored ventilation and respiratory function stable Cardiovascular status: blood pressure returned to baseline and stable Postop Assessment: no apparent nausea or vomiting Anesthetic complications: no   No notable events documented.  Last Vitals:  Vitals:   02/13/24 1100 02/13/24 1115  BP: 136/65 (!) 143/69  Pulse: 99 93  Resp: (!) 25 19  Temp:    SpO2: 92% 94%    Last Pain:  Vitals:   02/13/24 1115  TempSrc:   PainSc: 0-No pain                 Maisy Newport,E. Tyrah Broers      "

## 2024-02-13 NOTE — ED Notes (Signed)
 Patient transported to CT

## 2024-02-13 NOTE — ED Notes (Signed)
 Patient scheduled for 10am surgery at [facility]

## 2024-02-13 NOTE — H&P (Signed)
 "    HPI  Jeanette Rodriguez is an 68 y.o. female with hx of CAD, HLD, osteoarthritis and HTN who presented to ED yesterday after abdominal pain started around 4pm.  She has had nausea/emesis and diarrhea. No prior abdominal surgeries. Denies fevers, states she has had some chills. Pain primarily periumbilical and suprapubic, some RLQ pain. On CT scan she has appendicitis with appendicolith at base. WBC 13.3.  Not on blood thinners  10 point review of systems is negative except as listed above in HPI.  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 [place]    Hypertension    Pneumonia     Past Surgical History: Past Surgical History:  Procedure Laterality Date   ANKLE SURGERY     x3 right   2007, 2009,2011   LEFT HEART CATH AND CORONARY ANGIOGRAPHY N/A 06/20/2023   Procedure: LEFT HEART CATH AND CORONARY ANGIOGRAPHY;  Surgeon: Ladona Heinz, MD;  Location: [facility] INVASIVE [facility];  Service: Cardiovascular;  Laterality: N/A;   REVERSE SHOULDER ARTHROPLASTY Right 06/02/2022   Procedure: REVERSE SHOULDER ARTHROPLASTY;  Surgeon: Melita Drivers, MD;  Location: [facility] ORS;  Service: Orthopedics;  Laterality: Right;   SINUSOTOMY  04/01/2022   TONSILLECTOMY     TOTAL KNEE ARTHROPLASTY Right 08/03/2020   Procedure: TOTAL KNEE ARTHROPLASTY;  Surgeon: Melodi Lerner, MD;  Location: [facility] ORS;  Service: Orthopedics;  Laterality: Right;    TOTAL KNEE ARTHROPLASTY Left 08/02/2021   Procedure: TOTAL KNEE ARTHROPLASTY;  Surgeon: Melodi Lerner, MD;  Location: [facility] OR[facility];  Service: Orthopedics;  Laterality: Left;    Family History:  Family History  Problem Relation Age of Onset   Hyperlipidemia Mother    Valvular heart disease Mother    Heart attack Father    Hyperlipidemia Brother    Heart attack Maternal Grandmother     Heart attack Maternal Grandfather    Heart attack Paternal Grandmother    Heart attack Paternal Grandfather     Social History:  reports that she has quit smoking. Her smoking use included cigarettes. She has been exposed to tobacco smoke. She has never used smokeless tobacco. She reports that she does not drink alcohol and does not use drugs.  Allergies: Allergies[1]  Medications: I have reviewed the patient's current medications.  Labs: I have personally reviewed all labs for the past 24h  Imaging: I have personally reviewed and interpreted all imaging for the past 24h and agree with the radiologist's impression.  CT ABDOMEN PELVIS W CONTRAST Result Date: 02/13/2024 CLINICAL DATA:  Abdominal pain, nausea and vomiting. EXAM: CT ABDOMEN AND PELVIS WITH CONTRAST TECHNIQUE: Multidetector CT imaging of the abdomen and pelvis was performed using the standard protocol following bolus administration of intravenous contrast. RADIATION DOSE REDUCTION: This exam was performed according to the departmental dose-optimization program which includes automated exposure control, adjustment of the mA and/or kV according to patient size and/or use of iterative reconstruction technique. CONTRAST:  OMNIPAQUE  IOHEXOL  300 MG/ML  SOLN COMPARISON:  None Available. FINDINGS: Lower chest: No basilar airspace disease or pleural effusion. Hepatobiliary: Vague 14 mm hypodense subcapsular lesion in the left lobe of the liver, series 2, image 18. Focal fatty infiltration adjacent to the falciform ligament. Suspect noncalcified gallstones in the gallbladder. No pericholecystic  inflammation. Gallbladder has a Phrygian cap. No biliary dilatation. Pancreas: No ductal dilatation or inflammation. Spleen: Normal in size without focal abnormality. Adrenals/Urinary Tract: Normal adrenal glands no hydronephrosis. 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 No renal stones. Unremarkable urinary bladder. Stomach/Bowel: The  appendix is dilated at 10 mm, fluid-filled with mild wall hyperemia, series 2, image 58. There is a 7 mm appendicolith at the base of the appendix. No periappendiceal fluid collection or perforation. The stomach is nondistended. No small bowel obstruction or inflammatory change. Small to moderate volume of stool throughout the colon. Vascular/Lymphatic: Aortic atherosclerosis without aneurysm. Patent portal, splenic and mesenteric veins. No bulky abdominopelvic adenopathy. Reproductive: Uterus and bilateral adnexa are unremarkable. Other: Minimal free fluid in the pelvis. No organized collection. No free air. Small fat containing umbilical hernia. Musculoskeletal: Degenerative change throughout the lumbar spine. There are no acute or suspicious osseous abnormalities. IMPRESSION: 1. Acute uncomplicated appendicitis. 2. Vague 14 mm hypodense subcapsular lesion in the left lobe of the liver. Recommend nonemergent hepatic protocol MRI for characterization. 3. Suspect noncalcified gallstones in the gallbladder. No pericholecystic inflammation. 4. 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 Aortic Atherosclerosis (ICD10-I70.0). Electronically Signed   By: Andrea Gasman M.D.   On: 02/13/2024 00:51   DG Chest 2 View Result Date: 02/12/2024 CLINICAL DATA:  Abdominal pain. EXAM: DG CHEST 2V COMPARISON:  03/22/2016 FINDINGS: The cardiomediastinal contours are normal. The lungs are clear. Pulmonary vasculature is normal. No consolidation, pleural effusion, or pneumothorax. No acute osseous abnormalities are seen. Reverse right shoulder arthroplasty. IMPRESSION: No active cardiopulmonary disease. Electronically Signed   By: Andrea Gasman M.D.   On: 02/12/2024 23:57     Physical Exam Blood pressure (!) 110/95, pulse 83, temperature 98.3 F (36.8 C), temperature source Oral, resp. rate 17, SpO2 95%. General: tearful due to anxiety but significant distress HEENT: normocephalic,  atraumatic Oropharynx: mucous membranes dry CV: Regular rate and rhythm, normotensive Chest: equal chest rise bilaterally normal respiratory effort on room air Abdomen: soft and tender to palpation in suprapubic/periumbilical region and RLQ without rebound or guarding Extremities: moves all extremities Skin: warm, dry, no rashes Psych: normal memory, normal mood/affect  Neuro: No focal neurologic deficits, A&Ox3    Assessment   Jeanette Rodriguez is an 68 y.o. female with acute appendicitis  Plan  - Proceed to OR for laparoscopic appendectomy - The pathophysiology of appendicitis was discussed with the patient.  Natural history risks without surgery was discussed.   I feel the risks of no intervention outweigh the operative risks; therefore, I recommended diagnostic laparoscopy with appendectomy.  Laparoscopic & open techniques were discussed. Risks including but not limited to: bleeding, infection, abscess, leak, reoperation, injury to other organs, need for repair of tissues / organs, possible ileocecectomy, possible conversion to open operation, possible need for drain, hernia, and post-op ileus were discussed. Goals of post-operative recovery were discussed as well.  Questions were answered.  The patient expresses understanding & wishes to proceed with surgery.   I reviewed ED provider notes, last 24 h vitals and pain scores, last 48 h intake and output, last 24 h labs and trends, and last 24 h imaging results.  This care required high  level of medical decision making.   Orie Silversmith, MD General Surgery, Surgical Critical Care and Trauma       [1]  Allergies Allergen Reactions   Atorvastatin     Myalgias   Levofloxacin Swelling    Leg swelling  Other Reaction(s): joint  pains   Pitavastatin    Statins     Other Reaction(s): body aches, Not available   "

## 2024-02-14 ENCOUNTER — Encounter (HOSPITAL_COMMUNITY): Payer: Self-pay | Admitting: General Surgery

## 2024-02-14 LAB — SURGICAL PATHOLOGY

## 2024-02-17 ENCOUNTER — Encounter (HOSPITAL_BASED_OUTPATIENT_CLINIC_OR_DEPARTMENT_OTHER): Payer: Self-pay

## 2024-02-19 ENCOUNTER — Ambulatory Visit (HOSPITAL_BASED_OUTPATIENT_CLINIC_OR_DEPARTMENT_OTHER): Admitting: Family

## 2024-03-01 ENCOUNTER — Telehealth: Payer: Self-pay

## 2024-03-01 NOTE — Telephone Encounter (Signed)
 Auth Submission: NO AUTH NEEDED Site of care: Site of care: [facility] Payer: Medicare A/B with BCBS FEP Medication & CPT/J Code(s) submitted: Leqvio  (Inclisiran) J1306 Diagnosis Code:  Route of submission (phone, fax, portal):  Phone # Fax # Auth type: Buy/Bill PB Units/visits requested: 284mg  x 2 doses Reference number:  Approval from: 03/01/24 to 03/23/25

## 2024-03-09 ENCOUNTER — Other Ambulatory Visit (HOSPITAL_BASED_OUTPATIENT_CLINIC_OR_DEPARTMENT_OTHER): Payer: Self-pay

## 2024-03-10 ENCOUNTER — Encounter (HOSPITAL_BASED_OUTPATIENT_CLINIC_OR_DEPARTMENT_OTHER): Payer: Self-pay | Admitting: Cardiovascular Disease

## 2024-03-11 ENCOUNTER — Telehealth (HOSPITAL_BASED_OUTPATIENT_CLINIC_OR_DEPARTMENT_OTHER): Payer: Self-pay | Admitting: *Deleted

## 2024-03-11 ENCOUNTER — Other Ambulatory Visit (HOSPITAL_BASED_OUTPATIENT_CLINIC_OR_DEPARTMENT_OTHER): Payer: Self-pay

## 2024-03-11 MED ORDER — OXYCODONE-ACETAMINOPHEN 10-325 MG PO TABS
1.0000 | ORAL_TABLET | Freq: Four times a day (QID) | ORAL | 0 refills | Status: AC | PRN
  Filled 2024-03-11 – 2024-03-13 (×2): qty 120, 30d supply, fill #0

## 2024-03-11 NOTE — Telephone Encounter (Signed)
 Pt has been scheduled tele preop appt add on 03/14/24 due to procedure date and med hold. Med rec and consent are done.

## 2024-03-11 NOTE — Telephone Encounter (Signed)
" ° °  Name: Jeanette Rodriguez  DOB: 1955-03-10  MRN: [id number]  Primary Cardiologist: None   Preoperative team, please contact this patient and set up a phone call appointment for further preoperative risk assessment. Please obtain consent and complete medication review. Thank you for your help.  I confirm that guidance regarding antiplatelet and oral anticoagulation therapy has been completed and, if necessary, noted below.  Regarding ASA therapy, we recommend continuation of ASA throughout the perioperative period.  However, if the surgeon feels that cessation of ASA is required in the perioperative period, it may be stopped 5-7 days prior to surgery with a plan to resume it as soon as felt to be feasible from a surgical standpoint in the post-operative period.   I also confirmed the patient resides in the state of [place] . As per [facility] Medical Board telemedicine laws, the patient must reside in the state in which the provider is licensed.   Lamarr Satterfield, NP 03/11/2024, 4:31 PM [facility]    "

## 2024-03-11 NOTE — Telephone Encounter (Signed)
"  ° °  Pre-operative Risk Assessment    Patient Name: Jeanette Rodriguez  DOB: 07-May-1955 MRN: [id number]   Date of last office visit: 12/13/23 ROSALINE BANE, NP Date of next office visit: NONE   Request for Surgical Clearance    Procedure:  LEFT KNEE SCOPE  Date of Surgery:  Clearance 03/19/24                                Surgeon:  DR. DEMPSEY MOAN Surgeon's Group or Practice Name:  [facility] Phone number:  [phone] KERRI MAZE Fax number:  [phone]   Type of Clearance Requested:   - Medical  - Pharmacy:  Hold Aspirin      Type of Anesthesia:  CHOICE   Additional requests/questions:    Bonney Niels Jest   03/11/2024, 3:54 PM   "

## 2024-03-11 NOTE — Telephone Encounter (Signed)
" ° °  Name: Jeanette Rodriguez  DOB: [birth year]  MRN: [id number]  Primary Cardiologist: None   Preoperative team, please contact this patient and set up a phone call appointment for further preoperative risk assessment. Please obtain consent and complete medication review. Thank you for your help.  I confirm that guidance regarding antiplatelet and oral anticoagulation therapy has been completed and, if necessary, noted below.  Patient is not on anticoagulation or antiplatelet per review of current medical record in Epic.    I also confirmed the patient resides in the state of [place] . As per [facility] Medical Board telemedicine laws, the patient must reside in the state in which the provider is licensed.   Lamarr Satterfield, NP 03/11/2024, 4:07 PM  [facility]    "

## 2024-03-11 NOTE — Telephone Encounter (Signed)
 Pt has been scheduled tele preop appt add on 03/14/24 due to procedure date and med hold. Med rec and consent are done.      Patient Consent for Virtual Visit        Jeanette Rodriguez has provided verbal consent on 03/11/2024 for a virtual visit (video or telephone).   CONSENT FOR VIRTUAL VISIT FOR:  Jeanette Rodriguez  By participating in this virtual visit I agree to the following:  I hereby voluntarily request, consent and authorize [facility] and its employed or contracted physicians, physician assistants, nurse practitioners or other licensed health care professionals (the Practitioner), to provide me with telemedicine health care services (the Services) as deemed necessary by the treating Practitioner. I acknowledge and consent to receive the Services by the Practitioner via telemedicine. I understand that the telemedicine visit will involve communicating with the Practitioner through live audiovisual communication technology and the disclosure of certain medical information by electronic transmission. I acknowledge that I have been given the opportunity to request an in-person assessment or other available alternative prior to the telemedicine visit and am voluntarily participating in the telemedicine visit.  I understand that I have the right to withhold or withdraw my consent to the use of telemedicine in the course of my care at any time, without affecting my right to future care or treatment, and that the Practitioner or I may terminate the telemedicine visit at any time. I understand that I have the right to inspect all information obtained and/or recorded in the course of the telemedicine visit and may receive copies of available information for a reasonable fee.  I understand that some of the potential risks of receiving the Services via telemedicine include:  Delay or interruption in medical evaluation due to technological equipment failure or disruption; Information transmitted  may not be sufficient (e.g. poor resolution of images) to allow for appropriate medical decision making by the Practitioner; and/or  In rare instances, security protocols could fail, causing a breach of personal health information.  Furthermore, I acknowledge that it is my responsibility to provide information about my medical history, conditions and care that is complete and accurate to the best of my ability. I acknowledge that Practitioner's advice, recommendations, and/or decision may be based on factors not within their control, such as incomplete or inaccurate data provided by me or distortions of diagnostic images or specimens that may result from electronic transmissions. I understand that the practice of medicine is not an exact science and that Practitioner makes no warranties or guarantees regarding treatment outcomes. I acknowledge that a copy of this consent can be made available to me via my patient portal [facility] MyChart), or I can request a printed copy by calling the office of [facility].    I understand that my insurance will be billed for this visit.   I have read or had this consent read to me. I understand the contents of this consent, which adequately explains the benefits and risks of the Services being provided via telemedicine.  I have been provided ample opportunity to ask questions regarding this consent and the Services and have had my questions answered to my satisfaction. I give my informed consent for the services to be provided through the use of telemedicine in my medical care

## 2024-03-12 ENCOUNTER — Other Ambulatory Visit (HOSPITAL_BASED_OUTPATIENT_CLINIC_OR_DEPARTMENT_OTHER): Payer: Self-pay

## 2024-03-12 MED ORDER — DIAZEPAM 10 MG PO TABS
10.0000 mg | ORAL_TABLET | Freq: Every day | ORAL | 0 refills | Status: AC | PRN
  Filled 2024-03-12: qty 2, 2d supply, fill #0

## 2024-03-13 ENCOUNTER — Other Ambulatory Visit (HOSPITAL_BASED_OUTPATIENT_CLINIC_OR_DEPARTMENT_OTHER): Payer: Self-pay

## 2024-03-14 ENCOUNTER — Ambulatory Visit: Attending: Cardiology

## 2024-03-14 DIAGNOSIS — Z01818 Encounter for other preprocedural examination: Secondary | ICD-10-CM

## 2024-03-14 DIAGNOSIS — Z01810 Encounter for preprocedural cardiovascular examination: Secondary | ICD-10-CM | POA: Diagnosis not present

## 2024-03-14 NOTE — Progress Notes (Signed)
 "   Virtual Visit via Telephone Note   Because of Jeanette Rodriguez co-morbid illnesses, she is at least at moderate risk for complications without adequate follow up.  This format is felt to be most appropriate for this patient at this time.  Due to technical limitations with video connection (technology), today's appointment will be conducted as an audio only telehealth visit, and Jeanette Rodriguez verbally agreed to proceed in this manner.   All issues noted in this document were discussed and addressed.  No physical exam could be performed with this format.  Evaluation Performed:  Preoperative cardiovascular risk assessment _____________   Date:  03/14/2024   Patient ID:  Jeanette Rodriguez, DOB 11/09/1955, MRN [id number] Patient Location:  Home Provider location:   Office  Primary Care Provider:  Verena Mems, MD Primary Cardiologist:  None  Chief Complaint / Patient Profile   69 y.o. y/o female with a h/o CAD (70% mid LAD stenosis), aortic valve calcification, and hyperlipidemia with statin intolerance who is pending left knee scope and presents today for telephonic preoperative cardiovascular risk assessment.  History of Present Illness    Jeanette Rodriguez is a 69 y.o. female who presents via audio/video conferencing for a telehealth visit today.  Pt was last seen in cardiology clinic on 12/13/2023 by Rosaline Bane, NP. At that time Jeanette Rodriguez was doing well. The patient is now pending procedure as outlined above. Since her last visit, she had appendix removed and recovered well. She is still under weight restrictions until early February. She is still getting out and walking around in stores to continue being mobile.   She denies chest pain, shortness of breath, lower extremity edema, fatigue, palpitations, melena, hematuria, hemoptysis, diaphoresis, weakness, presyncope, syncope, orthopnea, and PND.  Past Medical History    Past Medical History:  Diagnosis Date   Anxiety    Arthritis     Asthma    Depression    Fibromyalgia    GERD (gastroesophageal reflux disease)    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 [place]    Hypertension    Pneumonia    Past Surgical History:  Procedure Laterality Date   ANKLE SURGERY     x3 right   2007, 2009,2011   LAPAROSCOPIC APPENDECTOMY N/A 02/13/2024   Procedure: APPENDECTOMY, LAPAROSCOPIC;  Surgeon: Ann Fine, MD;  Location: [facility] OR;  Service: General;  Laterality: N/A;   LEFT HEART CATH AND CORONARY ANGIOGRAPHY N/A 06/20/2023   Procedure: LEFT HEART CATH AND CORONARY ANGIOGRAPHY;  Surgeon: Ladona Heinz, MD;  Location: [facility] INVASIVE [facility];  Service: Cardiovascular;  Laterality: N/A;   REVERSE SHOULDER ARTHROPLASTY Right 06/02/2022   Procedure: REVERSE SHOULDER ARTHROPLASTY;  Surgeon: Melita Drivers, MD;  Location: [facility] ORS;  Service: Orthopedics;  Laterality: Right;   SINUSOTOMY  04/01/2022   TONSILLECTOMY     TOTAL KNEE ARTHROPLASTY Right 08/03/2020   Procedure: TOTAL KNEE ARTHROPLASTY;  Surgeon: Melodi Lerner, MD;  Location: [facility] ORS;  Service: Orthopedics;  Laterality: Right;    TOTAL KNEE ARTHROPLASTY Left 08/02/2021   Procedure: TOTAL KNEE ARTHROPLASTY;  Surgeon: Melodi Lerner, MD;  Location: [facility] ORS;  Service: Orthopedics;  Laterality: Left;    Allergies  Allergies[1]  Home Medications    Prior to Admission medications  Medication Sig Start Date End Date Taking? Authorizing Provider  albuterol  (PROVENTIL ) (2.5 MG/3ML) 0.083% nebulizer solution Take 2.5 mg by nebulization every 6 (six) hours as needed for wheezing or shortness of breath.    [provider]  amLODipine  (NORVASC ) 5 MG tablet Take 1 tablet (5 mg total) by mouth daily. 07/26/23 05/15/24  Raford Riggs, MD  aspirin  EC 81 MG tablet Take 81 mg by mouth daily. Swallow whole.    [provider]  cetirizine  (ZYRTEC ) 10 MG tablet Take 1 tablet (10 mg total) by mouth daily. 03/09/23     ciprofloxacin  (CILOXAN ) 0.3 % ophthalmic  solution Place 1-2 drops into the affected eye(s) 4 (four) times daily. 09/07/23     clotrimazole-betamethasone (LOTRISONE) cream Apply 1 Application topically daily as needed (Rash).    [provider]  diazepam  (VALIUM ) 10 MG tablet Take 1 tablet (10 mg total) by mouth daily as needed for CT scan. 03/12/24     diazepam  (VALIUM ) 5 MG tablet TAKE 1 TABLET NIGHT BEFORE AND MORNING OF PROCEDURE 06/16/23   Raford Riggs, MD  diclofenac  Sodium (VOLTAREN ) 1 % GEL APPLY 2 GRAMS TO THE AFFECTED AREA(S) BY TOPICAL ROUTE 4 TIMES PER DAY Patient taking differently: Apply 2 g topically daily as needed (Pain). 12/29/22     DULoxetine  (CYMBALTA ) 30 MG capsule Take 1 capsule (30 mg total) by mouth daily. 01/26/24     DULoxetine  (CYMBALTA ) 60 MG capsule Take 1 capsule (60 mg total) by mouth daily. 01/12/23     ezetimibe  (ZETIA ) 10 MG tablet Take 1 tablet (10 mg total) by mouth daily. 07/26/23 07/25/24  Raford Riggs, MD  fluticasone  (FLONASE ) 50 MCG/ACT nasal spray Place 1 spray into both nostrils daily. 11/02/23     inclisiran (LEQVIO ) 284 MG/1.5ML SOSY injection Inject 284 mg into the skin as directed. Every 6 months    [provider]  Ketotifen Fumarate (ALLERGY EYE DROPS OP) Place 1 drop into both eyes daily.    [provider]  Magnesium 100 MG TABS Take 100 mg by mouth at bedtime.    [provider]  methocarbamol  (ROBAXIN ) 500 MG tablet Take 1 tablet (500 mg total) by mouth 2 (two) times daily as needed. 10/07/23   Bernis Ernst, PA-C  metoprolol  succinate (TOPROL -XL) 25 MG 24 hr tablet Take 1 tablet (25 mg total) by mouth daily. Take with or immediately following a meal. 01/31/24   Raford Riggs, MD  montelukast  (SINGULAIR ) 10 MG tablet Take 1 tablet (10 mg total) by mouth daily. 10/10/22     Multiple Vitamins-Minerals (MULTIVITAMIN WITH MINERALS) tablet Take 1 tablet by mouth daily.    [provider]  nitroGLYCERIN  (NITROSTAT ) 0.4 MG SL tablet Place 1 tablet  (0.4 mg total) under the tongue every 5 (five) minutes as needed for chest pain. 06/20/23 06/19/24  Ladona Heinz, MD  oxyCODONE -acetaminophen  (PERCOCET) 10-325 MG tablet Take 1 tablet by mouth 4 (four) times daily as needed. 03/11/24     semaglutide -weight management (WEGOVY ) 2.4 MG/0.75ML SOAJ SQ injection Inject 2.4 mg into the skin once a week. 09/18/23       Physical Exam    Vital Signs:  Jeanette Rodriguez does not have vital signs available for review today.  Given telephonic nature of communication, physical exam is limited. AAOx3. NAD. Normal affect.  Speech and respirations are unlabored.  Accessory Clinical Findings    None  Assessment & Plan    1.  Preoperative Cardiovascular Risk Assessment:  The patient was advised that if she develops new symptoms prior to surgery to contact our office to arrange for a follow-up visit, and she verbalized understanding.  According to the Revised Cardiac Risk Index (RCRI), her Perioperative Risk of Major Cardiac Event is (%):  0.4 Her Functional Capacity in METs is: 4.64 according to the Duke Activity Status Index (DASI). Per AHA/ACC guidelines, she is deemed acceptable risk for the planned procedure without additional cardiovascular testing. Will route to surgical team so they are aware.   Regarding ASA therapy, we recommend continuation of ASA throughout the perioperative period.  However, if the surgeon feels that cessation of ASA is required in the perioperative period, it may be stopped 5-7 days prior to surgery with a plan to resume it as soon as felt to be feasible from a surgical standpoint in the post-operative period.  A copy of this note will be routed to requesting surgeon.  Time:   Today, I have spent 10 minutes with the patient with telehealth technology discussing medical history, symptoms, and management plan.     Jeanette KATHEE Pizza, FNP  03/14/2024, 10:52 AM     [1]  Allergies Allergen Reactions   Atorvastatin     Myalgias    Levofloxacin Swelling    Leg swelling  Other Reaction(s): joint pains   Pitavastatin    Statins     Other Reaction(s): body aches, Not available   "

## 2024-03-15 ENCOUNTER — Other Ambulatory Visit (HOSPITAL_BASED_OUTPATIENT_CLINIC_OR_DEPARTMENT_OTHER): Payer: Self-pay

## 2024-03-15 MED ORDER — METHOCARBAMOL 500 MG PO TABS
500.0000 mg | ORAL_TABLET | Freq: Four times a day (QID) | ORAL | 0 refills | Status: AC | PRN
  Filled 2024-03-15: qty 40, 10d supply, fill #0

## 2024-03-15 MED ORDER — ASPIRIN 81 MG PO TBEC
81.0000 mg | DELAYED_RELEASE_TABLET | Freq: Every day | ORAL | 0 refills | Status: AC
  Filled 2024-03-15: qty 21, 21d supply, fill #0

## 2024-05-02 ENCOUNTER — Ambulatory Visit
